# Patient Record
Sex: Male | Born: 1945 | Race: Black or African American | Hispanic: No | Marital: Married | Smoking: Former smoker
Health system: Southern US, Community
[De-identification: ages and names within clinical notes are randomized; demographics above are authoritative.]

## PROBLEM LIST (undated history)

## (undated) DIAGNOSIS — I10 Essential (primary) hypertension: Secondary | ICD-10-CM

## (undated) DIAGNOSIS — C159 Malignant neoplasm of esophagus, unspecified: Principal | ICD-10-CM

## (undated) DIAGNOSIS — I639 Cerebral infarction, unspecified: Secondary | ICD-10-CM

## (undated) DIAGNOSIS — E785 Hyperlipidemia, unspecified: Secondary | ICD-10-CM

## (undated) HISTORY — DX: Malignant neoplasm of esophagus, unspecified: C15.9

## (undated) HISTORY — PX: TONSILLECTOMY: SUR1361

## (undated) HISTORY — PX: PEG TUBE PLACEMENT: SUR1034

---

## 2000-07-17 ENCOUNTER — Emergency Department (HOSPITAL_COMMUNITY): Admission: EM | Admit: 2000-07-17 | Discharge: 2000-07-17 | Payer: Self-pay | Admitting: *Deleted

## 2012-09-06 ENCOUNTER — Encounter (HOSPITAL_COMMUNITY): Payer: Self-pay

## 2012-09-06 ENCOUNTER — Inpatient Hospital Stay (HOSPITAL_COMMUNITY): Payer: Medicare Other

## 2012-09-06 ENCOUNTER — Inpatient Hospital Stay (HOSPITAL_COMMUNITY)
Admission: EM | Admit: 2012-09-06 | Discharge: 2012-09-11 | DRG: 065 | Disposition: A | Discharge status: Skilled Nursing Facility | Payer: Medicare Other | Attending: Internal Medicine | Admitting: Internal Medicine

## 2012-09-06 ENCOUNTER — Emergency Department (HOSPITAL_COMMUNITY): Payer: Medicare Other

## 2012-09-06 ENCOUNTER — Other Ambulatory Visit: Payer: Self-pay

## 2012-09-06 DIAGNOSIS — F17200 Nicotine dependence, unspecified, uncomplicated: Secondary | ICD-10-CM | POA: Diagnosis present

## 2012-09-06 DIAGNOSIS — E538 Deficiency of other specified B group vitamins: Secondary | ICD-10-CM | POA: Diagnosis present

## 2012-09-06 DIAGNOSIS — I6350 Cerebral infarction due to unspecified occlusion or stenosis of unspecified cerebral artery: Principal | ICD-10-CM

## 2012-09-06 DIAGNOSIS — Z720 Tobacco use: Secondary | ICD-10-CM | POA: Diagnosis present

## 2012-09-06 DIAGNOSIS — G8100 Flaccid hemiplegia affecting unspecified side: Secondary | ICD-10-CM | POA: Diagnosis present

## 2012-09-06 DIAGNOSIS — F1010 Alcohol abuse, uncomplicated: Secondary | ICD-10-CM | POA: Diagnosis present

## 2012-09-06 DIAGNOSIS — I517 Cardiomegaly: Secondary | ICD-10-CM

## 2012-09-06 DIAGNOSIS — I639 Cerebral infarction, unspecified: Secondary | ICD-10-CM | POA: Diagnosis present

## 2012-09-06 DIAGNOSIS — H538 Other visual disturbances: Secondary | ICD-10-CM

## 2012-09-06 DIAGNOSIS — I10 Essential (primary) hypertension: Secondary | ICD-10-CM | POA: Diagnosis present

## 2012-09-06 LAB — CBC WITH DIFFERENTIAL/PLATELET
Eosinophils Relative: 0 % (ref 0–5)
Hemoglobin: 15.7 g/dL (ref 13.0–17.0)
Lymphocytes Relative: 15 % (ref 12–46)
Lymphs Abs: 1.3 10*3/uL (ref 0.7–4.0)
MCV: 103.9 fL — ABNORMAL HIGH (ref 78.0–100.0)
Monocytes Relative: 12 % (ref 3–12)
Neutrophils Relative %: 73 % (ref 43–77)
Platelets: 232 10*3/uL (ref 150–400)
RBC: 4.39 MIL/uL (ref 4.22–5.81)
WBC: 8.3 10*3/uL (ref 4.0–10.5)

## 2012-09-06 LAB — BASIC METABOLIC PANEL
CO2: 27 mEq/L (ref 19–32)
Glucose, Bld: 131 mg/dL — ABNORMAL HIGH (ref 70–99)
Potassium: 3.9 mEq/L (ref 3.5–5.1)
Sodium: 139 mEq/L (ref 135–145)

## 2012-09-06 LAB — PHOSPHORUS: Phosphorus: 3.3 mg/dL (ref 2.3–4.6)

## 2012-09-06 LAB — CK: Total CK: 280 U/L — ABNORMAL HIGH (ref 7–232)

## 2012-09-06 LAB — RAPID URINE DRUG SCREEN, HOSP PERFORMED
Cocaine: NOT DETECTED
Opiates: NOT DETECTED
Tetrahydrocannabinol: NOT DETECTED

## 2012-09-06 LAB — GLUCOSE, CAPILLARY
Glucose-Capillary: 117 mg/dL — ABNORMAL HIGH (ref 70–99)
Glucose-Capillary: 169 mg/dL — ABNORMAL HIGH (ref 70–99)

## 2012-09-06 MED ORDER — LORAZEPAM 2 MG/ML IJ SOLN: 1.0000 mg | Freq: Four times a day (QID) | INTRAMUSCULAR | Status: AC | PRN

## 2012-09-06 MED ORDER — THIAMINE HCL 100 MG/ML IJ SOLN: 100.0000 mg | Freq: Every day | INTRAMUSCULAR | Status: DC

## 2012-09-06 MED ORDER — NICOTINE 7 MG/24HR TD PT24
7.0000 mg | MEDICATED_PATCH | Freq: Every day | TRANSDERMAL | Status: DC
  Administered 2012-09-06 – 2012-09-11 (×6): 7 mg via TRANSDERMAL
  Filled 2012-09-06 (×8): qty 1

## 2012-09-06 MED ORDER — ASPIRIN 325 MG PO TABS
325.0000 mg | ORAL_TABLET | Freq: Every day | ORAL | Status: DC
  Administered 2012-09-06 – 2012-09-11 (×6): 325 mg via ORAL
  Filled 2012-09-06 (×6): qty 1

## 2012-09-06 MED ORDER — HEPARIN SODIUM (PORCINE) 5000 UNIT/ML IJ SOLN
5000.0000 [IU] | Freq: Three times a day (TID) | INTRAMUSCULAR | Status: DC
  Administered 2012-09-06 – 2012-09-11 (×16): 5000 [IU] via SUBCUTANEOUS
  Filled 2012-09-06 (×16): qty 1

## 2012-09-06 MED ORDER — FOLIC ACID 1 MG PO TABS
1.0000 mg | ORAL_TABLET | Freq: Every day | ORAL | Status: DC
  Administered 2012-09-06 – 2012-09-11 (×6): 1 mg via ORAL
  Filled 2012-09-06 (×6): qty 1

## 2012-09-06 MED ORDER — LORAZEPAM 1 MG PO TABS
1.0000 mg | ORAL_TABLET | Freq: Four times a day (QID) | ORAL | Status: AC | PRN
  Administered 2012-09-07: 1 mg via ORAL
  Filled 2012-09-06: qty 1

## 2012-09-06 MED ORDER — SODIUM CHLORIDE 0.9 % IV SOLN
INTRAVENOUS | Status: DC
  Administered 2012-09-06 – 2012-09-10 (×5): via INTRAVENOUS

## 2012-09-06 MED ORDER — VITAMIN B-1 100 MG PO TABS
100.0000 mg | ORAL_TABLET | Freq: Every day | ORAL | Status: DC
  Administered 2012-09-06 – 2012-09-11 (×6): 100 mg via ORAL
  Filled 2012-09-06 (×6): qty 1

## 2012-09-06 MED ORDER — ADULT MULTIVITAMIN W/MINERALS CH
1.0000 | ORAL_TABLET | Freq: Every day | ORAL | Status: DC
  Administered 2012-09-06 – 2012-09-11 (×6): 1 via ORAL
  Filled 2012-09-06 (×6): qty 1

## 2012-09-06 NOTE — ED Notes (Signed)
Patient reports left sided weakness since awakening around 7 AM Tuesday morning. Patient reports he crawled around his house for two days. Daughter checks on patient every couple days and came to check on patient when a maintenance worker reported patient was in the floor of his home. Patient alert/oriented x 4. Speech slurred to daughter. Patient able to move fingers on left hand minimally, but not arm. Unable to move left leg. Patient with moderate to strong movement of right side. Denies any pain. Denies medical history. Hypertensive upon arrival.

## 2012-09-06 NOTE — H&P (Signed)
[facility] History and Physical  Billy Carson WUJ:[id number] DOB: 12/17/1945 DOA: 09/06/2012  Referring physician: Dr. Gwendolyn Carson PCP: Default, Provider, MD  Specialists: Neurology  Chief Complaint: Left-sided weakness  HPI: Billy Carson is a 67 y.o. male without known medical history, mainly because he does not see a doctor frequently, presents to the emergency room with a chief complaint of left-sided weakness for the past 2 days. States that 2 days ago he has noticed that he is unable to move his left arm and left leg. He did not seek help at that time and only talked about his symptoms with his wife. When he saw that his symptoms are not resolving, decided to present to the emergency room. He denies any past medical history, he does not have a primary care doctor. He is not taking any medications on a chronic basis at home. He has a history of tobacco abuse, has been smoking about a pack every 3 days for his life. Has a history of alcohol use, has been drinking a pint of vodka for all his life. Never had symptoms like this in the past. He denies any headaches, lightheadedness or dizziness. He denies any chest pain. He denies any shortness or breath. He denies any nausea vomiting or diarrhea. For all these started, he was pretty active without any deficits. In the emergency room, he underwent a CT scan of the head which was suspicious for subacute infarct in the lateral right basal ganglia without evidence of acute hemorrhage  Review of Systems: As per history of present illness, otherwise negative  History reviewed. No pertinent past medical history. History reviewed. No pertinent past surgical history. Social History:  reports that he has been smoking Cigarettes.  He has been smoking about 0.00 packs per day. He does not have any smokeless tobacco history on file. He reports that  drinks alcohol. He reports that he does not use illicit drugs.  No Known Allergies  Family  history noncontributory.  Prior to Admission medications   Not on File   Physical Exam: Filed Vitals:   09/06/12 1231  BP: 195/134  Pulse: 107  Temp: 99.6 F (37.6 C)  TempSrc: Oral  Resp: 18  Height: 6' (1.829 m)  Weight: 114.76 kg (253 lb)  SpO2: 99%     General:  No apparent distress, pleasant African American male  Eyes: PERRL, EOMI, no scleral icterus  ENT: moist oropharynx  Neck: supple, no JVD  Cardiovascular: regular rate without MRG; 2+ peripheral pulses  Respiratory: CTA biL, good air movement without wheezing, rhonchi or crackled  Abdomen: soft, non tender to palpation, positive bowel sounds, no guarding, no rebound  Skin: no rashes  Musculoskeletal: no peripheral edema  Psychiatric: normal mood and affect  Neurologic: Significant left upper and left lower extremity weakness, 1/5. No facial droop appreciated. Minimal if any dysarthria. Right-sided strength 5 out of 5.  Labs on Admission:  Basic Metabolic Panel:  Recent Labs Lab 09/06/12 1238  NA 139  K 3.9  CL 94*  CO2 27  GLUCOSE 131*  BUN 17  CREATININE 1.04  CALCIUM 10.3   CBC:  Recent Labs Lab 09/06/12 1238  WBC 8.3  NEUTROABS 6.1  HGB 15.7  HCT 45.6  MCV 103.9*  PLT 232   Cardiac Enzymes:  Recent Labs Lab 09/06/12 1238  CKTOTAL 280*  TROPONINI <0.30    Radiological Exams on Admission: Ct Head Wo Contrast  09/06/2012   *RADIOLOGY REPORT*  Clinical Data: Left-sided weakness.  CT HEAD WITHOUT CONTRAST  Technique:  Contiguous axial images were obtained from the base of the skull through the vertex without contrast.  Comparison: None.  Findings: There are scattered low density areas in the basal ganglia and white matter tracts bilaterally.  There is a rounded low density area in the lateral right basal ganglia which is age indeterminate and could represent a subacute infarct.  Extensive low density in the subcortical and periventricular white matter suggests chronic small  vessel disease.  There is no evidence for acute hemorrhage, mass lesion, midline shift or hydrocephalus. Visualized sinuses are clear.  No acute bony abnormality.  IMPRESSION: Suspicious low density in the lateral right basal ganglia. Findings may represent a subacute infarct.  No evidence for acute hemorrhage.  Scattered low density areas in the left basal ganglia may represent age indeterminate lacunar infarcts.  In addition, the patient has evidence for chronic small vessel ischemic changes.  These results were called by telephone on 09/06/2012 at 1:16 p.m. to Dr. Gwendolyn Carson, who verbally acknowledged these results.   Original Report Authenticated By: Richarda Overlie, M.D.    EKG: Independently reviewed.  Assessment/Plan Active Problems:   CVA (cerebral infarction)   Tobacco abuse   Alcohol abuse   Hypertension    Left-sided weakness - Likely due to CVA as evidenced by the CT findings. We'll admit to telemetry, pursue CVA workup including MRI, 2-D echo, carotid Doppler evaluation, lipid panel, hemoglobin A1c. Neurology consult. On aspirin. - PT/OT evaluation. SLP evaluation.  Hypertension - Allow permissive hypertension for couple days  Tobacco abuse - nicotine patch  Alcohol abuse - Last drink 3 days ago on Monday. Closely monitor. On CIWA  Elevated MCV - Will check vitamin B12 and folate.  DVT prophylaxis - Heparin subcutaneous   Code Status: Presumed full  Family Communication: None  Disposition Plan: Inpatient  Time spent: 14  Billy Carson M. Elvera Lennox, MD [facility] Pager [phone]  If 7PM-7AM, please contact night-coverage [email or web address] Password [facility] 09/06/2012, 2:48 PM

## 2012-09-06 NOTE — Progress Notes (Signed)
*  PRELIMINARY RESULTS* Echocardiogram 2D Echocardiogram has been performed.  Billy Carson 09/06/2012, 3:40 PM

## 2012-09-06 NOTE — ED Notes (Signed)
Pt arrived from home by ems, was found today by daughter in the floor around 12noon.  Pt stated his left side has been weak since around 7am on Tuesday am.  Pt alert and able to answer all ?'s.  Denies any difficulty swallowing or breathing.

## 2012-09-06 NOTE — ED Provider Notes (Signed)
CSN: [id number]     Arrival date & time 09/06/12  1233 History  This chart was scribed for Dagmar Hait, MD, by Yevette Edwards, ED Scribe. This patient was seen in room APA01/APA01 and the patient's care was started at 12:39 PM.   First MD Initiated Contact with Patient 09/06/12 1235     Chief Complaint  Patient presents with  . Extremity Weakness  . Hypertension   (Consider location/radiation/quality/duration/timing/severity/associated sxs/prior Treatment) Patient is a 67 y.o. male presenting with extremity weakness and hypertension. The history is provided by the patient and a relative. No language interpreter was used.  Extremity Weakness This is a new problem. The current episode started 2 days ago. The problem occurs constantly. The problem has not changed since onset.Pertinent negatives include no chest pain, no abdominal pain, no headaches and no shortness of breath. Nothing aggravates the symptoms. Nothing relieves the symptoms.  Hypertension Pertinent negatives include no chest pain, no abdominal pain, no headaches and no shortness of breath.   HPI Comments: Billy Carson is a 67 y.o. male who presents to the Emergency Department complaining of weakness to his left side which began suddenly two days ago. The pt reports that he fell to his knees once the weakness began, but he denies hitting his head or LOC. He has experienced a diminished ability to eat.  The pt denies a headache, pain to his left side, chest pain, or SOB. He was found on the floor by his daughter. The pt denies aspirin or coumadin.   History reviewed. No pertinent past medical history. History reviewed. No pertinent past surgical history. No family history on file. History  Substance Use Topics  . Smoking status: Current Every Day Smoker    Types: Cigarettes  . Smokeless tobacco: Not on file  . Alcohol Use: Yes     Comment: 6 pack a day    Review of Systems  Constitutional: Positive for  appetite change.  Respiratory: Negative for shortness of breath.   Cardiovascular: Negative for chest pain.  Gastrointestinal: Negative for abdominal pain.  Musculoskeletal: Positive for extremity weakness.  Neurological: Positive for weakness. Negative for syncope and headaches.  All other systems reviewed and are negative.    Allergies  Review of patient's allergies indicates not on file.  Home Medications  No current outpatient prescriptions on file.  Triage Vitals: BP 195/134  Pulse 107  Temp(Src) 99.6 F (37.6 C) (Oral)  Resp 18  Ht 6' (1.829 m)  Wt 253 lb (114.76 kg)  BMI 34.31 kg/m2  SpO2 99%  Physical Exam  Nursing note and vitals reviewed. Constitutional: He is oriented to person, place, and time. He appears well-developed and well-nourished. No distress.  HENT:  Head: Normocephalic and atraumatic.  Eyes: EOM are normal.  Neck: Neck supple. No tracheal deviation present.  Cardiovascular: Normal rate.   Pulmonary/Chest: Effort normal. No respiratory distress.  Musculoskeletal: Normal range of motion.  Left arm and leg flaccid paralysis.   Neurological: He is alert and oriented to person, place, and time.  Skin: Skin is warm and dry.  Psychiatric: He has a normal mood and affect. His behavior is normal.    ED Course  Procedures (including critical care time)  DIAGNOSTIC STUDIES:  Oxygen Saturation is 99% on room air, normal by my interpretation.    COORDINATION OF CARE:  12:44 PM- Discussed treatment plan with patient, and the patient agreed to the plan.   Labs Review Labs Reviewed  CBC WITH DIFFERENTIAL - Abnormal;  Notable for the following:    MCV 103.9 (*)    MCH 35.8 (*)    All other components within normal limits  BASIC METABOLIC PANEL - Abnormal; Notable for the following:    Chloride 94 (*)    Glucose, Bld 131 (*)    GFR calc non Af Amer 71 (*)    GFR calc Af Amer 83 (*)    All other components within normal limits  CK - Abnormal;  Notable for the following:    Total CK 280 (*)    All other components within normal limits  TROPONIN I   Imaging Review Ct Head Wo Contrast  09/06/2012   *RADIOLOGY REPORT*  Clinical Data: Left-sided weakness.  CT HEAD WITHOUT CONTRAST  Technique:  Contiguous axial images were obtained from the base of the skull through the vertex without contrast.  Comparison: None.  Findings: There are scattered low density areas in the basal ganglia and white matter tracts bilaterally.  There is a rounded low density area in the lateral right basal ganglia which is age indeterminate and could represent a subacute infarct.  Extensive low density in the subcortical and periventricular white matter suggests chronic small vessel disease.  There is no evidence for acute hemorrhage, mass lesion, midline shift or hydrocephalus. Visualized sinuses are clear.  No acute bony abnormality.  IMPRESSION: Suspicious low density in the lateral right basal ganglia. Findings may represent a subacute infarct.  No evidence for acute hemorrhage.  Scattered low density areas in the left basal ganglia may represent age indeterminate lacunar infarcts.  In addition, the patient has evidence for chronic small vessel ischemic changes.  These results were called by telephone on 09/06/2012 at 1:16 p.m. to Dr. Gwendolyn Grant, who verbally acknowledged these results.   Original Report Authenticated By: Richarda Overlie, M.D.    MDM   1. Stroke   2. Left flaccid hemiplegia    48M presents with L sided paralysis, onset 2 days ago. Found down by maintenance today. Mild hypertension here, mild tachycardia. On exam, L arm and leg flaccid paralysis. CT of head shows R basal ganglia stroke, reviewed by me and I spoke with the radiologist concerning the read. Admitted to medicine.   I personally performed the services described in this documentation, which was scribed in my presence. The recorded information has been reviewed and is accurate.     Dagmar Hait, MD 09/06/12 1447

## 2012-09-06 NOTE — ED Notes (Signed)
Report given to Karen,RN unit 300. Ready to receive patient. 

## 2012-09-07 LAB — BASIC METABOLIC PANEL
CO2: 27 mEq/L (ref 19–32)
Calcium: 9.7 mg/dL (ref 8.4–10.5)
Sodium: 140 mEq/L (ref 135–145)

## 2012-09-07 LAB — LIPID PANEL
HDL: 57 mg/dL (ref >39)
LDL Cholesterol: 90 mg/dL (ref 0–99)

## 2012-09-07 LAB — FOLATE RBC: RBC Folate: 203 ng/mL — ABNORMAL LOW (ref >=366)

## 2012-09-07 LAB — CBC
MCV: 104.8 fL — ABNORMAL HIGH (ref 78.0–100.0)
Platelets: 224 10*3/uL (ref 150–400)
RBC: 4.13 MIL/uL — ABNORMAL LOW (ref 4.22–5.81)
WBC: 7.7 10*3/uL (ref 4.0–10.5)

## 2012-09-07 LAB — HEMOGLOBIN A1C
Hgb A1c MFr Bld: 5.7 % — ABNORMAL HIGH (ref <5.7)
Mean Plasma Glucose: 117 mg/dL — ABNORMAL HIGH (ref <117)

## 2012-09-07 LAB — GLUCOSE, CAPILLARY
Glucose-Capillary: 122 mg/dL — ABNORMAL HIGH (ref 70–99)
Glucose-Capillary: 120 mg/dL — ABNORMAL HIGH (ref 70–99)
Glucose-Capillary: 110 mg/dL — ABNORMAL HIGH (ref 70–99)

## 2012-09-07 MED ORDER — CYANOCOBALAMIN 1000 MCG/ML IJ SOLN
1000.0000 ug | Freq: Once | INTRAMUSCULAR | Status: AC
  Administered 2012-09-07: 1000 ug via INTRAMUSCULAR
  Filled 2012-09-07: qty 1

## 2012-09-07 MED ORDER — POTASSIUM CHLORIDE CRYS ER 20 MEQ PO TBCR
30.0000 meq | EXTENDED_RELEASE_TABLET | Freq: Once | ORAL | Status: AC
  Administered 2012-09-07: 30 meq via ORAL
  Filled 2012-09-07: qty 1

## 2012-09-07 NOTE — Evaluation (Signed)
Clinical/Bedside Swallow Evaluation Patient Details  Name: JAYLYN BOOHER MRN: [id number] Date of Birth: 11-22-1945  Today's Date: 09/07/2012 Time: 4098-1191 SLP Time Calculation (min): 10 min  Past Medical History: History reviewed. No pertinent past medical history. Past Surgical History: History reviewed. No pertinent past surgical history. HPI:  MELO STAUBER is a 67 y.o. male without known medical history, mainly because he does not see a doctor frequently, presents to the emergency room with a chief complaint of left-sided weakness for the past 2 days. States that 2 days ago he has noticed that he is unable to move his left arm and left leg. He did not seek help at that time and only talked about his symptoms with his wife. When he saw that his symptoms are not resolving, decided to present to the emergency room. He denies any past medical history, he does not have a primary care doctor. He is not taking any medications on a chronic basis at home. He has a history of tobacco abuse, has been smoking about a pack every 3 days for his life. Has a history of alcohol use, has been drinking a pint of vodka for all his life. Never had symptoms like this in the past. He denies any headaches, lightheadedness or dizziness. He denies any chest pain. He denies any shortness or breath. He denies any nausea vomiting or diarrhea. For all these started, he was pretty active without any deficits. In the emergency room, he underwent a CT scan of the head which was suspicious for subacute infarct in the lateral right basal ganglia without evidence of acute hemorrhage   Assessment / Plan / Recommendation Clinical Impression  Pt seen for BSE today. He was pleasant and cooperative throughout eval. He stated that before he came to [facility], he consumed regular diet with meats chopped (due to edentulous status) and thin liquids.  Provided ice chips, thin liquids, mech soft textures, and solid textures, pt  demonstrated no overt s/sx of aspiration and/or penetration and presented adequate self-monitoring during feeding. He swallowed each texture timely and effectively. Recommend mech soft diet with thin liquids due to pt edentulous.     Aspiration Risk  Mild    Diet Recommendation Dysphagia 3 (Mechanical Soft);Thin liquid   Liquid Administration via: Cup;Straw Medication Administration: Whole meds with liquid Supervision: Patient able to self feed (Pt needs some assist due to no movement in left side. ) Compensations: Slow rate;Small sips/bites    Other  Recommendations Oral Care Recommendations: Oral care BID   Follow Up Recommendations  None       Pertinent Vitals/Pain Pt reported no pain.     SLP Swallow Goals  N/A   Swallow Study Prior Functional Status   Pt consuming regular diet with thin liquids. Pt reported that he chopped meats because he could not chew them well without teeth.     General Date of Onset: 09/07/12 HPI: DEVARIO BUCKLEW is a 67 y.o. male without known medical history, mainly because he does not see a doctor frequently, presents to the emergency room with a chief complaint of left-sided weakness for the past 2 days. States that 2 days ago he has noticed that he is unable to move his left arm and left leg. He did not seek help at that time and only talked about his symptoms with his wife. When he saw that his symptoms are not resolving, decided to present to the emergency room. He denies any past medical history, he does  not have a primary care doctor. He is not taking any medications on a chronic basis at home. He has a history of tobacco abuse, has been smoking about a pack every 3 days for his life. Has a history of alcohol use, has been drinking a pint of vodka for all his life. Never had symptoms like this in the past. He denies any headaches, lightheadedness or dizziness. He denies any chest pain. He denies any shortness or breath. He denies any nausea  vomiting or diarrhea. For all these started, he was pretty active without any deficits. In the emergency room, he underwent a CT scan of the head which was suspicious for subacute infarct in the lateral right basal ganglia without evidence of acute hemorrhage Type of Study: Bedside swallow evaluation Diet Prior to this Study: Regular;Thin liquids Temperature Spikes Noted: No Respiratory Status: Room air Behavior/Cognition: Alert;Cooperative;Pleasant mood Oral Cavity - Dentition: Edentulous Self-Feeding Abilities: Able to feed self;Needs assist Patient Positioning: Upright in bed Baseline Vocal Quality: Clear Volitional Cough: Weak Volitional Swallow: Able to elicit    Oral/Motor/Sensory Function Overall Oral Motor/Sensory Function: Appears within functional limits for tasks assessed   Ice Chips Ice chips: Within functional limits Presentation: Spoon;Self Fed   Thin Liquid Thin Liquid: Within functional limits Presentation: Cup;Spoon;Self Fed    Nectar Thick Nectar Thick Liquid: Not tested   Honey Thick Honey Thick Liquid: Not tested   Puree Puree: Within functional limits Presentation: Spoon;Self Fed   Solid   GO    Solid: Within functional limits Presentation: Self Fed;Spoon       Seymone Forlenza S 09/07/2012,10:33 AM

## 2012-09-07 NOTE — Plan of Care (Signed)
Problem: Phase I Progression Outcomes Goal: Initial discharge plan identified Outcome: Completed/Met Date Met:  09/07/12 Plan to DC to SNF at this time

## 2012-09-07 NOTE — Evaluation (Signed)
Occupational Therapy Evaluation Patient Details Name: Billy Carson MRN: [id number] DOB: 05-18-1945 Today's Date: 09/07/2012 Time: 4098-1191 OT Time Calculation (min): 25 min OT Eval OT Assessment / Plan / Recommendation History of present illness Pt is admitted with subacte right basal ganglia infarct.  He describes left sided weakness over the past few days.  He rarely sees and MD and had been in seemingly good health PTA.  He is the primary CG of his invalid wife who is w/c dependent.     OT Assessment  Patient needs continued OT Services    Follow Up Recommendations  SNF    Barriers to Discharge   patient is primary caregiver for wife, will need care himself  Equipment Recommendations       Recommendations for Other Services    Frequency  Min 2X/week    Precautions / Restrictions Precautions Precautions: Fall Restrictions Weight Bearing Restrictions: No Other Position/Activity Restrictions: Pt will not be able to bear much weight on the LLE due to profound weakness in that extremity       ADL  Eating/Feeding: Performed;Set up (using right hand) Where Assessed - Eating/Feeding: Bed level    OT Diagnosis: Generalized weakness;Hemiplegia non-dominant side  OT Problem List: Decreased strength;Decreased range of motion;Decreased activity tolerance;Impaired balance (sitting and/or standing);Decreased coordination;Decreased safety awareness;Impaired sensation OT Treatment Interventions: Self-care/ADL training;Therapeutic exercise;Neuromuscular education;Therapeutic activities;Balance training   OT Goals(Current goals can be found in the care plan section) Acute Rehab OT Goals Patient Stated Goal: none stated ADL Goals Pt Will Perform Grooming: with set-up;sitting Pt Will Perform Upper Body Bathing: with mod assist;bed level Pt Will Perform Upper Body Dressing: with mod assist;sitting Additional ADL Goal #1: Patient will use LUE to weightbear during transfers with max pa  at elbow and hand.   Visit Information  Last OT Received On: 09/07/12 History of Present Illness: Pt is admitted with subacte right basal ganglia infarct.  He describes left sided weakness over the past few days.  He rarely sees and MD and had been in seemingly good health PTA.  He is the primary CG of his invalid wife who is w/c dependent.       Prior Functioning     Home Living Family/patient expects to be discharged to:: Skilled nursing facility Living Arrangements: Spouse/significant other Additional Comments: Pt is a CG for his wife who is w/c dependent Prior Function Level of Independence: Independent Communication Communication: No difficulties         Vision/Perception Vision - History Baseline Vision: No visual deficits Patient Visual Report: No change from baseline Vision - Assessment Eye Alignment: Within Functional Limits Perception Perception: Within Functional Limits   Cognition  Cognition Arousal/Alertness: Awake/alert Behavior During Therapy: WFL for tasks assessed/performed Overall Cognitive Status: Within Functional Limits for tasks assessed    Extremity/Trunk Assessment Upper Extremity Assessment Upper Extremity Assessment: LUE deficits/detail LUE Deficits / Details: 0 AROM of LUE.  PROM of LUE is WFL and pain free.  Brunnstrom stage 1. LUE Sensation:  (light touch sensation is WFL) LUE Coordination: decreased fine motor;decreased gross motor Lower Extremity Assessment Lower Extremity Assessment: Defer to PT evaluation LLE Deficits / Details: pt has 1/5 strength in hip, knee...no power at the ankle....no increased tone noted     Mobility Bed Mobility Bed Mobility: Supine to Sit Supine to Sit: HOB elevated;3: Mod assist Transfers Details for Transfer Assistance: pt is able to bear minimal weight on the LLE     Exercise     Balance Balance Balance Assessed:  Yes Static Sitting Balance Static Sitting - Balance Support: Right upper extremity  supported;Feet supported Static Sitting - Level of Assistance: 3: Mod assist Static Sitting - Comment/# of Minutes: pt sat for 5 minutes, initiated PNF for rhythmic stabilization  to trunk and pt was  able to initiate some muscle contraction on the left   End of Session OT - End of Session Activity Tolerance: Patient tolerated treatment well Patient left: in bed;with call bell/phone within reach;with bed alarm set;with family/visitor present  GO     Shirlean Mylar, OTR/L  09/07/2012, 12:25 PM

## 2012-09-07 NOTE — Clinical Social Work Note (Signed)
Daughter, Wende Crease, is POA.  Was given bed offers, only bed offered at this point is [facility].  Says family will accept bed at [facility], daughter asked to call admissions.  CSW also left message for [facility] admissions to contact daughter to complete preadmission paperwork.  Santa Genera, LCSW Clinical Social Worker [phone])

## 2012-09-07 NOTE — Evaluation (Signed)
Physical Therapy Evaluation Patient Details Name: Billy Carson MRN: [id number] DOB: 28-Jul-1945 Today's Date: 09/07/2012 Time: 0922 (time interrupted by speech therapist for about 10 min)-1005 PT Time Calculation (min): 43 min  PT Assessment / Plan / Recommendation History of Present Illness  Pt is admitted with subacte right basal ganglia infarct.  He describes left sided weakness over the past few days.  He rarely sees and MD and had been in seemingly good health PTA.  He is the primary CG of his invalid wife who is w/c dependent.  Clinical Impression   Pt was seen for evaluation.  He is very pleasant and well oriented, no obvious facial weakness.  He does state that he has minimal vision in the right eye over the past 6 months, etiology unknown.  He has good awareness of the left side but profound weakness in the left extremeties and trunk.  No increased tone was noted at this point.  His sitting balance is poor and he falls to the left.  He was able to transfer bed to chair with moderate assistance and was able to minimally weight bear on the left.  He will not be appropriate for  CIR as he does not have a good discharge plan.  He will definitely need rehab at [facility].    PT Assessment  Patient needs continued PT services    Follow Up Recommendations  SNF    Does the patient have the potential to tolerate intense rehabilitation      Barriers to Discharge Decreased caregiver support      Equipment Recommendations  Other (comment) Psychologist, educational)    Recommendations for Other Services OT consult   Frequency Min 6X/week    Precautions / Restrictions Precautions Precautions: Fall Restrictions Weight Bearing Restrictions: No Other Position/Activity Restrictions: Pt will not be able to bear much weight on the LLE due to profound weakness in that extremity   Pertinent Vitals/Pain       Mobility  Bed Mobility Bed Mobility: Supine to Sit Supine to Sit: HOB elevated;3: Mod  assist Transfers Transfers: Stand Pivot Transfers Stand Pivot Transfers: 3: Mod assist Details for Transfer Assistance: pt is able to bear minimal weight on the LLE Ambulation/Gait Ambulation/Gait Assistance: Not tested (comment)    Exercises     PT Diagnosis: Hemiplegia non-dominant side  PT Problem List: Decreased strength;Decreased balance;Decreased mobility;Decreased knowledge of use of DME;Cardiopulmonary status limiting activity PT Treatment Interventions: Functional mobility training;Therapeutic activities;Therapeutic exercise;Neuromuscular re-education;Balance training;Patient/family education     PT Goals(Current goals can be found in the care plan section) Acute Rehab PT Goals Patient Stated Goal: none stated PT Goal Formulation: With patient Time For Goal Achievement: 09/21/12 Potential to Achieve Goals: Fair  Visit Information  Last PT Received On: 09/07/12 History of Present Illness: Pt is admitted with subacte right basal ganglia infarct.  He describes left sided weakness over the past few days.  He rarely sees and MD and had been in seemingly good health PTA.  He is the primary CG of his invalid wife who is w/c dependent.       Prior Functioning  Home Living Family/patient expects to be discharged to:: Skilled nursing facility Additional Comments: Pt is a CG for his wife who is w/c dependent Prior Function Level of Independence: Independent Communication Communication: No difficulties    Cognition  Cognition Arousal/Alertness: Awake/alert Behavior During Therapy: WFL for tasks assessed/performed Overall Cognitive Status: Within Functional Limits for tasks assessed    Extremity/Trunk Assessment Upper Extremity Assessment Upper  Extremity Assessment: Defer to OT evaluation (on gross exam, LUE is flaccid) Lower Extremity Assessment Lower Extremity Assessment: LLE deficits/detail LLE Deficits / Details: pt has 1/5 strength in hip, knee...no power at the  ankle....no increased tone noted   Balance Balance Balance Assessed: Yes Static Sitting Balance Static Sitting - Balance Support: Right upper extremity supported;Feet supported Static Sitting - Level of Assistance: 3: Mod assist Static Sitting - Comment/# of Minutes: pt sat for 5 minutes, initiated PNF for rhythmic stabilization  to trunk and pt was  able to initiate some muscle contraction on the left  End of Session PT - End of Session Equipment Utilized During Treatment: Gait belt Activity Tolerance: Patient tolerated treatment well Patient left: in chair;with call bell/phone within reach;with chair alarm set;with family/visitor present Nurse Communication: Mobility status  GP     Konrad Penta 09/07/2012, 11:04 AM

## 2012-09-07 NOTE — Clinical Social Work Psychosocial (Signed)
    Clinical Social Work Department BRIEF PSYCHOSOCIAL ASSESSMENT 09/07/2012  Patient:  Billy Carson, Billy Carson     Account Number:  [id number]     Admit date:  09/06/2012  Clinical Social Worker:  Santa Genera, CLINICAL SOCIAL WORKER  Date/Time:  09/07/2012 11:00 AM  Referred by:  Care Management  Date Referred:  09/07/2012 Referred for  SNF Placement   Other Referral:   Interview type:  Patient Other interview type:   Also spoke w  family in room w patient consent    PSYCHOSOCIAL DATA Living Status:  FAMILY Admitted from facility:   Level of care:   Primary support name:  Wende Crease Primary support relationship to patient:  CHILD, ADULT Degree of support available:   Limited, wife in wheelchair and unable to meet patient care needs.  Daughter checks on parents but does not live in home    CURRENT CONCERNS Current Concerns  Post-Acute Placement   Other Concerns:    SOCIAL WORK ASSESSMENT / PLAN CSW met w patient at bedside, patient alert and oriented x4.  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  Wife has health issues, currently in wheelchair. Per daughter, she is POA for patient.  Patient and family agreeable to SNF placement due to rehab needs.  Prefer [facility] if possible.  Daughter expressed concern about both patient and her mother who is not a patient.  Says mother is not eating, is confined to wheelchair and is unable to care for herself, cannot assist w patient care in any way. Daughter has spoken w DSS regarding issues w mother, was advised to speak w [facility] CSW.  As mother is not a patient, CSW advised that we can provide lists of facilities but cannot place someone who is not our patient.    CSW will seek bed offers for patient and work w family and patient.   Assessment/plan status:  Psychosocial Support/Ongoing Assessment of Needs Other assessment/ plan:   Information/referral to community resources:   [employer] list    PATIENT'S/FAMILY'S  RESPONSE TO PLAN OF CARE: Daughter wants help w placing mother, CSW will provide information but cannot assist.  Daughter has spoken w DSS regarding concerns about mother.  Patient and family agreeable to SNF rehab.        Santa Genera, LCSW Clinical Social Worker [phone])

## 2012-09-07 NOTE — Clinical Social Work Placement (Signed)
    Clinical Social Work Department CLINICAL SOCIAL WORK PLACEMENT NOTE 09/07/2012  Patient:  Billy Carson, Billy Carson  Account Number:  [id number] Admit date:  09/06/2012  Clinical Social Worker:  Santa Genera, CLINICAL SOCIAL WORKER  Date/time:  09/07/2012 11:15 AM  Clinical Social Work is seeking post-discharge placement for this patient at the following level of care:   SKILLED NURSING   (*CSW will update this form in Epic as items are completed)   09/07/2012  Patient/family provided with [facility] Department of Clinical Social Work's list of facilities offering this level of care within the geographic area requested by the patient (or if unable, by the patient's family).  09/07/2012  Patient/family informed of their freedom to choose among providers that offer the needed level of care, that participate in Medicare, Medicaid or managed care program needed by the patient, have an available bed and are willing to accept the patient.  09/07/2012  Patient/family informed of [facility]' ownership interest in [facility], as well as of the fact that they are under no obligation to receive care at this facility.  PASARR submitted to [employer] on 09/07/2012 PASARR number received from [employer] on 09/07/2012  FL2 transmitted to all facilities in geographic area requested by pt/family on  09/07/2012 FL2 transmitted to all facilities within larger geographic area on   Patient informed that his/her managed care company has contracts with or will negotiate with  certain facilities, including the following:     Patient/family informed of bed offers received:  09/07/2012 Patient chooses bed at [facility] Physician recommends and patient chooses bed at    Patient to be transferred to  on   Patient to be transferred to facility by   The following physician request were entered in Epic:   Additional Comments:  Santa Genera, LCSW Clinical Social Worker [phone])

## 2012-09-07 NOTE — Consult Note (Signed)
[facility] Vickye Astorino A. Gerilyn Pilgrim, MD     [email or web address]          Billy Carson is an 67 y.o. male.   ASSESSMENT/PLAN: 1. Acute infarct involving the lentiform nucleus on the right side.  2. Multiple bilateral lacunar infarcts likely due to untreated hypertension. 3. Untreated hypertension. 4. Significant chronic ischemia wet matter changes due to untreated hypertension.  The patient is a 67 year old black male who probably has been relatively healthy per the patient. However, it appears that he has not seen a physician in a long time. He takes no medications. The patient presents with a 2 day history of significant weakness involving the left side. Patient decided to seek medical attention as a problem did not improve. Patient does not report having any other similar symptoms in the past. He denies significant dysarthria or dysphasia. There is no chest pain, shortness of breath or other symptoms. There is no headaches. The review of systems otherwise negative.  GENERAL: This is a pleasant thin man in no acute distress.  HEENT: The patient has a rather large dense cataract on the right. There is also significant cataract on the left side. Neck is supple and head is normocephalic atraumatic.  ABDOMEN: soft  EXTREMITIES: No edema   BACK: Unremarkable.  SKIN: Normal by inspection.    MENTAL STATUS: Alert and oriented. Speech, language and cognition are generally intact. Judgment and insight normal.   CRANIAL NERVES: Pupils are equal, round and reactive to light and accommodation; extra ocular movements are full, there is no significant nystagmus; visual fields are full; upper and lower facial muscles are normal in strength and symmetric, there is no flattening of the nasolabial folds; tongue is midline; uvula is midline; shoulder elevation is normal.  MOTOR: The patient is plegic in the left upper extremity and essentially also plegic in the left lower extremity. The  left hip flexion is 2/5 but other muscle groups of the left lower extremity 0/5 including dorsiflexion. The tone is slightly diminished. The right side shows normal tone, bulk and strength.  COORDINATION: The right finger to nose is normal, No rest tremor; no intention tremor; no postural tremor; no bradykinesia.  REFLEXES: Deep tendon reflexes 2+ on the right side and essentially 2+ on the left except at the left patella. Plantar responses are flexor bilaterally.   SENSATION: Normal to light touch and temperature.   History reviewed. No pertinent past medical history.  History reviewed. No pertinent past surgical history.  History reviewed. No pertinent family history.  Social History:  reports that he has been smoking Cigarettes.  He has been smoking about 0.00 packs per day. He does not have any smokeless tobacco history on file. He reports that  drinks alcohol. He reports that he does not use illicit drugs.  Allergies: No Known Allergies  Medications:  Blood pressure 145/95, pulse 77, temperature 97.8 F (36.6 C), temperature source Oral, resp. rate 18, height 6' (1.829 m), weight 80.468 kg (177 lb 6.4 oz), SpO2 100.00%.   Results for orders placed during the hospital encounter of 09/06/12 (from the past 48 hour(s))  CBC WITH DIFFERENTIAL     Status: Abnormal   Collection Time    09/06/12 12:38 PM      Result Value Range   WBC 8.3  4.0 - 10.5 K/uL   RBC 4.39  4.22 - 5.81 MIL/uL   Hemoglobin 15.7  13.0 - 17.0 g/dL   HCT 08.6  57.8 - 46.9 %  MCV 103.9 (*) 78.0 - 100.0 fL   MCH 35.8 (*) 26.0 - 34.0 pg   MCHC 34.4  30.0 - 36.0 g/dL   RDW 16.1  09.6 - 04.5 %   Platelets 232  150 - 400 K/uL   Neutrophils Relative % 73  43 - 77 %   Neutro Abs 6.1  1.7 - 7.7 K/uL   Lymphocytes Relative 15  12 - 46 %   Lymphs Abs 1.3  0.7 - 4.0 K/uL   Monocytes Relative 12  3 - 12 %   Monocytes Absolute 1.0  0.1 - 1.0 K/uL   Eosinophils Relative 0  0 - 5 %   Eosinophils Absolute 0.0  0.0 -  0.7 K/uL   Basophils Relative 0  0 - 1 %   Basophils Absolute 0.0  0.0 - 0.1 K/uL  BASIC METABOLIC PANEL     Status: Abnormal   Collection Time    09/06/12 12:38 PM      Result Value Range   Sodium 139  135 - 145 mEq/L   Potassium 3.9  3.5 - 5.1 mEq/L   Chloride 94 (*) 96 - 112 mEq/L   CO2 27  19 - 32 mEq/L   Glucose, Bld 131 (*) 70 - 99 mg/dL   BUN 17  6 - 23 mg/dL   Creatinine, Ser 4.09  0.50 - 1.35 mg/dL   Calcium 81.1  8.4 - 91.4 mg/dL   GFR calc non Af Amer 71 (*) >90 mL/min   GFR calc Af Amer 83 (*) >90 mL/min   Comment: (NOTE)     The eGFR has been calculated using the CKD EPI equation.     This calculation has not been validated in all clinical situations.     eGFR's persistently <90 mL/min signify possible Chronic Kidney     Disease.  TROPONIN I     Status: None   Collection Time    09/06/12 12:38 PM      Result Value Range   Troponin I <0.30  <0.30 ng/mL   Comment:            Due to the release kinetics of cTnI,     a negative result within the first hours     of the onset of symptoms does not rule out     myocardial infarction with certainty.     If myocardial infarction is still suspected,     repeat the test at appropriate intervals.  CK     Status: Abnormal   Collection Time    09/06/12 12:38 PM      Result Value Range   Total CK 280 (*) 7 - 232 U/L  HEMOGLOBIN A1C     Status: Abnormal   Collection Time    09/06/12 12:38 PM      Result Value Range   Hemoglobin A1C 5.7 (*) <5.7 %   Comment: (NOTE)                                                                               According to the ADA Clinical Practice Recommendations for 2011, when     HbA1c is used as a screening test:      >=  6.5%   Diagnostic of Diabetes Mellitus               (if abnormal result is confirmed)     5.7-6.4%   Increased risk of developing Diabetes Mellitus     References:Diagnosis and Classification of Diabetes Mellitus,Diabetes     Care,2011,34(Suppl 1):S62-S69 and  Standards of Medical Care in             Diabetes - 2011,Diabetes Care,2011,34 (Suppl 1):S11-S61.   Mean Plasma Glucose 117 (*) <117 mg/dL   Comment: Performed at [facility]  VITAMIN B12     Status: None   Collection Time    09/06/12  3:15 PM      Result Value Range   Vitamin B-12 282  211 - 911 pg/mL   Comment: Performed at [facility]  MAGNESIUM     Status: None   Collection Time    09/06/12  3:15 PM      Result Value Range   Magnesium 2.1  1.5 - 2.5 mg/dL  PHOSPHORUS     Status: None   Collection Time    09/06/12  3:15 PM      Result Value Range   Phosphorus 3.3  2.3 - 4.6 mg/dL  GLUCOSE, CAPILLARY     Status: Abnormal   Collection Time    09/06/12  6:11 PM      Result Value Range   Glucose-Capillary 117 (*) 70 - 99 mg/dL   Comment 1 Notify RN    GLUCOSE, CAPILLARY     Status: Abnormal   Collection Time    09/06/12  7:56 PM      Result Value Range   Glucose-Capillary 169 (*) 70 - 99 mg/dL  URINE RAPID DRUG SCREEN (HOSP PERFORMED)     Status: None   Collection Time    09/06/12  9:35 PM      Result Value Range   Opiates NONE DETECTED  NONE DETECTED   Cocaine NONE DETECTED  NONE DETECTED   Benzodiazepines NONE DETECTED  NONE DETECTED   Amphetamines NONE DETECTED  NONE DETECTED   Tetrahydrocannabinol NONE DETECTED  NONE DETECTED   Barbiturates NONE DETECTED  NONE DETECTED   Comment:            DRUG SCREEN FOR MEDICAL PURPOSES     ONLY.  IF CONFIRMATION IS NEEDED     FOR ANY PURPOSE, NOTIFY LAB     WITHIN 5 DAYS.                LOWEST DETECTABLE LIMITS     FOR URINE DRUG SCREEN     Drug Class       Cutoff (ng/mL)     Amphetamine      1000     Barbiturate      200     Benzodiazepine   200     Tricyclics       300     Opiates          300     Cocaine          300     THC              50  LIPID PANEL     Status: None   Collection Time    09/07/12  5:17 AM      Result Value Range   Cholesterol 172  0 - 200 mg/dL   Triglycerides 086  <578  mg/dL  HDL 57  >39 mg/dL   Total CHOL/HDL Ratio 3.0     VLDL 25  0 - 40 mg/dL   LDL Cholesterol 90  0 - 99 mg/dL   Comment:            Total Cholesterol/HDL:CHD Risk     Coronary Heart Disease Risk Table                         Men   Women      1/2 Average Risk   3.4   3.3      Average Risk       5.0   4.4      2 X Average Risk   9.6   7.1      3 X Average Risk  23.4   11.0                Use the calculated Patient Ratio     above and the CHD Risk Table     to determine the patient's CHD Risk.                ATP III CLASSIFICATION (LDL):      <100     mg/dL   Optimal      161-096  mg/dL   Near or Above                        Optimal      130-159  mg/dL   Borderline      045-409  mg/dL   High      >811     mg/dL   Very High  BASIC METABOLIC PANEL     Status: Abnormal   Collection Time    09/07/12  5:17 AM      Result Value Range   Sodium 140  135 - 145 mEq/L   Potassium 3.4 (*) 3.5 - 5.1 mEq/L   Chloride 99  96 - 112 mEq/L   CO2 27  19 - 32 mEq/L   Glucose, Bld 114 (*) 70 - 99 mg/dL   BUN 26 (*) 6 - 23 mg/dL   Creatinine, Ser 9.14  0.50 - 1.35 mg/dL   Calcium 9.7  8.4 - 78.2 mg/dL   GFR calc non Af Amer 64 (*) >90 mL/min   GFR calc Af Amer 74 (*) >90 mL/min   Comment: (NOTE)     The eGFR has been calculated using the CKD EPI equation.     This calculation has not been validated in all clinical situations.     eGFR's persistently <90 mL/min signify possible Chronic Kidney     Disease.  CBC     Status: Abnormal   Collection Time    09/07/12  5:17 AM      Result Value Range   WBC 7.7  4.0 - 10.5 K/uL   RBC 4.13 (*) 4.22 - 5.81 MIL/uL   Hemoglobin 14.6  13.0 - 17.0 g/dL   HCT 95.6  21.3 - 08.6 %   MCV 104.8 (*) 78.0 - 100.0 fL   MCH 35.4 (*) 26.0 - 34.0 pg   MCHC 33.7  30.0 - 36.0 g/dL   RDW 57.8  46.9 - 62.9 %   Platelets 224  150 - 400 K/uL  GLUCOSE, CAPILLARY     Status: Abnormal   Collection Time    09/07/12  8:00 AM      Result  Value Range    Glucose-Capillary 122 (*) 70 - 99 mg/dL   Comment 1 Documented in Chart     Comment 2 Notify RN      Ct Head Wo Contrast  09/06/2012   *RADIOLOGY REPORT*  Clinical Data: Left-sided weakness.  CT HEAD WITHOUT CONTRAST  Technique:  Contiguous axial images were obtained from the base of the skull through the vertex without contrast.  Comparison: None.  Findings: There are scattered low density areas in the basal ganglia and white matter tracts bilaterally.  There is a rounded low density area in the lateral right basal ganglia which is age indeterminate and could represent a subacute infarct.  Extensive low density in the subcortical and periventricular white matter suggests chronic small vessel disease.  There is no evidence for acute hemorrhage, mass lesion, midline shift or hydrocephalus. Visualized sinuses are clear.  No acute bony abnormality.  IMPRESSION: Suspicious low density in the lateral right basal ganglia. Findings may represent a subacute infarct.  No evidence for acute hemorrhage.  Scattered low density areas in the left basal ganglia may represent age indeterminate lacunar infarcts.  In addition, the patient has evidence for chronic small vessel ischemic changes.  These results were called by telephone on 09/06/2012 at 1:16 p.m. to Dr. Gwendolyn Grant, who verbally acknowledged these results.   Original Report Authenticated By: Richarda Overlie, M.D.   Billy Carson Head Wo Contrast  09/06/2012   CLINICAL DATA:  67 year old male with left-sided weakness. Inability to move left extremities. Stroke.  EXAM: MRI HEAD WITHOUT CONTRAST  MRA HEAD WITHOUT CONTRAST  TECHNIQUE: Multiplanar, multiecho pulse sequences of the brain and surrounding structures were obtained without intravenous contrast. Angiographic images of the head were obtained using MRA technique without contrast.  COMPARISON:  Head CT without contrast 09/06/2012.  FINDINGS: MRI HEAD FINDINGS  Confluent restricted diffusion in the posterior right lentiform  nuclei probably also affecting the posterior limb of the right internal and external capsules. This infarct does extend toward the right Corona radiata.  In addition, there are punctate areas of restricted diffusion in the left posterior hemisphere along the parieto-occipital sulcus (series 3, image 39) and also in the right occipital lobe.  No associated mass effect or hemorrhage with these findings. Major intracranial vascular flow voids are preserved.  Underlying numerous chronic lacunar infarcts in the bilateral deep gray matter nuclei, cerebellar hemispheres, and patchy and confluent bilateral cerebral white matter T2 and FLAIR hyperintensity. There is a chronic micro hemorrhage in the left posterior temporal lobe (series 13, image 13).  Ventricular size and configuration are within normal limits. #2 No midline shift, mass effect, or evidence of intracranial mass lesion. Negative pituitary, cervicomedullary junction visualized cervical spine. Normal bone marrow signal.  Visualized orbit soft tissues are within normal limits. Minor paranasal sinus mucosal thickening. Mastoids are clear. Negative scalp soft tissues.  MRA HEAD FINDINGS  Antegrade flow in the posterior circulation. Codominant distal vertebral arteries. Patent left PICA origin. Patent vertebrobasilar junction. No basilar artery stenosis. SCA and PCA origins are patent. Left posterior communicating artery is present, the right is diminutive or absent. There is mild focal irregularity of the right PCA P1 segment (series 106, image 26). Otherwise the bilateral PCA branches are within normal limits.  Antegrade flow in both ICA siphons. No ICA stenosis. Ophthalmic and left posterior communicating artery origins are within normal limits. Small infundibula at the right ICA terminus suspected.  Carotid termini are patent with normal MCA and ACA origins. Anterior communicating artery  and proximal ACA branches are within normal limits. Questionable moderate  irregularity and stenosis of the bilateral ACA is at the callosum marginal artery level. Bilateral MCA M1 segments are patent without stenosis. Both MCA bifurcations are patent. Visualized bilateral MCA branches are patent with mild branch irregularity.  IMPRESSION: MRI HEAD IMPRESSION  1. Acute lacunar infarct of the right lentiform nuclei, and likely affecting the posterior limbs of the right internal and external capsules. No associated mass effect or hemorrhage.  2. Underlying advanced chronic small vessel ischemia, such that punctate acute infarcts also noted in the bilateral PCA territories probably reflect synchronous small vessel disease.  MRA HEAD IMPRESSION  1. Mild irregularity of the right PCA P1 segment which could be related to the acute MRI findings on the basis of right thalamus striate artery origin involvement.  2. Otherwise mild intracranial atherosclerosis. Questionable moderate stenoses of the ACA callosomarginal arteries, versus artifact.   Electronically Signed   By: Augusto Gamble   On: 09/06/2012 18:19   Mri Brain Without Contrast  09/06/2012   CLINICAL DATA:  67 year old male with left-sided weakness. Inability to move left extremities. Stroke.  EXAM: MRI HEAD WITHOUT CONTRAST  MRA HEAD WITHOUT CONTRAST  TECHNIQUE: Multiplanar, multiecho pulse sequences of the brain and surrounding structures were obtained without intravenous contrast. Angiographic images of the head were obtained using MRA technique without contrast.  COMPARISON:  Head CT without contrast 09/06/2012.  FINDINGS: MRI HEAD FINDINGS  Confluent restricted diffusion in the posterior right lentiform nuclei probably also affecting the posterior limb of the right internal and external capsules. This infarct does extend toward the right Corona radiata.  In addition, there are punctate areas of restricted diffusion in the left posterior hemisphere along the parieto-occipital sulcus (series 3, image 39) and also in the right occipital  lobe.  No associated mass effect or hemorrhage with these findings. Major intracranial vascular flow voids are preserved.  Underlying numerous chronic lacunar infarcts in the bilateral deep gray matter nuclei, cerebellar hemispheres, and patchy and confluent bilateral cerebral white matter T2 and FLAIR hyperintensity. There is a chronic micro hemorrhage in the left posterior temporal lobe (series 13, image 13).  Ventricular size and configuration are within normal limits. #2 No midline shift, mass effect, or evidence of intracranial mass lesion. Negative pituitary, cervicomedullary junction visualized cervical spine. Normal bone marrow signal.  Visualized orbit soft tissues are within normal limits. Minor paranasal sinus mucosal thickening. Mastoids are clear. Negative scalp soft tissues.  MRA HEAD FINDINGS  Antegrade flow in the posterior circulation. Codominant distal vertebral arteries. Patent left PICA origin. Patent vertebrobasilar junction. No basilar artery stenosis. SCA and PCA origins are patent. Left posterior communicating artery is present, the right is diminutive or absent. There is mild focal irregularity of the right PCA P1 segment (series 106, image 26). Otherwise the bilateral PCA branches are within normal limits.  Antegrade flow in both ICA siphons. No ICA stenosis. Ophthalmic and left posterior communicating artery origins are within normal limits. Small infundibula at the right ICA terminus suspected.  Carotid termini are patent with normal MCA and ACA origins. Anterior communicating artery and proximal ACA branches are within normal limits. Questionable moderate irregularity and stenosis of the bilateral ACA is at the callosum marginal artery level. Bilateral MCA M1 segments are patent without stenosis. Both MCA bifurcations are patent. Visualized bilateral MCA branches are patent with mild branch irregularity.  IMPRESSION: MRI HEAD IMPRESSION  1. Acute lacunar infarct of the right lentiform  nuclei, and  likely affecting the posterior limbs of the right internal and external capsules. No associated mass effect or hemorrhage.  2. Underlying advanced chronic small vessel ischemia, such that punctate acute infarcts also noted in the bilateral PCA territories probably reflect synchronous small vessel disease.  MRA HEAD IMPRESSION  1. Mild irregularity of the right PCA P1 segment which could be related to the acute MRI findings on the basis of right thalamus striate artery origin involvement.  2. Otherwise mild intracranial atherosclerosis. Questionable moderate stenoses of the ACA callosomarginal arteries, versus artifact.   Electronically Signed   By: Augusto Gamble   On: 09/06/2012 18:19   US Carotid Duplex Bilateral  09/06/2012   *RADIOLOGY REPORT*  Clinical Data: Recent stroke  BILATERAL CAROTID DUPLEX ULTRASOUND  Technique: Wallace Cullens scale imaging, color Doppler and duplex ultrasound were performed of bilateral carotid and vertebral arteries in the neck.  Comparison:  None.  Criteria:  Quantification of carotid stenosis is based on velocity parameters that correlate the residual internal carotid diameter with NASCET-based stenosis levels, using the diameter of the distal internal carotid lumen as the denominator for stenosis measurement.  The following velocity measurements were obtained:                   PEAK SYSTOLIC/END DIASTOLIC RIGHT ICA:                        59/18cm/sec CCA:                        46/7cm/sec SYSTOLIC ICA/CCA RATIO:     1.29 DIASTOLIC ICA/CCA RATIO:    2.46 ECA:                        39cm/sec  LEFT ICA:                        76/20cm/sec CCA:                        53/9cm/sec SYSTOLIC ICA/CCA RATIO:     1.44 DIASTOLIC ICA/CCA RATIO:    2.24 ECA:                        37cm/sec  Findings:  RIGHT CAROTID ARTERY: Mild plaque formation is noted within the proximal right internal carotid artery.  The waveforms, velocities and flow velocity ratios however demonstrate no evidence of focal  hemodynamically significant stenosis.  RIGHT VERTEBRAL ARTERY:  Antegrade in nature.  LEFT CAROTID ARTERY: Mild plaque formation is noted within the proximal left internal carotid artery.  The waveforms, velocities and flow velocity ratios however demonstrate no evidence of focal hemodynamically significant stenosis.  LEFT VERTEBRAL ARTERY:  Antegrade in nature.  IMPRESSION: Mild plaque without focal significant stenosis.   Original Report Authenticated By: Alcide Clever, M.D.    MRI/MRA BRAIN  CLINICAL DATA: 67 year old male with left-sided weakness. Inability  to move left extremities. Stroke.  EXAM:  MRI HEAD WITHOUT CONTRAST  MRA HEAD WITHOUT CONTRAST  TECHNIQUE:  Multiplanar, multiecho pulse sequences of the brain and surrounding  structures were obtained without intravenous contrast. Angiographic  images of the head were obtained using MRA technique without  contrast.  COMPARISON: Head CT without contrast 09/06/2012.  FINDINGS:  MRI HEAD FINDINGS  Confluent restricted diffusion in the posterior right lentiform  nuclei probably also affecting the posterior  limb of the right  internal and external capsules. This infarct does extend toward the  right Corona radiata.  In addition, there are punctate areas of restricted diffusion in the  left posterior hemisphere along the parieto-occipital sulcus (series  3, image 39) and also in the right occipital lobe.  No associated mass effect or hemorrhage with these findings. Major  intracranial vascular flow voids are preserved.  Underlying numerous chronic lacunar infarcts in the bilateral deep  gray matter nuclei, cerebellar hemispheres, and patchy and confluent  bilateral cerebral white matter T2 and FLAIR hyperintensity. There  is a chronic micro hemorrhage in the left posterior temporal lobe  (series 13, image 13).  Ventricular size and configuration are within normal limits. #2 No  midline shift, mass effect, or evidence of intracranial  mass lesion.  Negative pituitary, cervicomedullary junction visualized cervical  spine. Normal bone marrow signal.  Visualized orbit soft tissues are within normal limits. Minor  paranasal sinus mucosal thickening. Mastoids are clear. Negative  scalp soft tissues.  MRA HEAD FINDINGS  Antegrade flow in the posterior circulation. Codominant distal  vertebral arteries. Patent left PICA origin. Patent vertebrobasilar  junction. No basilar artery stenosis. SCA and PCA origins are  patent. Left posterior communicating artery is present, the right is  diminutive or absent. There is mild focal irregularity of the right  PCA P1 segment (series 106, image 26). Otherwise the bilateral PCA  branches are within normal limits.  Antegrade flow in both ICA siphons. No ICA stenosis. Ophthalmic and  left posterior communicating artery origins are within normal  limits. Small infundibula at the right ICA terminus suspected.  Carotid termini are patent with normal MCA and ACA origins. Anterior  communicating artery and proximal ACA branches are within normal  limits. Questionable moderate irregularity and stenosis of the  bilateral ACA is at the callosum marginal artery level. Bilateral  MCA M1 segments are patent without stenosis. Both MCA bifurcations  are patent. Visualized bilateral MCA branches are patent with mild  branch irregularity.  IMPRESSION:  MRI HEAD IMPRESSION  1. Acute lacunar infarct of the right lentiform nuclei, and likely  affecting the posterior limbs of the right internal and external  capsules. No associated mass effect or hemorrhage.  2. Underlying advanced chronic small vessel ischemia, such that  punctate acute infarcts also noted in the bilateral PCA territories  probably reflect synchronous small vessel disease.  MRA HEAD IMPRESSION  1. Mild irregularity of the right PCA P1 segment which could be  related to the acute MRI findings on the basis of right thalamus  striate  artery origin involvement.  2. Otherwise mild intracranial atherosclerosis. Questionable  moderate stenoses of the ACA callosomarginal arteries, versus  artifact.  Electronically Signed  By: Augusto Gamble     ECHO *[facility]* [address] [place], [place] [address] [phone]  ------------------------------------------------------------ Transthoracic Echocardiography  Patient: Billy Carson, Billy Carson Billy #: [id number] Study Date: 09/06/2012 Gender: M Age: 53 Height: 177.8cm Weight: 115kg BSA: 2.47m^2 Pt. Status: Room: APA18  SONOGRAPHER Karrie Doffing PERFORMING Lennox Laity ORDERING Gherghe, [place] REFERRING [place], [place] ATTENDING Marena Chancy cc:  ------------------------------------------------------------ LV EF: 55% - 60%  ------------------------------------------------------------ Indications: TIA 435.9.  ------------------------------------------------------------ History: PMH: Stroke. Transient ischemic attack. PMH: ETOH abuse Risk factors: Current tobacco use. Hypertension.  ------------------------------------------------------------ Study Conclusions  - Left ventricle: The cavity size was normal. There was moderate concentric hypertrophy. Systolic function was normal. The estimated ejection fraction was in the range of 55% to 60%. Wall  motion was normal; there were no regional wall motion abnormalities. Doppler parameters are consistent with abnormal left ventricular relaxation (grade 1 diastolic dysfunction). - Ventricular septum: Septal motion showed mild dyssynergy. - Aortic valve: Mildly calcified annulus. Trileaflet; mildly thickened leaflets. Trivial regurgitation. - Atrial septum: No defect or patent foramen ovale was identified based on very limited interrogation. - Tricuspid valve: Physiologic regurgitation. - Pulmonary arteries: Systolic pressure could not be accurately estimated. - Inferior vena cava: Not  visualized. - Pericardium, extracardiac: A prominent pericardial fat pad was present. Impressions:  - No prior study for comparison. Moderate LVH with LVEF 55-60%, mild septal dyssynergy, grade 1 diastolic dysfunction. Trace aortic regurgitation. No obvious ASD or PFO based on limited interrogation.      Billy Carson A. Gerilyn Pilgrim, M.D.  Diplomate, Biomedical engineer of Psychiatry and Neurology ( Neurology). 09/07/2012, 9:43 AM

## 2012-09-07 NOTE — Progress Notes (Signed)
[facility] PROGRESS NOTE  Billy Carson WUJ:[id number] DOB: [birth year] DOA: 09/06/2012 PCP: Default, Provider, MD  HPI: Billy Carson is a 66 y.o. male without known medical history, mainly because he does not see a doctor frequently, presents to the emergency room with a chief complaint of left-sided weakness for the past 2 days. States that 2 days ago he has noticed that he is unable to move his left arm and left leg. He did not seek help at that time and only talked about his symptoms with his wife. When he saw that his symptoms are not resolving, decided to present to the emergency room. He denies any past medical history, he does not have a primary care doctor. He is not taking any medications on a chronic basis at home. He has a history of tobacco abuse, has been smoking about a pack every 3 days for his life. Has a history of alcohol use, has been drinking a pint of vodka for all his life. Never had symptoms like this in the past. He denies any headaches, lightheadedness or dizziness. He denies any chest pain. He denies any shortness or breath. He denies any nausea vomiting or diarrhea. For all these started, he was pretty active without any deficits. In the emergency room, he underwent a CT scan of the head which was suspicious for subacute infarct in the lateral right basal ganglia without evidence of acute hemorrhage  Assessment/Plan: Left-sided weakness  - MRI positive for CVA - PT rec SNF - Neuro following, appreciate input.  - lipid panel OK, HBA1C 5.7 Hypertension  - Allow permissive hypertension for couple days  Tobacco abuse  - nicotine patch  Alcohol abuse  - Last drink on Monday. Closely monitor. On CIWA, not triggering.  Elevated MCV  - low folate, supplement DVT prophylaxis  - Heparin subcutaneous   Code Status: Full Family Communication: none  Disposition Plan: SNF 1-2 days  Consultants:  Neurology  Procedures:  2D echo Impressions: - No  prior study for comparison. Moderate LVH with LVEF 55-60%, mild septal dyssynergy, grade 1 diastolic dysfunction. Trace aortic regurgitation. No obvious ASD or PFO based on limited interrogation.  Anti-infectives   None     Antibiotics Given (last 72 hours)   None      HPI/Subjective: - no complaints, no improvement in left sided weakness  Objective: Filed Vitals:   09/06/12 2128 09/07/12 0211 09/07/12 0454 09/07/12 0500  BP: 157/94 142/85 159/112 145/95  Pulse: 85 74 77   Temp: 98.7 F (37.1 C)  97.8 F (36.6 C)   TempSrc: Oral  Oral   Resp: 18 18 18    Height:      Weight:      SpO2: 97% 100% 100%    No intake or output data in the 24 hours ending 09/07/12 0920 Filed Weights   09/06/12 1231 09/06/12 1635  Weight: 114.76 kg (253 lb) 80.468 kg (177 lb 6.4 oz)    Exam:  General:  NAD  Cardiovascular: regular rate and rhythm, without MRG  Respiratory: good air movement, clear to auscultation throughout, no wheezing, ronchi or rales  Abdomen: soft, not tender to palpation, positive bowel sounds  MSK: no peripheral edema  Neuro: Significant left upper and left lower extremity weakness, 1/5. No facial droop appreciated. Minimal if any dysarthria. Right-sided strength 5 out of 5.  Data Reviewed: Basic Metabolic Panel:  Recent Labs Lab 09/06/12 1238 09/06/12 1515 09/07/12 0517  NA 139  --  140  K  3.9  --  3.4*  CL 94*  --  99  CO2 27  --  27  GLUCOSE 131*  --  114*  BUN 17  --  26*  CREATININE 1.04  --  1.14  CALCIUM 10.3  --  9.7  MG  --  2.1  --   PHOS  --  3.3  --    CBC:  Recent Labs Lab 09/06/12 1238 09/07/12 0517  WBC 8.3 7.7  NEUTROABS 6.1  --   HGB 15.7 14.6  HCT 45.6 43.3  MCV 103.9* 104.8*  PLT 232 224   Cardiac Enzymes:  Recent Labs Lab 09/06/12 1238  CKTOTAL 280*  TROPONINI <0.30   CBG:  Recent Labs Lab 09/06/12 1811 09/06/12 1956 09/07/12 0800  GLUCAP 117* 169* 122*    Studies: Ct Head Wo Contrast  09/06/2012    *RADIOLOGY REPORT*  Clinical Data: Left-sided weakness.  CT HEAD WITHOUT CONTRAST  Technique:  Contiguous axial images were obtained from the base of the skull through the vertex without contrast.  Comparison: None.  Findings: There are scattered low density areas in the basal ganglia and white matter tracts bilaterally.  There is a rounded low density area in the lateral right basal ganglia which is age indeterminate and could represent a subacute infarct.  Extensive low density in the subcortical and periventricular white matter suggests chronic small vessel disease.  There is no evidence for acute hemorrhage, mass lesion, midline shift or hydrocephalus. Visualized sinuses are clear.  No acute bony abnormality.  IMPRESSION: Suspicious low density in the lateral right basal ganglia. Findings may represent a subacute infarct.  No evidence for acute hemorrhage.  Scattered low density areas in the left basal ganglia may represent age indeterminate lacunar infarcts.  In addition, the patient has evidence for chronic small vessel ischemic changes.  These results were called by telephone on 09/06/2012 at 1:16 p.m. to Dr. Gwendolyn Grant, who verbally acknowledged these results.   Original Report Authenticated By: Richarda Overlie, M.D.   Mr Maxine Glenn Head Wo Contrast  09/06/2012   CLINICAL DATA:  67 year old male with left-sided weakness. Inability to move left extremities. Stroke.  EXAM: MRI HEAD WITHOUT CONTRAST  MRA HEAD WITHOUT CONTRAST  TECHNIQUE: Multiplanar, multiecho pulse sequences of the brain and surrounding structures were obtained without intravenous contrast. Angiographic images of the head were obtained using MRA technique without contrast.  COMPARISON:  Head CT without contrast 09/06/2012.  FINDINGS: MRI HEAD FINDINGS  Confluent restricted diffusion in the posterior right lentiform nuclei probably also affecting the posterior limb of the right internal and external capsules. This infarct does extend toward the right Corona  radiata.  In addition, there are punctate areas of restricted diffusion in the left posterior hemisphere along the parieto-occipital sulcus (series 3, image 39) and also in the right occipital lobe.  No associated mass effect or hemorrhage with these findings. Major intracranial vascular flow voids are preserved.  Underlying numerous chronic lacunar infarcts in the bilateral deep gray matter nuclei, cerebellar hemispheres, and patchy and confluent bilateral cerebral white matter T2 and FLAIR hyperintensity. There is a chronic micro hemorrhage in the left posterior temporal lobe (series 13, image 13).  Ventricular size and configuration are within normal limits. #2 No midline shift, mass effect, or evidence of intracranial mass lesion. Negative pituitary, cervicomedullary junction visualized cervical spine. Normal bone marrow signal.  Visualized orbit soft tissues are within normal limits. Minor paranasal sinus mucosal thickening. Mastoids are clear. Negative scalp soft tissues.  MRA  HEAD FINDINGS  Antegrade flow in the posterior circulation. Codominant distal vertebral arteries. Patent left PICA origin. Patent vertebrobasilar junction. No basilar artery stenosis. SCA and PCA origins are patent. Left posterior communicating artery is present, the right is diminutive or absent. There is mild focal irregularity of the right PCA P1 segment (series 106, image 26). Otherwise the bilateral PCA branches are within normal limits.  Antegrade flow in both ICA siphons. No ICA stenosis. Ophthalmic and left posterior communicating artery origins are within normal limits. Small infundibula at the right ICA terminus suspected.  Carotid termini are patent with normal MCA and ACA origins. Anterior communicating artery and proximal ACA branches are within normal limits. Questionable moderate irregularity and stenosis of the bilateral ACA is at the callosum marginal artery level. Bilateral MCA M1 segments are patent without stenosis.  Both MCA bifurcations are patent. Visualized bilateral MCA branches are patent with mild branch irregularity.  IMPRESSION: MRI HEAD IMPRESSION  1. Acute lacunar infarct of the right lentiform nuclei, and likely affecting the posterior limbs of the right internal and external capsules. No associated mass effect or hemorrhage.  2. Underlying advanced chronic small vessel ischemia, such that punctate acute infarcts also noted in the bilateral PCA territories probably reflect synchronous small vessel disease.  MRA HEAD IMPRESSION  1. Mild irregularity of the right PCA P1 segment which could be related to the acute MRI findings on the basis of right thalamus striate artery origin involvement.  2. Otherwise mild intracranial atherosclerosis. Questionable moderate stenoses of the ACA callosomarginal arteries, versus artifact.   Electronically Signed   By: Augusto Gamble   On: 09/06/2012 18:19   Mri Brain Without Contrast  09/06/2012   CLINICAL DATA:  67 year old male with left-sided weakness. Inability to move left extremities. Stroke.  EXAM: MRI HEAD WITHOUT CONTRAST  MRA HEAD WITHOUT CONTRAST  TECHNIQUE: Multiplanar, multiecho pulse sequences of the brain and surrounding structures were obtained without intravenous contrast. Angiographic images of the head were obtained using MRA technique without contrast.  COMPARISON:  Head CT without contrast 09/06/2012.  FINDINGS: MRI HEAD FINDINGS  Confluent restricted diffusion in the posterior right lentiform nuclei probably also affecting the posterior limb of the right internal and external capsules. This infarct does extend toward the right Corona radiata.  In addition, there are punctate areas of restricted diffusion in the left posterior hemisphere along the parieto-occipital sulcus (series 3, image 39) and also in the right occipital lobe.  No associated mass effect or hemorrhage with these findings. Major intracranial vascular flow voids are preserved.  Underlying numerous  chronic lacunar infarcts in the bilateral deep gray matter nuclei, cerebellar hemispheres, and patchy and confluent bilateral cerebral white matter T2 and FLAIR hyperintensity. There is a chronic micro hemorrhage in the left posterior temporal lobe (series 13, image 13).  Ventricular size and configuration are within normal limits. #2 No midline shift, mass effect, or evidence of intracranial mass lesion. Negative pituitary, cervicomedullary junction visualized cervical spine. Normal bone marrow signal.  Visualized orbit soft tissues are within normal limits. Minor paranasal sinus mucosal thickening. Mastoids are clear. Negative scalp soft tissues.  MRA HEAD FINDINGS  Antegrade flow in the posterior circulation. Codominant distal vertebral arteries. Patent left PICA origin. Patent vertebrobasilar junction. No basilar artery stenosis. SCA and PCA origins are patent. Left posterior communicating artery is present, the right is diminutive or absent. There is mild focal irregularity of the right PCA P1 segment (series 106, image 26). Otherwise the bilateral PCA branches are within  normal limits.  Antegrade flow in both ICA siphons. No ICA stenosis. Ophthalmic and left posterior communicating artery origins are within normal limits. Small infundibula at the right ICA terminus suspected.  Carotid termini are patent with normal MCA and ACA origins. Anterior communicating artery and proximal ACA branches are within normal limits. Questionable moderate irregularity and stenosis of the bilateral ACA is at the callosum marginal artery level. Bilateral MCA M1 segments are patent without stenosis. Both MCA bifurcations are patent. Visualized bilateral MCA branches are patent with mild branch irregularity.  IMPRESSION: MRI HEAD IMPRESSION  1. Acute lacunar infarct of the right lentiform nuclei, and likely affecting the posterior limbs of the right internal and external capsules. No associated mass effect or hemorrhage.  2.  Underlying advanced chronic small vessel ischemia, such that punctate acute infarcts also noted in the bilateral PCA territories probably reflect synchronous small vessel disease.  MRA HEAD IMPRESSION  1. Mild irregularity of the right PCA P1 segment which could be related to the acute MRI findings on the basis of right thalamus striate artery origin involvement.  2. Otherwise mild intracranial atherosclerosis. Questionable moderate stenoses of the ACA callosomarginal arteries, versus artifact.   Electronically Signed   By: Augusto Gamble   On: 09/06/2012 18:19   US Carotid Duplex Bilateral  09/06/2012   *RADIOLOGY REPORT*  Clinical Data: Recent stroke  BILATERAL CAROTID DUPLEX ULTRASOUND  Technique: Wallace Cullens scale imaging, color Doppler and duplex ultrasound were performed of bilateral carotid and vertebral arteries in the neck.  Comparison:  None.  Criteria:  Quantification of carotid stenosis is based on velocity parameters that correlate the residual internal carotid diameter with NASCET-based stenosis levels, using the diameter of the distal internal carotid lumen as the denominator for stenosis measurement.  The following velocity measurements were obtained:                   PEAK SYSTOLIC/END DIASTOLIC RIGHT ICA:                        59/18cm/sec CCA:                        46/7cm/sec SYSTOLIC ICA/CCA RATIO:     1.29 DIASTOLIC ICA/CCA RATIO:    2.46 ECA:                        39cm/sec  LEFT ICA:                        76/20cm/sec CCA:                        53/9cm/sec SYSTOLIC ICA/CCA RATIO:     1.44 DIASTOLIC ICA/CCA RATIO:    2.24 ECA:                        37cm/sec  Findings:  RIGHT CAROTID ARTERY: Mild plaque formation is noted within the proximal right internal carotid artery.  The waveforms, velocities and flow velocity ratios however demonstrate no evidence of focal hemodynamically significant stenosis.  RIGHT VERTEBRAL ARTERY:  Antegrade in nature.  LEFT CAROTID ARTERY: Mild plaque formation is noted  within the proximal left internal carotid artery.  The waveforms, velocities and flow velocity ratios however demonstrate no evidence of focal hemodynamically significant stenosis.  LEFT VERTEBRAL ARTERY:  Antegrade in nature.  IMPRESSION:  Mild plaque without focal significant stenosis.   Original Report Authenticated By: Alcide Clever, M.D.    Scheduled Meds: . aspirin  325 mg Oral Daily  . folic acid  1 mg Oral Daily  . heparin  5,000 Units Subcutaneous Q8H  . multivitamin with minerals  1 tablet Oral Daily  . nicotine  7 mg Transdermal Daily  . thiamine  100 mg Oral Daily   Or  . thiamine  100 mg Intravenous Daily   Continuous Infusions: . sodium chloride 50 mL/hr at 09/06/12 1901    Active Problems:   CVA (cerebral infarction)   Tobacco abuse   Alcohol abuse   Hypertension  Time spent: 8  Pamella Pert, MD [facility] Pager [phone]. If 7 PM - 7 AM, please contact night-coverage at [email or web address], password [facility] 09/07/2012, 9:20 AM  LOS: 1 day

## 2012-09-08 LAB — GLUCOSE, CAPILLARY: Glucose-Capillary: 101 mg/dL — ABNORMAL HIGH (ref 70–99)

## 2012-09-08 LAB — URINALYSIS, ROUTINE W REFLEX MICROSCOPIC
Nitrite: NEGATIVE
Protein, ur: NEGATIVE mg/dL
Specific Gravity, Urine: 1.02 (ref 1.005–1.030)
Urobilinogen, UA: 1 mg/dL (ref 0.0–1.0)

## 2012-09-08 LAB — HOMOCYSTEINE: Homocysteine: 58.9 umol/L — ABNORMAL HIGH (ref 4.0–15.4)

## 2012-09-08 LAB — BASIC METABOLIC PANEL
GFR calc Af Amer: 79 mL/min — ABNORMAL LOW (ref >90)
GFR calc non Af Amer: 68 mL/min — ABNORMAL LOW (ref >90)
Potassium: 3.3 mEq/L — ABNORMAL LOW (ref 3.5–5.1)
Sodium: 139 mEq/L (ref 135–145)

## 2012-09-08 MED ORDER — SENNOSIDES-DOCUSATE SODIUM 8.6-50 MG PO TABS
1.0000 | ORAL_TABLET | Freq: Two times a day (BID) | ORAL | Status: DC
  Administered 2012-09-08 – 2012-09-11 (×7): 1 via ORAL
  Filled 2012-09-08 (×7): qty 1

## 2012-09-08 NOTE — Progress Notes (Signed)
Physical Therapy Treatment Patient Details Name: Billy Carson MRN: [id number] DOB: 01-29-1945 Today's Date: 09/08/2012 Time: 4696-2952 PT Time Calculation (min): 26 min  PT Assessment / Plan / Recommendation  History of Present Illness     PT Comments   Pt was alert and very cooperative this morning.  It was noted that the strength in the entire left side is slightly improved with minimal movement in the left shoulder girdle and LLE.  His sitting balance is improved as is his transfer ability from supine to sit.  He attempted standing with a hemiwalker with facilitation of LLE weight bearing, but he is not yet able to fully support his weight.  He is not ready to begin gait.  Follow Up Recommendations        Does the patient have the potential to tolerate intense rehabilitation     Barriers to Discharge        Equipment Recommendations       Recommendations for Other Services    Frequency     Progress towards PT Goals Progress towards PT goals: Progressing toward goals  Plan Current plan remains appropriate    Precautions / Restrictions     Pertinent Vitals/Pain     Mobility  Bed Mobility Supine to Sit: 4: Min assist;HOB elevated Transfers Transfers: Sit to Stand;Stand to Sit Sit to Stand: 3: Mod assist;With upper extremity assist Stand to Sit: 3: Mod assist;With upper extremity assist Stand Pivot Transfers: 4: Min assist Details for Transfer Assistance: pt stood with walker for 4 minutes and was instructed in weight bearing on the LLE...he was moderately successful in keeping the knee in extension but was unable to stabilize his hip in order to fully weight bear on the LLE...he is not yet ready for gait. Ambulation/Gait Ambulation/Gait Assistance: Not tested (comment)    Exercises General Exercises - Lower Extremity Ankle Circles/Pumps: PROM;AAROM;Left;10 reps Heel Slides: AAROM;Left;10 reps Other Exercises Other Exercises: sidelying facilitation of hip flexion   Other Exercises: sidelying facilitation of knee flexion, then extension Other Exercises: sidelying facilitation of pelvic protraction   PT Diagnosis:    PT Problem List:   PT Treatment Interventions:     PT Goals (current goals can now be found in the care plan section)    Visit Information  Last PT Received On: 09/08/12    Subjective Data      Cognition       Balance  Static Sitting Balance Static Sitting - Balance Support: Right upper extremity supported Static Sitting - Level of Assistance: 4: Min assist  End of Session PT - End of Session Equipment Utilized During Treatment: Gait belt Activity Tolerance: Patient tolerated treatment well Patient left: in chair;with call bell/phone within reach;with chair alarm set Nurse Communication: Mobility status   GP     Konrad Penta 09/08/2012, 2:12 PM

## 2012-09-08 NOTE — Progress Notes (Signed)
[facility] PROGRESS NOTE  SOL ENGLERT ZOX:[id number] DOB: September 05, 1945 DOA: 09/06/2012 PCP: Default, Provider, MD  HPI: Billy Carson is a 67 y.o. male without known medical history, mainly because he does not see a doctor frequently, presents to the emergency room with a chief complaint of left-sided weakness for the past 2 days.   Assessment/Plan: Left-sided weakness  - MRI positive for CVA - PT rec SNF - Neuro following, appreciate input.  - lipid panel OK, HBA1C 5.7 - awaiting SNF placement on monday Hypertension  - normotensive this morning, will start treatment if needed.  Tobacco abuse  - nicotine patch  Alcohol abuse  - Last drink on Monday. Closely monitor. On CIWA, not triggering.  Elevated MCV  - low folate, supplement DVT prophylaxis  - Heparin subcutaneous   Code Status: Full Family Communication: none  Disposition Plan: SNF Monday  Consultants:  Neurology  Procedures:  2D echo Impressions: - No prior study for comparison. Moderate LVH with LVEF 55-60%, mild septal dyssynergy, grade 1 diastolic dysfunction. Trace aortic regurgitation. No obvious ASD or PFO based on limited interrogation.  Anti-infectives   None     Antibiotics Given (last 72 hours)   None      HPI/Subjective: - no complaints, no improvement in left sided weakness  Objective: Filed Vitals:   09/07/12 1400 09/07/12 2107 09/08/12 0259 09/08/12 0648  BP: 156/94 160/91 147/89 139/79  Pulse: 86 69 69 71  Temp: 98.7 F (37.1 C) 98.5 F (36.9 C) 98.3 F (36.8 C) 98.2 F (36.8 C)  TempSrc: Oral Oral Oral Oral  Resp: 18 18 18 20   Height:      Weight:      SpO2: 95% 98% 100% 100%    Intake/Output Summary (Last 24 hours) at 09/08/12 1154 Last data filed at 09/08/12 0700  Gross per 24 hour  Intake 2519.17 ml  Output    500 ml  Net 2019.17 ml   Filed Weights   09/06/12 1231 09/06/12 1635  Weight: 114.76 kg (253 lb) 80.468 kg (177 lb 6.4 oz)     Exam:  General:  NAD  Cardiovascular: regular rate and rhythm, without MRG  Respiratory: good air movement, clear to auscultation throughout, no wheezing, ronchi or rales  Abdomen: soft, not tender to palpation, positive bowel sounds  MSK: no peripheral edema  Neuro: Significant left upper and left lower extremity weakness, 1/5. No facial droop appreciated. Minimal if any dysarthria. Right-sided strength 5 out of 5.  Data Reviewed: Basic Metabolic Panel:  Recent Labs Lab 09/06/12 1238 09/06/12 1515 09/07/12 0517 09/08/12 0622  NA 139  --  140 139  K 3.9  --  3.4* 3.3*  CL 94*  --  99 101  CO2 27  --  27 28  GLUCOSE 131*  --  114* 114*  BUN 17  --  26* 20  CREATININE 1.04  --  1.14 1.08  CALCIUM 10.3  --  9.7 9.2  MG  --  2.1  --   --   PHOS  --  3.3  --   --    CBC:  Recent Labs Lab 09/06/12 1238 09/07/12 0517  WBC 8.3 7.7  NEUTROABS 6.1  --   HGB 15.7 14.6  HCT 45.6 43.3  MCV 103.9* 104.8*  PLT 232 224   Cardiac Enzymes:  Recent Labs Lab 09/06/12 1238  CKTOTAL 280*  TROPONINI <0.30   CBG:  Recent Labs Lab 09/07/12 1131 09/07/12 1623 09/07/12 2110 09/08/12 0726  09/08/12 1121  GLUCAP 159* 120* 110* 116* 101*    Studies: Ct Head Wo Contrast  09/06/2012   *RADIOLOGY REPORT*  Clinical Data: Left-sided weakness.  CT HEAD WITHOUT CONTRAST  Technique:  Contiguous axial images were obtained from the base of the skull through the vertex without contrast.  Comparison: None.  Findings: There are scattered low density areas in the basal ganglia and white matter tracts bilaterally.  There is a rounded low density area in the lateral right basal ganglia which is age indeterminate and could represent a subacute infarct.  Extensive low density in the subcortical and periventricular white matter suggests chronic small vessel disease.  There is no evidence for acute hemorrhage, mass lesion, midline shift or hydrocephalus. Visualized sinuses are clear.  No  acute bony abnormality.  IMPRESSION: Suspicious low density in the lateral right basal ganglia. Findings may represent a subacute infarct.  No evidence for acute hemorrhage.  Scattered low density areas in the left basal ganglia may represent age indeterminate lacunar infarcts.  In addition, the patient has evidence for chronic small vessel ischemic changes.  These results were called by telephone on 09/06/2012 at 1:16 p.m. to Dr. Gwendolyn Grant, who verbally acknowledged these results.   Original Report Authenticated By: Richarda Overlie, M.D.   Mr Maxine Glenn Head Wo Contrast  09/06/2012   CLINICAL DATA:  67 year old male with left-sided weakness. Inability to move left extremities. Stroke.  EXAM: MRI HEAD WITHOUT CONTRAST  MRA HEAD WITHOUT CONTRAST  TECHNIQUE: Multiplanar, multiecho pulse sequences of the brain and surrounding structures were obtained without intravenous contrast. Angiographic images of the head were obtained using MRA technique without contrast.  COMPARISON:  Head CT without contrast 09/06/2012.  FINDINGS: MRI HEAD FINDINGS  Confluent restricted diffusion in the posterior right lentiform nuclei probably also affecting the posterior limb of the right internal and external capsules. This infarct does extend toward the right Corona radiata.  In addition, there are punctate areas of restricted diffusion in the left posterior hemisphere along the parieto-occipital sulcus (series 3, image 39) and also in the right occipital lobe.  No associated mass effect or hemorrhage with these findings. Major intracranial vascular flow voids are preserved.  Underlying numerous chronic lacunar infarcts in the bilateral deep gray matter nuclei, cerebellar hemispheres, and patchy and confluent bilateral cerebral white matter T2 and FLAIR hyperintensity. There is a chronic micro hemorrhage in the left posterior temporal lobe (series 13, image 13).  Ventricular size and configuration are within normal limits. #2 No midline shift, mass  effect, or evidence of intracranial mass lesion. Negative pituitary, cervicomedullary junction visualized cervical spine. Normal bone marrow signal.  Visualized orbit soft tissues are within normal limits. Minor paranasal sinus mucosal thickening. Mastoids are clear. Negative scalp soft tissues.  MRA HEAD FINDINGS  Antegrade flow in the posterior circulation. Codominant distal vertebral arteries. Patent left PICA origin. Patent vertebrobasilar junction. No basilar artery stenosis. SCA and PCA origins are patent. Left posterior communicating artery is present, the right is diminutive or absent. There is mild focal irregularity of the right PCA P1 segment (series 106, image 26). Otherwise the bilateral PCA branches are within normal limits.  Antegrade flow in both ICA siphons. No ICA stenosis. Ophthalmic and left posterior communicating artery origins are within normal limits. Small infundibula at the right ICA terminus suspected.  Carotid termini are patent with normal MCA and ACA origins. Anterior communicating artery and proximal ACA branches are within normal limits. Questionable moderate irregularity and stenosis of the bilateral ACA is  at the callosum marginal artery level. Bilateral MCA M1 segments are patent without stenosis. Both MCA bifurcations are patent. Visualized bilateral MCA branches are patent with mild branch irregularity.  IMPRESSION: MRI HEAD IMPRESSION  1. Acute lacunar infarct of the right lentiform nuclei, and likely affecting the posterior limbs of the right internal and external capsules. No associated mass effect or hemorrhage.  2. Underlying advanced chronic small vessel ischemia, such that punctate acute infarcts also noted in the bilateral PCA territories probably reflect synchronous small vessel disease.  MRA HEAD IMPRESSION  1. Mild irregularity of the right PCA P1 segment which could be related to the acute MRI findings on the basis of right thalamus striate artery origin involvement.   2. Otherwise mild intracranial atherosclerosis. Questionable moderate stenoses of the ACA callosomarginal arteries, versus artifact.   Electronically Signed   By: Augusto Gamble   On: 09/06/2012 18:19   Mri Brain Without Contrast  09/06/2012   CLINICAL DATA:  67 year old male with left-sided weakness. Inability to move left extremities. Stroke.  EXAM: MRI HEAD WITHOUT CONTRAST  MRA HEAD WITHOUT CONTRAST  TECHNIQUE: Multiplanar, multiecho pulse sequences of the brain and surrounding structures were obtained without intravenous contrast. Angiographic images of the head were obtained using MRA technique without contrast.  COMPARISON:  Head CT without contrast 09/06/2012.  FINDINGS: MRI HEAD FINDINGS  Confluent restricted diffusion in the posterior right lentiform nuclei probably also affecting the posterior limb of the right internal and external capsules. This infarct does extend toward the right Corona radiata.  In addition, there are punctate areas of restricted diffusion in the left posterior hemisphere along the parieto-occipital sulcus (series 3, image 39) and also in the right occipital lobe.  No associated mass effect or hemorrhage with these findings. Major intracranial vascular flow voids are preserved.  Underlying numerous chronic lacunar infarcts in the bilateral deep gray matter nuclei, cerebellar hemispheres, and patchy and confluent bilateral cerebral white matter T2 and FLAIR hyperintensity. There is a chronic micro hemorrhage in the left posterior temporal lobe (series 13, image 13).  Ventricular size and configuration are within normal limits. #2 No midline shift, mass effect, or evidence of intracranial mass lesion. Negative pituitary, cervicomedullary junction visualized cervical spine. Normal bone marrow signal.  Visualized orbit soft tissues are within normal limits. Minor paranasal sinus mucosal thickening. Mastoids are clear. Negative scalp soft tissues.  MRA HEAD FINDINGS  Antegrade flow in the  posterior circulation. Codominant distal vertebral arteries. Patent left PICA origin. Patent vertebrobasilar junction. No basilar artery stenosis. SCA and PCA origins are patent. Left posterior communicating artery is present, the right is diminutive or absent. There is mild focal irregularity of the right PCA P1 segment (series 106, image 26). Otherwise the bilateral PCA branches are within normal limits.  Antegrade flow in both ICA siphons. No ICA stenosis. Ophthalmic and left posterior communicating artery origins are within normal limits. Small infundibula at the right ICA terminus suspected.  Carotid termini are patent with normal MCA and ACA origins. Anterior communicating artery and proximal ACA branches are within normal limits. Questionable moderate irregularity and stenosis of the bilateral ACA is at the callosum marginal artery level. Bilateral MCA M1 segments are patent without stenosis. Both MCA bifurcations are patent. Visualized bilateral MCA branches are patent with mild branch irregularity.  IMPRESSION: MRI HEAD IMPRESSION  1. Acute lacunar infarct of the right lentiform nuclei, and likely affecting the posterior limbs of the right internal and external capsules. No associated mass effect or hemorrhage.  2. Underlying advanced chronic small vessel ischemia, such that punctate acute infarcts also noted in the bilateral PCA territories probably reflect synchronous small vessel disease.  MRA HEAD IMPRESSION  1. Mild irregularity of the right PCA P1 segment which could be related to the acute MRI findings on the basis of right thalamus striate artery origin involvement.  2. Otherwise mild intracranial atherosclerosis. Questionable moderate stenoses of the ACA callosomarginal arteries, versus artifact.   Electronically Signed   By: Augusto Gamble   On: 09/06/2012 18:19   US Carotid Duplex Bilateral  09/06/2012   *RADIOLOGY REPORT*  Clinical Data: Recent stroke  BILATERAL CAROTID DUPLEX ULTRASOUND   Technique: Wallace Cullens scale imaging, color Doppler and duplex ultrasound were performed of bilateral carotid and vertebral arteries in the neck.  Comparison:  None.  Criteria:  Quantification of carotid stenosis is based on velocity parameters that correlate the residual internal carotid diameter with NASCET-based stenosis levels, using the diameter of the distal internal carotid lumen as the denominator for stenosis measurement.  The following velocity measurements were obtained:                   PEAK SYSTOLIC/END DIASTOLIC RIGHT ICA:                        59/18cm/sec CCA:                        46/7cm/sec SYSTOLIC ICA/CCA RATIO:     1.29 DIASTOLIC ICA/CCA RATIO:    2.46 ECA:                        39cm/sec  LEFT ICA:                        76/20cm/sec CCA:                        53/9cm/sec SYSTOLIC ICA/CCA RATIO:     1.44 DIASTOLIC ICA/CCA RATIO:    2.24 ECA:                        37cm/sec  Findings:  RIGHT CAROTID ARTERY: Mild plaque formation is noted within the proximal right internal carotid artery.  The waveforms, velocities and flow velocity ratios however demonstrate no evidence of focal hemodynamically significant stenosis.  RIGHT VERTEBRAL ARTERY:  Antegrade in nature.  LEFT CAROTID ARTERY: Mild plaque formation is noted within the proximal left internal carotid artery.  The waveforms, velocities and flow velocity ratios however demonstrate no evidence of focal hemodynamically significant stenosis.  LEFT VERTEBRAL ARTERY:  Antegrade in nature.  IMPRESSION: Mild plaque without focal significant stenosis.   Original Report Authenticated By: Alcide Clever, M.D.    Scheduled Meds: . aspirin  325 mg Oral Daily  . folic acid  1 mg Oral Daily  . heparin  5,000 Units Subcutaneous Q8H  . multivitamin with minerals  1 tablet Oral Daily  . nicotine  7 mg Transdermal Daily  . thiamine  100 mg Oral Daily   Or  . thiamine  100 mg Intravenous Daily   Continuous Infusions: . sodium chloride 50 mL/hr at 09/08/12  1610    Active Problems:   CVA (cerebral infarction)   Tobacco abuse   Alcohol abuse   Hypertension  Time spent: 15  Pamella Pert, MD [facility] Pager [phone]. If  7 PM - 7 AM, please contact night-coverage at [email or web address], password [facility] 09/08/2012, 11:54 AM  LOS: 2 days

## 2012-09-09 ENCOUNTER — Inpatient Hospital Stay (HOSPITAL_COMMUNITY): Payer: Medicare Other

## 2012-09-09 DIAGNOSIS — H538 Other visual disturbances: Secondary | ICD-10-CM

## 2012-09-09 LAB — BASIC METABOLIC PANEL
BUN: 12 mg/dL (ref 6–23)
Calcium: 9.3 mg/dL (ref 8.4–10.5)
Creatinine, Ser: 0.94 mg/dL (ref 0.50–1.35)
GFR calc Af Amer: >90 mL/min (ref >90)
GFR calc non Af Amer: 83 mL/min — ABNORMAL LOW (ref >90)
Potassium: 3.3 mEq/L — ABNORMAL LOW (ref 3.5–5.1)

## 2012-09-09 LAB — GLUCOSE, CAPILLARY
Glucose-Capillary: 91 mg/dL (ref 70–99)
Glucose-Capillary: 110 mg/dL — ABNORMAL HIGH (ref 70–99)
Glucose-Capillary: 93 mg/dL (ref 70–99)

## 2012-09-09 MED ORDER — LISINOPRIL 5 MG PO TABS
5.0000 mg | ORAL_TABLET | Freq: Every day | ORAL | Status: DC
  Administered 2012-09-09: 5 mg via ORAL
  Filled 2012-09-09: qty 1

## 2012-09-09 MED ORDER — LABETALOL HCL 5 MG/ML IV SOLN: 5.0000 mg | Freq: Once | INTRAVENOUS | Status: DC

## 2012-09-09 MED ORDER — HYDRALAZINE HCL 20 MG/ML IJ SOLN
5.0000 mg | Freq: Four times a day (QID) | INTRAMUSCULAR | Status: DC | PRN
  Administered 2012-09-09 – 2012-09-11 (×3): 5 mg via INTRAVENOUS
  Filled 2012-09-09 (×3): qty 1

## 2012-09-09 MED ORDER — METOPROLOL TARTRATE 25 MG PO TABS
12.5000 mg | ORAL_TABLET | Freq: Two times a day (BID) | ORAL | Status: DC
  Administered 2012-09-09 – 2012-09-11 (×4): 12.5 mg via ORAL
  Filled 2012-09-09 (×4): qty 1

## 2012-09-09 MED ORDER — POTASSIUM CHLORIDE CRYS ER 20 MEQ PO TBCR
30.0000 meq | EXTENDED_RELEASE_TABLET | Freq: Two times a day (BID) | ORAL | Status: AC
  Administered 2012-09-09 (×2): 30 meq via ORAL
  Filled 2012-09-09 (×2): qty 1

## 2012-09-09 NOTE — Progress Notes (Signed)
Notified Dr. Elvera Lennox that pts BP is 196/107, order received to give 2000 dose of metoprolol now, and new order for IV hydralazine received. Sheryn Bison

## 2012-09-09 NOTE — Progress Notes (Signed)
[facility] PROGRESS NOTE  Billy Carson EAV:[id number] DOB: 12-23-1945 DOA: 09/06/2012 PCP: Default, Provider, MD  HPI: Billy Carson is a 67 y.o. male without known medical history, mainly because he does not see a doctor frequently, presents to the emergency room with a chief complaint of left-sided weakness for the past 2 days.   Assessment/Plan: Left-sided weakness  - MRI positive for CVA; waiting for SNF - Neuro following, appreciate input.  - lipid panel OK, HBA1C 5.7 - transient visual changes yesterday, patient just reported them today. Will repeat CT Hypertension  - start antihypertensives today.  Tobacco abuse  - nicotine patch  Alcohol abuse  - Last drink on Monday. Not triggering CIWA Elevated MCV  - low folate, supplement DVT prophylaxis  - Heparin subcutaneous   Code Status: Full Family Communication: none  Disposition Plan: SNF Monday  Consultants:  Neurology  Procedures:  2D echo Impressions: - No prior study for comparison. Moderate LVH with LVEF 55-60%, mild septal dyssynergy, grade 1 diastolic dysfunction. Trace aortic regurgitation. No obvious ASD or PFO based on limited interrogation.  Anti-infectives   None     Antibiotics Given (last 72 hours)   None      HPI/Subjective: - left visual filed changes yesterday, endorsing transient blurry vision  Objective: Filed Vitals:   09/08/12 1418 09/08/12 2031 09/09/12 0217 09/09/12 0600  BP: 145/81 180/90 180/88 179/99  Pulse: 79 86 71 69  Temp: 98.1 F (36.7 C) 98.1 F (36.7 C) 98.1 F (36.7 C) 98.2 F (36.8 C)  TempSrc:  Oral Oral Oral  Resp: 20 24 22 22   Height:      Weight:      SpO2: 100% 99% 100% 100%    Intake/Output Summary (Last 24 hours) at 09/09/12 1256 Last data filed at 09/09/12 0601  Gross per 24 hour  Intake    480 ml  Output    475 ml  Net      5 ml   Filed Weights   09/06/12 1231 09/06/12 1635  Weight: 114.76 kg (253 lb) 80.468 kg (177 lb 6.4  oz)    Exam:  General:  NAD  Cardiovascular: regular rate and rhythm, without MRG  Respiratory: good air movement, clear to auscultation throughout, no wheezing, ronchi or rales  Abdomen: soft, not tender to palpation, positive bowel sounds  MSK: no peripheral edema  Neuro: Significant left upper and left lower extremity weakness, 1/5. No facial droop appreciated. Minimal if any dysarthria. Right-sided strength 5 out of 5.  Data Reviewed: Basic Metabolic Panel:  Recent Labs Lab 09/06/12 1238 09/06/12 1515 09/07/12 0517 09/08/12 0622 09/09/12 0621  NA 139  --  140 139 136  K 3.9  --  3.4* 3.3* 3.3*  CL 94*  --  99 101 101  CO2 27  --  27 28 27   GLUCOSE 131*  --  114* 114* 111*  BUN 17  --  26* 20 12  CREATININE 1.04  --  1.14 1.08 0.94  CALCIUM 10.3  --  9.7 9.2 9.3  MG  --  2.1  --   --   --   PHOS  --  3.3  --   --   --    CBC:  Recent Labs Lab 09/06/12 1238 09/07/12 0517  WBC 8.3 7.7  NEUTROABS 6.1  --   HGB 15.7 14.6  HCT 45.6 43.3  MCV 103.9* 104.8*  PLT 232 224   Cardiac Enzymes:  Recent Labs Lab 09/06/12  1238  CKTOTAL 280*  TROPONINI <0.30   CBG:  Recent Labs Lab 09/08/12 1121 09/08/12 1653 09/08/12 2101 09/09/12 0731 09/09/12 1117  GLUCAP 101* 114* 101* 91 110*    Studies: No results found.  Scheduled Meds: . aspirin  325 mg Oral Daily  . folic acid  1 mg Oral Daily  . heparin  5,000 Units Subcutaneous Q8H  . multivitamin with minerals  1 tablet Oral Daily  . nicotine  7 mg Transdermal Daily  . senna-docusate  1 tablet Oral BID  . thiamine  100 mg Oral Daily   Or  . thiamine  100 mg Intravenous Daily   Continuous Infusions: . sodium chloride 50 mL/hr at 09/09/12 4782    Active Problems:   CVA (cerebral infarction)   Tobacco abuse   Alcohol abuse   Hypertension  Time spent: 95  Pamella Pert, MD [facility] Pager [phone]. If 7 PM - 7 AM, please contact night-coverage at [email or web address], password  [facility] 09/09/2012, 12:56 PM  LOS: 3 days

## 2012-09-09 NOTE — Progress Notes (Signed)
During neuro assessment, pt complained that vision seemed "tilted." he states that this began yesterday. Dr. Elvera Lennox made aware. He is going to assess pt. CT of head w/o contrast ordered for today. Sheryn Bison

## 2012-09-09 NOTE — Progress Notes (Signed)
Notified Dr. Elvera Lennox that pt has had irregular rhythm on telemetry at times. This has been going on at least since yesterday. The rhythm is not afib, it simply appears that pt gets slightly irregular and corrects himself. No new orders at this time. Dr. Elvera Lennox plans to come look at telemetry. Sheryn Bison

## 2012-09-10 LAB — GLUCOSE, CAPILLARY
Glucose-Capillary: 97 mg/dL (ref 70–99)
Glucose-Capillary: 103 mg/dL — ABNORMAL HIGH (ref 70–99)

## 2012-09-10 MED ORDER — LISINOPRIL 10 MG PO TABS
20.0000 mg | ORAL_TABLET | Freq: Every day | ORAL | Status: DC
  Administered 2012-09-10 – 2012-09-11 (×2): 20 mg via ORAL
  Filled 2012-09-10 (×2): qty 2

## 2012-09-10 MED ORDER — HYDRALAZINE HCL 10 MG PO TABS
10.0000 mg | ORAL_TABLET | Freq: Three times a day (TID) | ORAL | Status: DC
  Administered 2012-09-10 – 2012-09-11 (×4): 10 mg via ORAL
  Filled 2012-09-10 (×4): qty 1

## 2012-09-10 NOTE — Care Management Note (Signed)
    Page 1 of 1   09/11/2012     4:49:27 PM   CARE MANAGEMENT NOTE 09/11/2012  Patient:  Billy Carson, Billy Carson   Account Number:  [id number]  Date Initiated:  09/10/2012  Documentation initiated by:  Anibal Henderson  Subjective/Objective Assessment:   Admitted from home with spouse.Dx CVA. PT recommending CIR, or SNF, and [facility] has accepted. CIR is reviewing chart. Pt is agreeable with either, as long as he can get "better"     Action/Plan:   referred to CSW and CIR   Anticipated DC Date:  09/11/2012   Anticipated DC Plan:  SKILLED NURSING FACILITY  In-house referral  Clinical Social Worker      DC Planning Services  CM consult      Choice offered to / List presented to:  C-1 Patient           Status of service:  Completed, signed off Medicare Important Message given?   (If response is "NO", the following Medicare IM given date fields will be blank) Date Medicare IM given:   Date Additional Medicare IM given:    Discharge Disposition:    Per UR Regulation:  Reviewed for med. necessity/level of care/duration of stay  If discussed at Long Length of Stay Meetings, dates discussed:    Comments:  09/11/12 1640 Anibal Henderson RN/CM Pt D/C to [facility] 09/11/12 0900 Anibal Henderson RN Per PT , pt did not have a stable D/C plan, due to wife is an invalid. Notified CIR, and pt is not appropriate for CIR. Pt has been accepted by [facility], and he has accepted the bed there. MD is monitoring B/P and will decide on D/C later today 09/10/12 1400 Anibal Henderson RN/CM

## 2012-09-10 NOTE — Progress Notes (Signed)
[facility] PROGRESS NOTE  Billy Carson ZOX:[id number] DOB: [birth year] DOA: 09/06/2012 PCP: Default, Provider, MD  HPI: Billy Carson is a 67 y.o. male without known medical history, mainly because he does not see a doctor frequently, presents to the emergency room with a chief complaint of left-sided weakness for the past 2 days.   Assessment/Plan: Left-sided weakness - due to new diagnosed strokes, with MRI positive. He has persistent left arm and left leg weakness. He will likely need skilled nursing facility versus inpatient rehabilitation on discharge. Hypertension - patient persistently hypertensive today, so not being able to be discharge. I have increased his lisinopril to 20 mg daily and added hydralazine as well. Continue to monitor, maybe up to be discharged tomorrow if his blood pressure is better controlled. Tobacco abuse - nicotine patch  Alcohol abuse - Last drink on Monday. Not triggering CIWA.  Elevated MCV - low folate, supplement DVT prophylaxis - Heparin subcutaneous  Code Status: Full Family Communication: none  Disposition Plan: SNF hopefully 1-2 days  Consultants:  Neurology  Procedures:  2D echo Impressions: - No prior study for comparison. Moderate LVH with LVEF 55-60%, mild septal dyssynergy, grade 1 diastolic dysfunction. Trace aortic regurgitation. No obvious ASD or PFO based on limited interrogation.  Anti-infectives   None     Antibiotics Given (last 72 hours)   None      HPI/Subjective: - Feeling better, subjectively appreciates that his strength is better on the left  Objective: Filed Vitals:   09/10/12 0612 09/10/12 0624 09/10/12 1300 09/10/12 1345  BP: 189/87 181/92 177/91 175/107  Pulse: 63  78 71  Temp: 98.1 F (36.7 C)   98.9 F (37.2 C)  TempSrc: Oral   Oral  Resp: 20   20  Height:      Weight:      SpO2: 100%   98%    Intake/Output Summary (Last 24 hours) at 09/10/12 1757 Last data filed at 09/10/12  1403  Gross per 24 hour  Intake 1053.33 ml  Output    525 ml  Net 528.33 ml   Filed Weights   09/06/12 1231 09/06/12 1635  Weight: 114.76 kg (253 lb) 80.468 kg (177 lb 6.4 oz)    Exam:  General:  NAD  Cardiovascular: regular rate and rhythm, without MRG  Respiratory: good air movement, clear to auscultation throughout, no wheezing, ronchi or rales  Abdomen: soft, not tender to palpation, positive bowel sounds  MSK: no peripheral edema  Neuro: Significant left upper and left lower extremity weakness, 1/5. No facial droop appreciated. Minimal if any dysarthria. Right-sided strength 5 out of 5.  Data Reviewed: Basic Metabolic Panel:  Recent Labs Lab 09/06/12 1238 09/06/12 1515 09/07/12 0517 09/08/12 0622 09/09/12 0621 09/09/12 1413  NA 139  --  140 139 136  --   K 3.9  --  3.4* 3.3* 3.3* 3.3*  CL 94*  --  99 101 101  --   CO2 27  --  27 28 27   --   GLUCOSE 131*  --  114* 114* 111*  --   BUN 17  --  26* 20 12  --   CREATININE 1.04  --  1.14 1.08 0.94  --   CALCIUM 10.3  --  9.7 9.2 9.3  --   MG  --  2.1  --   --   --   --   PHOS  --  3.3  --   --   --   --  CBC:  Recent Labs Lab 09/06/12 1238 09/07/12 0517  WBC 8.3 7.7  NEUTROABS 6.1  --   HGB 15.7 14.6  HCT 45.6 43.3  MCV 103.9* 104.8*  PLT 232 224   Cardiac Enzymes:  Recent Labs Lab 09/06/12 1238  CKTOTAL 280*  TROPONINI <0.30   CBG:  Recent Labs Lab 09/09/12 1117 09/09/12 1631 09/10/12 0707 09/10/12 1206 09/10/12 1638  GLUCAP 110* 93 97 103* 93    Studies: Ct Head Wo Contrast  09/09/2012   CLINICAL DATA:  67 year old male with new each visual changes. Recent acute lacunar infarcts.  EXAM: CT HEAD WITHOUT CONTRAST  TECHNIQUE: Contiguous axial images were obtained from the base of the skull through the vertex without intravenous contrast.  COMPARISON:  Brain MRI without contrast 09/06/2012. Head CT without contrast 09/06/2012.  FINDINGS: Visualized paranasal sinuses and mastoids are  clear. No acute osseous abnormality identified. Stable and negative orbit and scalp soft tissues. Calcified atherosclerosis at the skull base.  Expected evolution of hypodensity associated with the right lentiform nuclei lacunar infarct. No associated mass effect or hemorrhage. Superimposed other deep gray matter nuclei hypodensity is also stable and was chronic by MRI.  Stable bilateral PCA territory gray-white matter differentiation. Stable posterior fossa gray-white matter differentiation. Cerebral white matter hypodensity not significantly changed. Ventricular size and configuration are within normal limits. No midline shift, mass effect, or evidence of intracranial mass lesion. No suspicious intracranial vascular hyperdensity. No acute intracranial hemorrhage identified.  IMPRESSION: 1. Expected evolution of right lentiform nucleus lacunar infarct with no mass effect or hemorrhage.  2. Otherwise stable non contrast CT appearance of the brain; the punctate PCA territory lacunar infarcts suggested on the recent MRI remain occult on CT.   Electronically Signed   By: Augusto Gamble   On: 09/09/2012 14:49    Scheduled Meds: . aspirin  325 mg Oral Daily  . folic acid  1 mg Oral Daily  . heparin  5,000 Units Subcutaneous Q8H  . hydrALAZINE  10 mg Oral Q8H  . lisinopril  20 mg Oral Daily  . metoprolol tartrate  12.5 mg Oral BID  . multivitamin with minerals  1 tablet Oral Daily  . nicotine  7 mg Transdermal Daily  . senna-docusate  1 tablet Oral BID  . thiamine  100 mg Oral Daily   Or  . thiamine  100 mg Intravenous Daily   Continuous Infusions: . sodium chloride 50 mL/hr at 09/10/12 0251    Active Problems:   CVA (cerebral infarction)   Tobacco abuse   Alcohol abuse   Hypertension  Time spent: 4  Pamella Pert, MD [facility] Pager [phone]. If 7 PM - 7 AM, please contact night-coverage at [email or web address], password [facility] 09/10/2012, 5:57 PM  LOS: 4 days

## 2012-09-10 NOTE — Progress Notes (Signed)
Rehab Admissions Coordinator Note:  Patient was screened by Trish Mage for appropriateness for an Inpatient Acute Rehab Consult. Noted PT recommending CIR.   At this time, we are recommending Inpatient Rehab consult.  I have been contacted by the case manager and will follow up on this patient for possible admission to inpatient rehab here at [facility].  Call me for questions.    Trish Mage 09/10/2012, 12:51 PM  I can be reached at [phone].

## 2012-09-10 NOTE — Clinical Social Work Note (Signed)
Pt's blood pressure elevated and not stable for transfer to SNF. CSW left voicemail updating [facility]. Pt worked with PT and recommendation was for CIR/SNF. CM made referral to CIR with pt's permission for consideration.   Derenda Fennel, [place] [phone]

## 2012-09-10 NOTE — Progress Notes (Signed)
Pt's BP 181/92. 5mg  of Hydralazine given per order.

## 2012-09-10 NOTE — Progress Notes (Signed)
Physical Therapy Treatment Patient Details Name: Billy Carson MRN: [id number] DOB: 12/24/1945 Today's Date: 09/10/2012 Time: 1100-1145 PT Time Calculation (min): 45 min  PT Assessment / Plan / Recommendation  History of Present Illness  Pt is a 67 yo male with CVA causing Lt sided weakness   PT Comments   Pt improving in LE strength with hip mm 2/5; knee 2+/5; gastroc 2/5; AT 0/5.  Pt will benefit from an AFO to improve safety of walking.  Follow Up Recommendations  CIR;SNF     Does the patient have the potential to tolerate intense rehabilitation   yes  Barriers to Discharge  none      Equipment Recommendations   at next venue    Recommendations for Other Services OT consult  Frequency   daily  Progress towards PT Goals Progress towards PT goals: Progressing toward goals  Plan Other (comment) (recommend IP referral)    Precautions / Restrictions Precautions Precautions: Fall Restrictions Weight Bearing Restrictions: No Other Position/Activity Restrictions: Pt allowing more weight in Lt LE   Pertinent Vitals/Pain 0/10    Mobility  Bed Mobility Supine to Sit: 4: Min assist;HOB flat Transfers Transfers: Sit to Stand Sit to Stand: 3: Mod assist;With upper extremity assist Stand to Sit: 4: Min assist;With upper extremity assist Ambulation/Gait Ambulation/Gait Assistance: 3: Mod assist Ambulation Distance (Feet): 6 Feet Assistive device: Hemi-walker Gait Pattern: Step-to pattern Gait velocity: slow    Exercises General Exercises - Lower Extremity Ankle Circles/Pumps: PROM;AAROM;Left;10 reps;Supine Long Arc Quad: Left;10 reps Heel Slides: AAROM;Left;10 reps;Supine Hip ABduction/ADduction: AAROM;Left;10 reps;Supine Hip Flexion/Marching: Both;10 reps;Seated Toe Raises: PROM;Left;10 reps Heel Raises: Both;Seated;AAROM;10 reps Mini-Sqauts: Strengthening;Both;5 reps   PT Diagnosis:   difficulty walking; Lt sided weakness PT Problem List:  difficulty walking,  decreased activity tolerance, decreased balance PT Treatment Interventions:   gt, ex  PT Goals (current goals can now be found in the care plan section)    Visit Information  Last PT Received On: 09/10/12    Subjective Data   Doing well; realizes it's going to take some time   Cognition   alert oriented    Balance   decreased  End of Session PT - End of Session Equipment Utilized During Treatment: Gait belt Activity Tolerance: Patient tolerated treatment well Patient left: in chair;with call bell/phone within reach;with chair alarm set Nurse Communication: Mobility status   GP     Julea Hutto,CINDY 09/10/2012, 11:52 AM

## 2012-09-11 LAB — BASIC METABOLIC PANEL
CO2: 25 mEq/L (ref 19–32)
Calcium: 9.5 mg/dL (ref 8.4–10.5)
Creatinine, Ser: 1.04 mg/dL (ref 0.50–1.35)
Glucose, Bld: 102 mg/dL — ABNORMAL HIGH (ref 70–99)

## 2012-09-11 MED ORDER — LISINOPRIL 10 MG PO TABS: 40.0000 mg | ORAL_TABLET | Freq: Every day | ORAL | Status: DC

## 2012-09-11 MED ORDER — AMLODIPINE BESYLATE 5 MG PO TABS
5.0000 mg | ORAL_TABLET | Freq: Every day | ORAL | Status: DC
  Administered 2012-09-11: 5 mg via ORAL
  Filled 2012-09-11: qty 1

## 2012-09-11 MED ORDER — LISINOPRIL 40 MG PO TABS: 20.0000 mg | ORAL_TABLET | Freq: Every day | ORAL | Status: DC

## 2012-09-11 MED ORDER — NICOTINE 7 MG/24HR TD PT24: 1.0000 | MEDICATED_PATCH | Freq: Every day | TRANSDERMAL | Status: DC

## 2012-09-11 MED ORDER — METOPROLOL TARTRATE 12.5 MG HALF TABLET: 12.5000 mg | ORAL_TABLET | Freq: Two times a day (BID) | ORAL | Status: DC

## 2012-09-11 MED ORDER — CYANOCOBALAMIN 1000 MCG/ML IJ SOLN
1000.0000 ug | Freq: Once | INTRAMUSCULAR | Status: AC
  Administered 2012-09-11: 1000 ug via INTRAMUSCULAR
  Filled 2012-09-11: qty 1

## 2012-09-11 MED ORDER — ADULT MULTIVITAMIN W/MINERALS CH: 1.0000 | ORAL_TABLET | Freq: Every day | ORAL | Status: DC

## 2012-09-11 MED ORDER — ASPIRIN 325 MG PO TABS: 325.0000 mg | ORAL_TABLET | Freq: Every day | ORAL | Status: DC

## 2012-09-11 MED ORDER — FOLIC ACID 1 MG PO TABS: 1.0000 mg | ORAL_TABLET | Freq: Every day | ORAL | Status: DC

## 2012-09-11 MED ORDER — HYDRALAZINE HCL 10 MG PO TABS: 10.0000 mg | ORAL_TABLET | Freq: Three times a day (TID) | ORAL | Status: AC

## 2012-09-11 NOTE — Discharge Summary (Signed)
Physician Discharge Summary  Billy Carson ZOX:[id number] DOB: [birth year] DOA: 09/06/2012  PCP: Default, Provider, MD  Admit date: 09/06/2012 Discharge date: 09/11/2012  Time spent: 45 minutes  Recommendations for Outpatient Follow-up:  1. Follow up with [facility] in 1 week 2. Establish PCP and follow up in 1-2 months 3. Follow up with Neurology in 1 month  Recommendations for primary care physician for things to follow:  Blood pressure monitoring and titrate medications as indicated Repeat BMP in 1 week  Discharge Diagnoses:  Active Problems:   CVA (cerebral infarction)   Tobacco abuse   Alcohol abuse   Hypertension  Discharge Condition: stable  Diet recommendation: heart healthy  Filed Weights   09/06/12 1231 09/06/12 1635  Weight: 114.76 kg (253 lb) 80.468 kg (177 lb 6.4 oz)   History of present illness:  Billy Carson is a 67 y.o. male without known medical history, mainly because he does not see a doctor frequently, presents to the emergency room with a chief complaint of left-sided weakness for the past 2 days. States that 2 days ago he has noticed that he is unable to move his left arm and left leg. He did not seek help at that time and only talked about his symptoms with his wife. When he saw that his symptoms are not resolving, decided to present to the emergency room. He denies any past medical history, he does not have a primary care doctor. He is not taking any medications on a chronic basis at home. He has a history of tobacco abuse, has been smoking about a pack every 3 days for his life. Has a history of alcohol use, has been drinking a pint of vodka for all his life. Never had symptoms like this in the past. He denies any headaches, lightheadedness or dizziness. He denies any chest pain. He denies any shortness or breath. He denies any nausea vomiting or diarrhea. For all these started, he was pretty active without any deficits. In the emergency room, he  underwent a CT scan of the head which was suspicious for subacute infarct in the lateral right basal ganglia without evidence of acute hemorrhage  Hospital Course:  Stroke - MRI positive for acute infarct left lentiform nucleus right side. Neurology has been consulted while patient was hospitalized and patient was started on full dose aspirin. Patient was also evaluated by SLP and PT. He had one event when he complained of transient blurry vision which resolved on its own. Repeat CT scan of the brain showed no new acute abnormalities (full read below). He also underwent a 2D echo and carotic doppler studies as part of the stroke workup (full reads below). Telemetry showed sinus rhythm with occasional PVCs. He is to follow up with Neurology as an outpatient.   HTN - patient hypertensive on admission, however he was allowed permissive hypertension initially. He is currently requiring Lisinopril, Metoprolol and Hydralazine. Recommend continuing to monitor his blood pressure after discharge and further medications adjustments be made as clinically indicated. I suspect that the patient has been hypertensive for a long time. Tobacco abuse - patient counseled about cessation, continue nicotine patch Alcohol abuse - patient has not shown significant evidence of withdrawal during his hospitalization. Counseled for cessation.   Procedures:  2D echo Study Conclusions  - Left ventricle: The cavity size was normal. There was moderate concentric hypertrophy. Systolic function was normal. The estimated ejection fraction was in the range of 55% to 60%. Wall motion was normal; there  were no regional wall motion abnormalities. Doppler parameters are consistent with abnormal left ventricular relaxation (grade 1 diastolic dysfunction). - Ventricular septum: Septal motion showed mild dyssynergy. - Aortic valve: Mildly calcified annulus. Trileaflet; mildly thickened leaflets. Trivial regurgitation. - Atrial septum: No defect  or patent foramen ovale was identified based on very limited interrogation. - Tricuspid valve: Physiologic regurgitation. - Pulmonary arteries: Systolic pressure could not be accurately estimated. - Inferior vena cava: Not visualized. - Pericardium, extracardiac: A prominent pericardial fat pad was present.   Impressions: - No prior study for comparison. Moderate LVH with LVEF 55-60%, mild septal dyssynergy, grade 1 diastolic dysfunction. Trace aortic regurgitation. No obvious ASD or PFO based on limited interrogation.   Consultations:  Neurology  Discharge Exam: Filed Vitals:   09/11/12 0904 09/11/12 1027 09/11/12 1248 09/11/12 1341  BP: 166/99 164/82 162/83 145/87  Pulse:  71 80 76  Temp:    98.3 F (36.8 C)  TempSrc:    Oral  Resp:    20  Height:      Weight:      SpO2:    98%   General: NAD Cardiovascular: RRR Respiratory: CTA biL Neuro: left sided weakness upper and lower extremity, 2/5  Discharge Instructions   Medication List         aspirin 325 MG tablet  Take 1 tablet (325 mg total) by mouth daily.     folic acid 1 MG tablet  Commonly known as:  FOLVITE  Take 1 tablet (1 mg total) by mouth daily.     hydrALAZINE 10 MG tablet  Commonly known as:  APRESOLINE  Take 1 tablet (10 mg total) by mouth every 8 (eight) hours.     lisinopril 40 MG tablet  Commonly known as:  PRINIVIL,ZESTRIL  Take 0.5 tablets (20 mg total) by mouth daily.  Start taking on:  09/12/2012     metoprolol tartrate 12.5 mg Tabs tablet  Commonly known as:  LOPRESSOR  Take 0.5 tablets (12.5 mg total) by mouth 2 (two) times daily.     multivitamin with minerals Tabs tablet  Take 1 tablet by mouth daily.     nicotine 7 mg/24hr patch  Commonly known as:  NICODERM CQ - dosed in mg/24 hr  Place 1 patch onto the skin daily.       Follow-up Information   Follow up with [facility] in 3-4 days. Schedule an appointment as soon as possible for a visit in 4 days.      Follow up with [facility],  KOFI, MD. Schedule an appointment as soon as possible for a visit in 4 weeks.   Specialty:  Neurology   Contact information:   [address] [place] [address] [phone]       The results of significant diagnostics from this hospitalization (including imaging, microbiology, ancillary and laboratory) are listed below for reference.    Significant Diagnostic Studies: Ct Head Wo Contrast  09/09/2012   CLINICAL DATA:  67 year old male with new each visual changes. Recent acute lacunar infarcts.  EXAM: CT HEAD WITHOUT CONTRAST  TECHNIQUE: Contiguous axial images were obtained from the base of the skull through the vertex without intravenous contrast.  COMPARISON:  Brain MRI without contrast 09/06/2012. Head CT without contrast 09/06/2012.  FINDINGS: Visualized paranasal sinuses and mastoids are clear. No acute osseous abnormality identified. Stable and negative orbit and scalp soft tissues. Calcified atherosclerosis at the skull base.  Expected evolution of hypodensity associated with the right lentiform nuclei lacunar infarct.  No associated mass effect or hemorrhage. Superimposed other deep gray matter nuclei hypodensity is also stable and was chronic by MRI.  Stable bilateral PCA territory gray-white matter differentiation. Stable posterior fossa gray-white matter differentiation. Cerebral white matter hypodensity not significantly changed. Ventricular size and configuration are within normal limits. No midline shift, mass effect, or evidence of intracranial mass lesion. No suspicious intracranial vascular hyperdensity. No acute intracranial hemorrhage identified.  IMPRESSION: 1. Expected evolution of right lentiform nucleus lacunar infarct with no mass effect or hemorrhage.  2. Otherwise stable non contrast CT appearance of the brain; the punctate PCA territory lacunar infarcts suggested on the recent MRI remain occult on CT.   Electronically Signed   By: Augusto Gamble   On: 09/09/2012 14:49    Ct Head Wo Contrast  09/06/2012   *RADIOLOGY REPORT*  Clinical Data: Left-sided weakness.  CT HEAD WITHOUT CONTRAST  Technique:  Contiguous axial images were obtained from the base of the skull through the vertex without contrast.  Comparison: None.  Findings: There are scattered low density areas in the basal ganglia and white matter tracts bilaterally.  There is a rounded low density area in the lateral right basal ganglia which is age indeterminate and could represent a subacute infarct.  Extensive low density in the subcortical and periventricular white matter suggests chronic small vessel disease.  There is no evidence for acute hemorrhage, mass lesion, midline shift or hydrocephalus. Visualized sinuses are clear.  No acute bony abnormality.  IMPRESSION: Suspicious low density in the lateral right basal ganglia. Findings may represent a subacute infarct.  No evidence for acute hemorrhage.  Scattered low density areas in the left basal ganglia may represent age indeterminate lacunar infarcts.  In addition, the patient has evidence for chronic small vessel ischemic changes.  These results were called by telephone on 09/06/2012 at 1:16 p.m. to Dr. Gwendolyn Grant, who verbally acknowledged these results.   Original Report Authenticated By: Richarda Overlie, M.D.   Mr Maxine Glenn Head Wo Contrast  09/06/2012   CLINICAL DATA:  67 year old male with left-sided weakness. Inability to move left extremities. Stroke.  EXAM: MRI HEAD WITHOUT CONTRAST  MRA HEAD WITHOUT CONTRAST  TECHNIQUE: Multiplanar, multiecho pulse sequences of the brain and surrounding structures were obtained without intravenous contrast. Angiographic images of the head were obtained using MRA technique without contrast.  COMPARISON:  Head CT without contrast 09/06/2012.  FINDINGS: MRI HEAD FINDINGS  Confluent restricted diffusion in the posterior right lentiform nuclei probably also affecting the posterior limb of the right internal and external capsules. This  infarct does extend toward the right Corona radiata.  In addition, there are punctate areas of restricted diffusion in the left posterior hemisphere along the parieto-occipital sulcus (series 3, image 39) and also in the right occipital lobe.  No associated mass effect or hemorrhage with these findings. Major intracranial vascular flow voids are preserved.  Underlying numerous chronic lacunar infarcts in the bilateral deep gray matter nuclei, cerebellar hemispheres, and patchy and confluent bilateral cerebral white matter T2 and FLAIR hyperintensity. There is a chronic micro hemorrhage in the left posterior temporal lobe (series 13, image 13).  Ventricular size and configuration are within normal limits. #2 No midline shift, mass effect, or evidence of intracranial mass lesion. Negative pituitary, cervicomedullary junction visualized cervical spine. Normal bone marrow signal.  Visualized orbit soft tissues are within normal limits. Minor paranasal sinus mucosal thickening. Mastoids are clear. Negative scalp soft tissues.  MRA HEAD FINDINGS  Antegrade flow in the posterior circulation.  Codominant distal vertebral arteries. Patent left PICA origin. Patent vertebrobasilar junction. No basilar artery stenosis. SCA and PCA origins are patent. Left posterior communicating artery is present, the right is diminutive or absent. There is mild focal irregularity of the right PCA P1 segment (series 106, image 26). Otherwise the bilateral PCA branches are within normal limits.  Antegrade flow in both ICA siphons. No ICA stenosis. Ophthalmic and left posterior communicating artery origins are within normal limits. Small infundibula at the right ICA terminus suspected.  Carotid termini are patent with normal MCA and ACA origins. Anterior communicating artery and proximal ACA branches are within normal limits. Questionable moderate irregularity and stenosis of the bilateral ACA is at the callosum marginal artery level. Bilateral  MCA M1 segments are patent without stenosis. Both MCA bifurcations are patent. Visualized bilateral MCA branches are patent with mild branch irregularity.  IMPRESSION: MRI HEAD IMPRESSION  1. Acute lacunar infarct of the right lentiform nuclei, and likely affecting the posterior limbs of the right internal and external capsules. No associated mass effect or hemorrhage.  2. Underlying advanced chronic small vessel ischemia, such that punctate acute infarcts also noted in the bilateral PCA territories probably reflect synchronous small vessel disease.  MRA HEAD IMPRESSION  1. Mild irregularity of the right PCA P1 segment which could be related to the acute MRI findings on the basis of right thalamus striate artery origin involvement.  2. Otherwise mild intracranial atherosclerosis. Questionable moderate stenoses of the ACA callosomarginal arteries, versus artifact.   Electronically Signed   By: Augusto Gamble   On: 09/06/2012 18:19   Mri Brain Without Contrast  09/06/2012   CLINICAL DATA:  67 year old male with left-sided weakness. Inability to move left extremities. Stroke.  EXAM: MRI HEAD WITHOUT CONTRAST  MRA HEAD WITHOUT CONTRAST  TECHNIQUE: Multiplanar, multiecho pulse sequences of the brain and surrounding structures were obtained without intravenous contrast. Angiographic images of the head were obtained using MRA technique without contrast.  COMPARISON:  Head CT without contrast 09/06/2012.  FINDINGS: MRI HEAD FINDINGS  Confluent restricted diffusion in the posterior right lentiform nuclei probably also affecting the posterior limb of the right internal and external capsules. This infarct does extend toward the right Corona radiata.  In addition, there are punctate areas of restricted diffusion in the left posterior hemisphere along the parieto-occipital sulcus (series 3, image 39) and also in the right occipital lobe.  No associated mass effect or hemorrhage with these findings. Major intracranial vascular flow  voids are preserved.  Underlying numerous chronic lacunar infarcts in the bilateral deep gray matter nuclei, cerebellar hemispheres, and patchy and confluent bilateral cerebral white matter T2 and FLAIR hyperintensity. There is a chronic micro hemorrhage in the left posterior temporal lobe (series 13, image 13).  Ventricular size and configuration are within normal limits. #2 No midline shift, mass effect, or evidence of intracranial mass lesion. Negative pituitary, cervicomedullary junction visualized cervical spine. Normal bone marrow signal.  Visualized orbit soft tissues are within normal limits. Minor paranasal sinus mucosal thickening. Mastoids are clear. Negative scalp soft tissues.  MRA HEAD FINDINGS  Antegrade flow in the posterior circulation. Codominant distal vertebral arteries. Patent left PICA origin. Patent vertebrobasilar junction. No basilar artery stenosis. SCA and PCA origins are patent. Left posterior communicating artery is present, the right is diminutive or absent. There is mild focal irregularity of the right PCA P1 segment (series 106, image 26). Otherwise the bilateral PCA branches are within normal limits.  Antegrade flow in both ICA siphons.  No ICA stenosis. Ophthalmic and left posterior communicating artery origins are within normal limits. Small infundibula at the right ICA terminus suspected.  Carotid termini are patent with normal MCA and ACA origins. Anterior communicating artery and proximal ACA branches are within normal limits. Questionable moderate irregularity and stenosis of the bilateral ACA is at the callosum marginal artery level. Bilateral MCA M1 segments are patent without stenosis. Both MCA bifurcations are patent. Visualized bilateral MCA branches are patent with mild branch irregularity.  IMPRESSION: MRI HEAD IMPRESSION  1. Acute lacunar infarct of the right lentiform nuclei, and likely affecting the posterior limbs of the right internal and external capsules. No  associated mass effect or hemorrhage.  2. Underlying advanced chronic small vessel ischemia, such that punctate acute infarcts also noted in the bilateral PCA territories probably reflect synchronous small vessel disease.  MRA HEAD IMPRESSION  1. Mild irregularity of the right PCA P1 segment which could be related to the acute MRI findings on the basis of right thalamus striate artery origin involvement.  2. Otherwise mild intracranial atherosclerosis. Questionable moderate stenoses of the ACA callosomarginal arteries, versus artifact.   Electronically Signed   By: Augusto Gamble   On: 09/06/2012 18:19   US Carotid Duplex Bilateral  09/06/2012   *RADIOLOGY REPORT*  Clinical Data: Recent stroke  BILATERAL CAROTID DUPLEX ULTRASOUND  Technique: Wallace Cullens scale imaging, color Doppler and duplex ultrasound were performed of bilateral carotid and vertebral arteries in the neck.  Comparison:  None.  Criteria:  Quantification of carotid stenosis is based on velocity parameters that correlate the residual internal carotid diameter with NASCET-based stenosis levels, using the diameter of the distal internal carotid lumen as the denominator for stenosis measurement.  The following velocity measurements were obtained:                   PEAK SYSTOLIC/END DIASTOLIC RIGHT ICA:                        59/18cm/sec CCA:                        46/7cm/sec SYSTOLIC ICA/CCA RATIO:     1.29 DIASTOLIC ICA/CCA RATIO:    2.46 ECA:                        39cm/sec  LEFT ICA:                        76/20cm/sec CCA:                        53/9cm/sec SYSTOLIC ICA/CCA RATIO:     1.44 DIASTOLIC ICA/CCA RATIO:    2.24 ECA:                        37cm/sec  Findings:  RIGHT CAROTID ARTERY: Mild plaque formation is noted within the proximal right internal carotid artery.  The waveforms, velocities and flow velocity ratios however demonstrate no evidence of focal hemodynamically significant stenosis.  RIGHT VERTEBRAL ARTERY:  Antegrade in nature.  LEFT CAROTID  ARTERY: Mild plaque formation is noted within the proximal left internal carotid artery.  The waveforms, velocities and flow velocity ratios however demonstrate no evidence of focal hemodynamically significant stenosis.  LEFT VERTEBRAL ARTERY:  Antegrade in nature.  IMPRESSION: Mild plaque without focal significant stenosis.   Original  Report Authenticated By: Alcide Clever, M.D.   Labs: Basic Metabolic Panel:  Recent Labs Lab 09/06/12 1238 09/06/12 1515 09/07/12 1610 09/08/12 0622 09/09/12 0621 09/09/12 1413 09/11/12 0542  NA 139  --  140 139 136  --  137  K 3.9  --  3.4* 3.3* 3.3* 3.3* 3.7  3.7  CL 94*  --  99 101 101  --  102  CO2 27  --  27 28 27   --  25  GLUCOSE 131*  --  114* 114* 111*  --  102*  BUN 17  --  26* 20 12  --  9  CREATININE 1.04  --  1.14 1.08 0.94  --  1.04  CALCIUM 10.3  --  9.7 9.2 9.3  --  9.5  MG  --  2.1  --   --   --   --   --   PHOS  --  3.3  --   --   --   --   --    CBC:  Recent Labs Lab 09/06/12 1238 09/07/12 0517  WBC 8.3 7.7  NEUTROABS 6.1  --   HGB 15.7 14.6  HCT 45.6 43.3  MCV 103.9* 104.8*  PLT 232 224   Cardiac Enzymes:  Recent Labs Lab 09/06/12 1238  CKTOTAL 280*  TROPONINI <0.30   CBG:  Recent Labs Lab 09/10/12 1206 09/10/12 1638 09/10/12 2052 09/11/12 0738 09/11/12 1124  GLUCAP 103* 93 92 95 92    Signed:  Rohnan Bartleson  [facility] 09/11/2012, 2:05 PM

## 2012-09-11 NOTE — Clinical Social Work Placement (Signed)
Clinical Social Work Department CLINICAL SOCIAL WORK PLACEMENT NOTE 09/11/2012  Patient:  JAYANT, KRIZ  Account Number:  [id number] Admit date:  09/06/2012  Clinical Social Worker:  Santa Genera, CLINICAL SOCIAL WORKER  Date/time:  09/07/2012 11:15 AM  Clinical Social Work is seeking post-discharge placement for this patient at the following level of care:   SKILLED NURSING   (*CSW will update this form in Epic as items are completed)   09/07/2012  Patient/family provided with [facility] Department of Clinical Social Work's list of facilities offering this level of care within the geographic area requested by the patient (or if unable, by the patient's family).  09/07/2012  Patient/family informed of their freedom to choose among providers that offer the needed level of care, that participate in Medicare, Medicaid or managed care program needed by the patient, have an available bed and are willing to accept the patient.  09/07/2012  Patient/family informed of [facility]' ownership interest in [facility], as well as of the fact that they are under no obligation to receive care at this facility.  PASARR submitted to EDS on 09/07/2012 PASARR number received from [employer]DS on 09/07/2012  FL2 transmitted to all facilities in geographic area requested by pt/family on  09/07/2012 FL2 transmitted to all facilities within larger geographic area on   Patient informed that his/her managed care company has contracts with or will negotiate with  certain facilities, including the following:     Patient/family informed of bed offers received:  09/07/2012 Patient chooses bed at [facility] [place] Physician recommends and patient chooses bed at  [facility] [place]  Patient to be transferred to [facility]  on  09/11/2012 Patient to be transferred to facility by family  The following physician request were entered in Epic:   Additional Comments:  Derenda Fennel,  LCSW [phone]

## 2012-09-11 NOTE — Clinical Social Work Note (Signed)
Pt d/c today to [facility]. Pt, pt's daughter, and facility aware and agreeable. Pt to transfer with family. D/C summary faxed.  Derenda Fennel, [place] [phone]

## 2012-09-11 NOTE — Progress Notes (Signed)
Physical Therapy Treatment Patient Details Name: ABDALLA NARAMORE MRN: [id number] DOB: [birth year] Today's Date: 09/11/2012 Time: 4098-1191 PT Time Calculation (min): 36 min  PT Assessment / Plan / Recommendation  History of Present Illness     PT Comments   Pt c/o increased weakness in the LLE this AM.  Aide states that it took 2 people to transfer him from bed to [facility], pt unable to place any weight on the LLE.  His BP was significantly elevated, so it was elected to not mobilize him OOB.  We did initiate gentle strengthening exercise and found the LUE strength to be improved with slight ability to flex and extend the elbow and muscle contraction felt in wrist flexion and extension.  The LLE was weaker with 2- strength and the L gastrocnemius, at the L knee and hip.  However, about one hour later the physician noted that he is now able to do a SLR with the LLE!  Perhaps, the weakness that we saw earlier was emotionally based.  We will try to work with pt again today for mobility.  Follow Up Recommendations        Does the patient have the potential to tolerate intense rehabilitation     Barriers to Discharge        Equipment Recommendations       Recommendations for Other Services    Frequency     Progress towards PT Goals Progress towards PT goals: Not progressing toward goals - comment (LLE appears weaker today, elevated BP)  Plan Current plan remains appropriate (CIR not appropriate-nio d/c plan)    Precautions / Restrictions     Pertinent Vitals/Pain     Mobility  Bed Mobility Bed Mobility: Not assessed Details for Bed Mobility Assistance: BP elevated--166/99///elected to do bed exercise only    Exercises General Exercises - Upper Extremity Shoulder Flexion: AAROM;Right;5 reps;Supine Shoulder Extension: AAROM;Right;5 reps;Supine Shoulder ABduction: AAROM;Right;5 reps;Supine Elbow Flexion: AAROM;Right;5 reps;Supine Elbow Extension: AAROM;Right;Supine Wrist Flexion:  AAROM;Right;5 reps;Supine Wrist Extension: AAROM;Right;5 reps;Supine General Exercises - Lower Extremity Ankle Circles/Pumps: AAROM;PROM;Right;10 reps;Supine Short Arc Quad: AAROM;Right;10 reps;Supine Heel Slides: AAROM;Right;10 reps;Supine (from mid range of hip flexion) Hip ABduction/ADduction: AAROM;Right;10 reps;Supine   PT Diagnosis:    PT Problem List:   PT Treatment Interventions:     PT Goals (current goals can now be found in the care plan section)    Visit Information  Last PT Received On: 09/11/12    Subjective Data      Cognition       Balance     End of Session PT - End of Session Equipment Utilized During Treatment: Gait belt Activity Tolerance: Patient tolerated treatment well Patient left: in bed;with call bell/phone within reach;with bed alarm set Nurse Communication: Mobility status   GP     Konrad Penta 09/11/2012, 10:09 AM

## 2012-09-11 NOTE — Progress Notes (Signed)
IV cath removed and intact. Report called to [facility] and awaiting daughter for transport. No pain or swelling at site. Instructions and paperwork given to daughter and facility.

## 2012-09-11 NOTE — Progress Notes (Signed)
Patient ID: Billy Carson, male   DOB: 07-06-1945, 67 y.o.   MRN: [id number]  [facility] NEUROLOGY Zayne Marovich A. Gerilyn Pilgrim, MD     [email or web address]          Billy Carson is an 67 y.o. male.   Assessment/Plan: 1. 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 Patient has improved clinically. He is continue blood pressure control and antiplatelet agent. We will sign off. Reconsult as needed.  2. Likely due to vitamin B12 deficiency. The patient will be given B12 replacement. The patient reports that he is doing fairly well. He reports improvement in the left side. He did have a repeat scan although the scan shows normal evolution of the infarct. Today's awake and alert. He is lucid and coherent. The left upper extremity has improved and is now 2/5. Left hip flexion also 2/5.    Objective: Vital signs in last 24 hours: Temp:  [98.5 F (36.9 C)-99 F (37.2 C)] 98.5 F (36.9 C) (09/02 0457) Pulse Rate:  [64-84] 69 (09/02 0800) Resp:  [20] 20 (09/02 0457) BP: (175-208)/(91-117) 186/93 mmHg (09/02 0800) SpO2:  [98 %-100 %] 98 % (09/02 0457)  Intake/Output from previous day: 09/01 0701 - 09/02 0700 In: 1590 [P.O.:360; I.V.:1230] Out: 1650 [Urine:1150] Intake/Output this shift:   Nutritional status: Dysphagia   Lab Results: Results for orders placed during the hospital encounter of 09/06/12 (from the past 48 hour(s))  GLUCOSE, CAPILLARY     Status: Abnormal   Collection Time    09/09/12 11:17 AM      Result Value Range   Glucose-Capillary 110 (*) 70 - 99 mg/dL   Comment 1 Notify RN     Comment 2 Documented in Chart    POTASSIUM     Status: Abnormal   Collection Time    09/09/12  2:13 PM      Result Value Range   Potassium 3.3 (*) 3.5 - 5.1 mEq/L  GLUCOSE, CAPILLARY     Status: None   Collection Time    09/09/12  4:31 PM      Result Value Range   Glucose-Capillary 93  70 - 99 mg/dL   Comment 1 Notify RN     Comment 2  Documented in Chart    GLUCOSE, CAPILLARY     Status: None   Collection Time    09/10/12  7:07 AM      Result Value Range   Glucose-Capillary 97  70 - 99 mg/dL  GLUCOSE, CAPILLARY     Status: Abnormal   Collection Time    09/10/12 12:06 PM      Result Value Range   Glucose-Capillary 103 (*) 70 - 99 mg/dL  GLUCOSE, CAPILLARY     Status: None   Collection Time    09/10/12  4:38 PM      Result Value Range   Glucose-Capillary 93  70 - 99 mg/dL   Comment 1 Notify RN    GLUCOSE, CAPILLARY     Status: None   Collection Time    09/10/12  8:52 PM      Result Value Range   Glucose-Capillary 92  70 - 99 mg/dL  POTASSIUM     Status: None   Collection Time    09/11/12  5:42 AM      Result Value Range   Potassium 3.7  3.5 - 5.1 mEq/L  GLUCOSE, CAPILLARY     Status: None   Collection Time    09/11/12  7:38 AM      Result Value Range   Glucose-Capillary 95  70 - 99 mg/dL   Comment 1 Notify RN     Comment 2 Documented in Chart      Lipid Panel No results found for this basename: CHOL, TRIG, HDL, CHOLHDL, VLDL, LDLCALC,  in the last 72 hours  Studies/Results: Ct Head Wo Contrast  09/09/2012   CLINICAL DATA:  67 year old male with new each visual changes. Recent acute lacunar infarcts.  EXAM: CT HEAD WITHOUT CONTRAST  TECHNIQUE: Contiguous axial images were obtained from the base of the skull through the vertex without intravenous contrast.  COMPARISON:  Brain MRI without contrast 09/06/2012. Head CT without contrast 09/06/2012.  FINDINGS: Visualized paranasal sinuses and mastoids are clear. No acute osseous abnormality identified. Stable and negative orbit and scalp soft tissues. Calcified atherosclerosis at the skull base.  Expected evolution of hypodensity associated with the right lentiform nuclei lacunar infarct. No associated mass effect or hemorrhage. Superimposed other deep gray matter nuclei hypodensity is also stable and was chronic by MRI.  Stable bilateral PCA territory gray-white  matter differentiation. Stable posterior fossa gray-white matter differentiation. Cerebral white matter hypodensity not significantly changed. Ventricular size and configuration are within normal limits. No midline shift, mass effect, or evidence of intracranial mass lesion. No suspicious intracranial vascular hyperdensity. No acute intracranial hemorrhage identified.  IMPRESSION: 1. Expected evolution of right lentiform nucleus lacunar infarct with no mass effect or hemorrhage.  2. Otherwise stable non contrast CT appearance of the brain; the punctate PCA territory lacunar infarcts suggested on the recent MRI remain occult on CT.   Electronically Signed   By: Augusto Gamble   On: 09/09/2012 14:49    Medications:  Scheduled Meds: . amLODipine  5 mg Oral Daily  . aspirin  325 mg Oral Daily  . folic acid  1 mg Oral Daily  . heparin  5,000 Units Subcutaneous Q8H  . hydrALAZINE  10 mg Oral Q8H  . [START ON 09/12/2012] lisinopril  40 mg Oral Daily  . metoprolol tartrate  12.5 mg Oral BID  . multivitamin with minerals  1 tablet Oral Daily  . nicotine  7 mg Transdermal Daily  . senna-docusate  1 tablet Oral BID  . thiamine  100 mg Oral Daily   Or  . thiamine  100 mg Intravenous Daily   Continuous Infusions: . sodium chloride 50 mL/hr at 09/10/12 0251   PRN Meds:.hydrALAZINE    LOS: 5 days   Daven Montz A. Gerilyn Pilgrim, M.D.  Diplomate, Biomedical engineer of Psychiatry and Neurology ( Neurology).

## 2012-09-11 NOTE — Clinical Social Work Note (Signed)
CSW met w patient at bedside, patient confirmed that he wants to go to [facility] for rehab at discharge.  [facility] admissions willing to accept patient, they are contacting daughter to sign preadmission paperwork.  Santa Genera, LCSW Clinical Social Worker [phone])

## 2013-10-02 NOTE — Patient Instructions (Signed)
Your procedure is scheduled on: 10/14/2013  Report to [facility] at  800  AM.  Call this number if you have problems the morning of surgery: [phone]   Do not eat food or drink liquids :After Midnight.      Take these medicines the morning of surgery with A SIP OF WATER: lisinopril, apresoline, metoprolol   Do not wear jewelry, make-up or nail polish.  Do not wear lotions, powders, or perfumes.   Do not shave 48 hours prior to surgery.  Do not bring valuables to the hospital.  Contacts, dentures or bridgework may not be worn into surgery.  Leave suitcase in the car. After surgery it may be brought to your room.  For patients admitted to the hospital, checkout time is 11:00 AM the day of discharge.   Patients discharged the day of surgery will not be allowed to drive home.  :     Please read over the following fact sheets that you were given: Coughing and Deep Breathing, Surgical Site Infection Prevention, Anesthesia Post-op Instructions and Care and Recovery After Surgery    Cataract A cataract is a clouding of the lens of the eye. When a lens becomes cloudy, vision is reduced based on the degree and nature of the clouding. Many cataracts reduce vision to some degree. Some cataracts make people more near-sighted as they develop. Other cataracts increase glare. Cataracts that are ignored and become worse can sometimes look white. The white color can be seen through the pupil. CAUSES   Aging. However, cataracts may occur at any age, even in newborns.   Certain drugs.   Trauma to the eye.   Certain diseases such as diabetes.   Specific eye diseases such as chronic inflammation inside the eye or a sudden attack of a rare form of glaucoma.   Inherited or acquired medical problems.  SYMPTOMS   Gradual, progressive drop in vision in the affected eye.   Severe, rapid visual loss. This most often happens when trauma is the cause.  DIAGNOSIS  To detect a cataract, an eye doctor  examines the lens. Cataracts are best diagnosed with an exam of the eyes with the pupils enlarged (dilated) by drops.  TREATMENT  For an early cataract, vision may improve by using different eyeglasses or stronger lighting. If that does not help your vision, surgery is the only effective treatment. A cataract needs to be surgically removed when vision loss interferes with your everyday activities, such as driving, reading, or watching TV. A cataract may also have to be removed if it prevents examination or treatment of another eye problem. Surgery removes the cloudy lens and usually replaces it with a substitute lens (intraocular lens, IOL).  At a time when both you and your doctor agree, the cataract will be surgically removed. If you have cataracts in both eyes, only one is usually removed at a time. This allows the operated eye to heal and be out of danger from any possible problems after surgery (such as infection or poor wound healing). In rare cases, a cataract may be doing damage to your eye. In these cases, your caregiver may advise surgical removal right away. The vast majority of people who have cataract surgery have better vision afterward. HOME CARE INSTRUCTIONS  If you are not planning surgery, you may be asked to do the following:  Use different eyeglasses.   Use stronger or brighter lighting.   Ask your eye doctor about reducing your medicine dose or changing  medicines if it is thought that a medicine caused your cataract. Changing medicines does not make the cataract go away on its own.   Become familiar with your surroundings. Poor vision can lead to injury. Avoid bumping into things on the affected side. You are at a higher risk for tripping or falling.   Exercise extreme care when driving or operating machinery.   Wear sunglasses if you are sensitive to bright light or experiencing problems with glare.  SEEK IMMEDIATE MEDICAL CARE IF:   You have a worsening or sudden vision  loss.   You notice redness, swelling, or increasing pain in the eye.   You have a fever.  Document Released: 12/27/2004 Document Revised: 12/16/2010 Document Reviewed: 08/20/2010 [facility] Patient Information 2012 [place].PATIENT INSTRUCTIONS POST-ANESTHESIA  IMMEDIATELY FOLLOWING SURGERY:  Do not drive or operate machinery for the first twenty four hours after surgery.  Do not make any important decisions for twenty four hours after surgery or while taking narcotic pain medications or sedatives.  If you develop intractable nausea and vomiting or a severe headache please notify your doctor immediately.  FOLLOW-UP:  Please make an appointment with your surgeon as instructed. You do not need to follow up with anesthesia unless specifically instructed to do so.  WOUND CARE INSTRUCTIONS (if applicable):  Keep a dry clean dressing on the anesthesia/puncture wound site if there is drainage.  Once the wound has quit draining you may leave it open to air.  Generally you should leave the bandage intact for twenty four hours unless there is drainage.  If the epidural site drains for more than 36-48 hours please call the anesthesia department.  QUESTIONS?:  Please feel free to call your physician or the hospital operator if you have any questions, and they will be happy to assist you.

## 2013-10-03 ENCOUNTER — Encounter (HOSPITAL_COMMUNITY): Payer: Self-pay | Admitting: Pharmacy Technician

## 2013-10-03 ENCOUNTER — Encounter (HOSPITAL_COMMUNITY): Payer: Self-pay

## 2013-10-03 ENCOUNTER — Encounter (HOSPITAL_COMMUNITY)
Admission: RE | Admit: 2013-10-03 | Discharge: 2013-10-03 | Disposition: A | Discharge status: Home / Self Care | Payer: PRIVATE HEALTH INSURANCE | Source: Ambulatory Visit | Attending: Ophthalmology | Admitting: Ophthalmology

## 2013-10-03 DIAGNOSIS — H2510 Age-related nuclear cataract, unspecified eye: Secondary | ICD-10-CM | POA: Diagnosis present

## 2013-10-03 DIAGNOSIS — Z01812 Encounter for preprocedural laboratory examination: Secondary | ICD-10-CM | POA: Diagnosis not present

## 2013-10-03 DIAGNOSIS — Z01810 Encounter for preprocedural cardiovascular examination: Secondary | ICD-10-CM | POA: Insufficient documentation

## 2013-10-03 HISTORY — DX: Hyperlipidemia, unspecified: E78.5

## 2013-10-03 HISTORY — DX: Essential (primary) hypertension: I10

## 2013-10-03 HISTORY — DX: Cerebral infarction, unspecified: I63.9

## 2013-10-03 LAB — BASIC METABOLIC PANEL
Anion gap: 11 (ref 5–15)
BUN: 14 mg/dL (ref 6–23)
CALCIUM: 9.7 mg/dL (ref 8.4–10.5)
CO2: 28 mEq/L (ref 19–32)
CREATININE: 1.18 mg/dL (ref 0.50–1.35)
Chloride: 103 mEq/L (ref 96–112)
GFR, EST AFRICAN AMERICAN: 70 mL/min — AB (ref >90)
GFR, EST NON AFRICAN AMERICAN: 61 mL/min — AB (ref >90)
GLUCOSE: 91 mg/dL (ref 70–99)
Potassium: 4.5 mEq/L (ref 3.7–5.3)
Sodium: 142 mEq/L (ref 137–147)

## 2013-10-03 LAB — HEMOGLOBIN AND HEMATOCRIT, BLOOD
HCT: 36.8 % — ABNORMAL LOW (ref 39.0–52.0)
HEMOGLOBIN: 12.3 g/dL — AB (ref 13.0–17.0)

## 2013-10-03 NOTE — Pre-Procedure Instructions (Signed)
Pt. Given info for MyChart. To be set up at home. 

## 2013-10-11 MED ORDER — CYCLOPENTOLATE-PHENYLEPHRINE OP SOLN OPTIME - NO CHARGE
OPHTHALMIC | Status: AC
  Filled 2013-10-11: qty 2

## 2013-10-11 MED ORDER — TETRACAINE HCL 0.5 % OP SOLN
OPHTHALMIC | Status: AC
  Filled 2013-10-11: qty 2

## 2013-10-11 MED ORDER — LIDOCAINE HCL 3.5 % OP GEL
OPHTHALMIC | Status: AC
  Filled 2013-10-11: qty 1

## 2013-10-14 ENCOUNTER — Encounter (HOSPITAL_COMMUNITY): Payer: PRIVATE HEALTH INSURANCE | Admitting: Anesthesiology

## 2013-10-14 ENCOUNTER — Encounter (HOSPITAL_COMMUNITY)
Admission: RE | Disposition: A | Discharge status: Skilled Nursing Facility | Payer: Self-pay | Source: Ambulatory Visit | Attending: Ophthalmology

## 2013-10-14 ENCOUNTER — Ambulatory Visit (HOSPITAL_COMMUNITY)
Admission: RE | Admit: 2013-10-14 | Discharge: 2013-10-14 | Disposition: A | Discharge status: Skilled Nursing Facility | Payer: PRIVATE HEALTH INSURANCE | Source: Ambulatory Visit | Attending: Ophthalmology | Admitting: Ophthalmology

## 2013-10-14 ENCOUNTER — Encounter (HOSPITAL_COMMUNITY): Payer: Self-pay | Admitting: *Deleted

## 2013-10-14 ENCOUNTER — Ambulatory Visit (HOSPITAL_COMMUNITY): Payer: PRIVATE HEALTH INSURANCE | Admitting: Anesthesiology

## 2013-10-14 DIAGNOSIS — H269 Unspecified cataract: Secondary | ICD-10-CM | POA: Insufficient documentation

## 2013-10-14 DIAGNOSIS — I10 Essential (primary) hypertension: Secondary | ICD-10-CM | POA: Diagnosis not present

## 2013-10-14 DIAGNOSIS — Z87891 Personal history of nicotine dependence: Secondary | ICD-10-CM | POA: Diagnosis not present

## 2013-10-14 HISTORY — PX: CATARACT EXTRACTION W/PHACO: SHX586

## 2013-10-14 SURGERY — PHACOEMULSIFICATION, CATARACT, WITH IOL INSERTION
Anesthesia: Monitor Anesthesia Care | Laterality: Left

## 2013-10-14 MED ORDER — TETRACAINE HCL 0.5 % OP SOLN
1.0000 [drp] | OPHTHALMIC | Status: AC
  Administered 2013-10-14 (×3): 1 [drp] via OPHTHALMIC

## 2013-10-14 MED ORDER — POVIDONE-IODINE 5 % OP SOLN
OPHTHALMIC | Status: DC | PRN
  Administered 2013-10-14: 1 via OPHTHALMIC

## 2013-10-14 MED ORDER — MIDAZOLAM HCL 2 MG/2ML IJ SOLN
1.0000 mg | INTRAMUSCULAR | Status: DC | PRN
  Administered 2013-10-14: 2 mg via INTRAVENOUS

## 2013-10-14 MED ORDER — LACTATED RINGERS IV SOLN
INTRAVENOUS | Status: DC
  Administered 2013-10-14: 1000 mL via INTRAVENOUS

## 2013-10-14 MED ORDER — LIDOCAINE HCL 3.5 % OP GEL
OPHTHALMIC | Status: DC | PRN
  Administered 2013-10-14: 1 via OPHTHALMIC

## 2013-10-14 MED ORDER — NA HYALUR & NA CHOND-NA HYALUR 0.55-0.5 ML IO KIT
PACK | INTRAOCULAR | Status: DC | PRN
  Administered 2013-10-14: 1 via OPHTHALMIC

## 2013-10-14 MED ORDER — FENTANYL CITRATE 0.05 MG/ML IJ SOLN
25.0000 ug | INTRAMUSCULAR | Status: AC
  Administered 2013-10-14 (×2): 25 ug via INTRAVENOUS

## 2013-10-14 MED ORDER — EPINEPHRINE HCL 1 MG/ML IJ SOLN
INTRAMUSCULAR | Status: DC | PRN
  Administered 2013-10-14: 10:00:00

## 2013-10-14 MED ORDER — BSS IO SOLN
INTRAOCULAR | Status: DC | PRN
  Administered 2013-10-14: 15 mL

## 2013-10-14 MED ORDER — MIDAZOLAM HCL 2 MG/2ML IJ SOLN
INTRAMUSCULAR | Status: AC
  Filled 2013-10-14: qty 2

## 2013-10-14 MED ORDER — TETRACAINE 0.5 % OP SOLN OPTIME - NO CHARGE
OPHTHALMIC | Status: DC | PRN
  Administered 2013-10-14: 2 [drp] via OPHTHALMIC

## 2013-10-14 MED ORDER — CYCLOPENTOLATE-PHENYLEPHRINE 0.2-1 % OP SOLN
1.0000 [drp] | OPHTHALMIC | Status: AC
  Administered 2013-10-14 (×3): 1 [drp] via OPHTHALMIC

## 2013-10-14 MED ORDER — EPINEPHRINE HCL 1 MG/ML IJ SOLN
INTRAMUSCULAR | Status: AC
  Filled 2013-10-14: qty 1

## 2013-10-14 MED ORDER — FENTANYL CITRATE 0.05 MG/ML IJ SOLN
INTRAMUSCULAR | Status: AC
  Filled 2013-10-14: qty 2

## 2013-10-14 SURGICAL SUPPLY — 9 items
CLOTH BEACON ORANGE TIMEOUT ST (SAFETY) ×3 IMPLANT
GLOVE BIOGEL PI IND STRL 6.5 (GLOVE) ×1 IMPLANT
GLOVE BIOGEL PI IND STRL 7.0 (GLOVE) ×1 IMPLANT
GLOVE BIOGEL PI INDICATOR 6.5 (GLOVE) ×2
GLOVE BIOGEL PI INDICATOR 7.0 (GLOVE) ×2
INST SET CATARACT ~~LOC~~ (KITS) ×3 IMPLANT
PAD ARMBOARD 7.5X6 YLW CONV (MISCELLANEOUS) ×3 IMPLANT
SIGHTPATH CAT PROC W REG LENS (Ophthalmic Related) ×3 IMPLANT
WATER STERILE IRR 250ML POUR (IV SOLUTION) ×3 IMPLANT

## 2013-10-14 NOTE — Anesthesia Preprocedure Evaluation (Signed)
Anesthesia Evaluation  Patient identified by MRN, date of birth, ID band Patient awake    Reviewed: Allergy & Precautions, H&P , NPO status , Patient's Chart, lab work & pertinent test results  Airway Mallampati: II  TM Distance: >3 FB     Dental  (+) Edentulous Upper, Edentulous Lower   Pulmonary former smoker,  breath sounds clear to auscultation        Cardiovascular hypertension, Pt. on medications Rhythm:Regular Rate:Normal     Neuro/Psych CVA, Residual Symptoms    GI/Hepatic (+)     substance abuse  alcohol use,   Endo/Other    Renal/GU      Musculoskeletal   Abdominal   Peds  Hematology   Anesthesia Other Findings   Reproductive/Obstetrics                             Anesthesia Physical Anesthesia Plan  ASA: III  Anesthesia Plan: MAC   Post-op Pain Management:    Induction: Intravenous  Airway Management Planned: Nasal Cannula  Additional Equipment:   Intra-op Plan:   Post-operative Plan:   Informed Consent: I have reviewed the patients History and Physical, chart, labs and discussed the procedure including the risks, benefits and alternatives for the proposed anesthesia with the patient or authorized representative who has indicated his/her understanding and acceptance.     Plan Discussed with:   Anesthesia Plan Comments:         Anesthesia Quick Evaluation  

## 2013-10-14 NOTE — H&P (Signed)
I have reviewed the pre printed H&P, the patient was re-examined, and I have identified no significant interval changes in the patient's medical condition.  There is no change in the plan of care since the history and physical of record. 

## 2013-10-14 NOTE — Op Note (Signed)
See scanned op note 

## 2013-10-14 NOTE — Discharge Instructions (Signed)
Billy Carson 10/14/2013 Dr. Iona Hansen Post operative Instructions for Cataract Patients  These instructions are for Billy Carson and pertain to the operative eye.  1.  Resume your normal diet and previous oral medicines.  2. Your Follow-up appointment is at Dr. Iona Hansen' office in [place] on 10/15/13 at 1015.  3. You may leave the hospital when your driver is present and your nurse releases you.  4. Begin Pred Forte (prednisolone acetate 1%), Acular LS (ketorolac tromethamine .4%) and Gatifloxacin 0.5% eye drops; 1 drop each 4 times daily to operative eye. Begin 3 hours after discharge from Short Stay Unit.  Moxifloxacin 0.5% may be substituted for Gatifloxacin using the same instructions.  56. Page Dr. Iona Hansen via beeper [phone] for significant pain in or around operative eye that is not relieved by Tylenol.  6. If you took Plavix before surgery, restart it at the usual dose on the evening of surgery.  7. Wear dark glasses as necessary for excessive light sensitivity.  8. Do no forcefully rub you your operative eye.  9. Keep your operative eye dry for 1 week. You may gently clean your eyelids with a damp washcloth.  10. You may resume normal occupational activities in one week and resume driving as tolerated after the first post operative visit.  11. It is normal to have blurred vision and a scratchy sensation following surgery.  Dr. Iona Hansen: [phone]

## 2013-10-14 NOTE — Brief Op Note (Signed)
10/14/2013  10:34 AM  PATIENT:  Lilli Light Hitch  68 y.o. male  PRE-OPERATIVE DIAGNOSIS:  Cataract left eye; difficulty performing daily activities  POST-OPERATIVE DIAGNOSIS:  Cataract left eye; difficulty performing daily activities  PROCEDURE:  Procedure(s): CATARACT EXTRACTION PHACO AND INTRAOCULAR LENS PLACEMENT LEFT EYE CDE=11.04  SURGEON:  Surgeon(s): Williams Che, MD  ASSISTANTS: Chuck Hint, CST   ANESTHESIA STAFF: Anesthesiologist: Lerry Liner, MD CRNA: Genevie Ann, CRNA; Ollen Bowl, CRNA  ANESTHESIA:   topical and MAC  REQUESTED LENS POWER: +21.5  LENS IMPLANT INFORMATION:  [employer] [id number]  S/n [id number]  Exp 05/2018  CUMULATIVE DISSIPATED ENERGY:11.04  INDICATIONS:see office H&P  OP FINDINGS:dense NS/PSC  COMPLICATIONS:None  DICTATION #: see scanned op note  PLAN OF CARE: as above  PATIENT DISPOSITION:  Short Stay

## 2013-10-14 NOTE — Transfer of Care (Signed)
Immediate Anesthesia Transfer of Care Note  Patient: Billy Carson  Procedure(s) Performed: Procedure(s): CATARACT EXTRACTION PHACO AND INTRAOCULAR LENS PLACEMENT LEFT EYE CDE=11.04 (Left)  Patient Location: PACU  Anesthesia Type:MAC  Level of Consciousness: awake, alert  and oriented  Airway & Oxygen Therapy: Patient Spontanous Breathing  Post-op Assessment: Report given to PACU RN and Post -op Vital signs reviewed and stable  Post vital signs: Reviewed and stable  Complications: No apparent anesthesia complications

## 2013-10-15 NOTE — Anesthesia Postprocedure Evaluation (Signed)
  Anesthesia Post-op Note  Patient: Billy Carson  Procedure(s) Performed: Procedure(s) (LRB): CATARACT EXTRACTION PHACO AND INTRAOCULAR LENS PLACEMENT LEFT EYE CDE=11.04 (Left)  Patient Location:  Short Stay  Anesthesia Type: MAC  Level of Consciousness: awake  Airway and Oxygen Therapy: Patient Spontanous Breathing  Post-op Pain: none  Post-op Assessment: Post-op Vital signs reviewed, Patient's Cardiovascular Status Stable, Respiratory Function Stable, Patent Airway, No signs of Nausea or vomiting and Pain level controlled  Post-op Vital Signs: Reviewed and stable  Complications: No apparent anesthesia complications. Late entry [phone]  T. Mahathi Pokorney CRNA

## 2013-10-17 ENCOUNTER — Encounter (HOSPITAL_COMMUNITY): Payer: Self-pay | Admitting: Ophthalmology

## 2013-12-23 MED ORDER — ONDANSETRON HCL 4 MG/2ML IJ SOLN: 4.0000 mg | Freq: Once | INTRAMUSCULAR | Status: AC | PRN

## 2013-12-23 MED ORDER — FENTANYL CITRATE 0.05 MG/ML IJ SOLN: 25.0000 ug | INTRAMUSCULAR | Status: DC | PRN

## 2013-12-23 NOTE — Patient Instructions (Signed)
Your procedure is scheduled on: 12/30/2013  Report to [facility] at  700  AM.  Call this number if you have problems the morning of surgery: [phone]   Do not eat food or drink liquids :After Midnight.      Take these medicines the morning of surgery with A SIP OF WATER: metoprolol, lisinopril, hydralazine   Do not wear jewelry, make-up or nail polish.  Do not wear lotions, powders, or perfumes.   Do not shave 48 hours prior to surgery.  Do not bring valuables to the hospital.  Contacts, dentures or bridgework may not be worn into surgery.  Leave suitcase in the car. After surgery it may be brought to your room.  For patients admitted to the hospital, checkout time is 11:00 AM the day of discharge.   Patients discharged the day of surgery will not be allowed to drive home.  :     Please read over the following fact sheets that you were given: Coughing and Deep Breathing, Surgical Site Infection Prevention, Anesthesia Post-op Instructions and Care and Recovery After Surgery    Cataract A cataract is a clouding of the lens of the eye. When a lens becomes cloudy, vision is reduced based on the degree and nature of the clouding. Many cataracts reduce vision to some degree. Some cataracts make people more near-sighted as they develop. Other cataracts increase glare. Cataracts that are ignored and become worse can sometimes look white. The white color can be seen through the pupil. CAUSES   Aging. However, cataracts may occur at any age, even in newborns.   Certain drugs.   Trauma to the eye.   Certain diseases such as diabetes.   Specific eye diseases such as chronic inflammation inside the eye or a sudden attack of a rare form of glaucoma.   Inherited or acquired medical problems.  SYMPTOMS   Gradual, progressive drop in vision in the affected eye.   Severe, rapid visual loss. This most often happens when trauma is the cause.  DIAGNOSIS  To detect a cataract, an eye doctor  examines the lens. Cataracts are best diagnosed with an exam of the eyes with the pupils enlarged (dilated) by drops.  TREATMENT  For an early cataract, vision may improve by using different eyeglasses or stronger lighting. If that does not help your vision, surgery is the only effective treatment. A cataract needs to be surgically removed when vision loss interferes with your everyday activities, such as driving, reading, or watching TV. A cataract may also have to be removed if it prevents examination or treatment of another eye problem. Surgery removes the cloudy lens and usually replaces it with a substitute lens (intraocular lens, IOL).  At a time when both you and your doctor agree, the cataract will be surgically removed. If you have cataracts in both eyes, only one is usually removed at a time. This allows the operated eye to heal and be out of danger from any possible problems after surgery (such as infection or poor wound healing). In rare cases, a cataract may be doing damage to your eye. In these cases, your caregiver may advise surgical removal right away. The vast majority of people who have cataract surgery have better vision afterward. HOME CARE INSTRUCTIONS  If you are not planning surgery, you may be asked to do the following:  Use different eyeglasses.   Use stronger or brighter lighting.   Ask your eye doctor about reducing your medicine dose or changing  medicines if it is thought that a medicine caused your cataract. Changing medicines does not make the cataract go away on its own.   Become familiar with your surroundings. Poor vision can lead to injury. Avoid bumping into things on the affected side. You are at a higher risk for tripping or falling.   Exercise extreme care when driving or operating machinery.   Wear sunglasses if you are sensitive to bright light or experiencing problems with glare.  SEEK IMMEDIATE MEDICAL CARE IF:   You have a worsening or sudden vision  loss.   You notice redness, swelling, or increasing pain in the eye.   You have a fever.  Document Released: 12/27/2004 Document Revised: 12/16/2010 Document Reviewed: 08/20/2010 [facility] Patient Information 2012 [place].PATIENT INSTRUCTIONS POST-ANESTHESIA  IMMEDIATELY FOLLOWING SURGERY:  Do not drive or operate machinery for the first twenty four hours after surgery.  Do not make any important decisions for twenty four hours after surgery or while taking narcotic pain medications or sedatives.  If you develop intractable nausea and vomiting or a severe headache please notify your doctor immediately.  FOLLOW-UP:  Please make an appointment with your surgeon as instructed. You do not need to follow up with anesthesia unless specifically instructed to do so.  WOUND CARE INSTRUCTIONS (if applicable):  Keep a dry clean dressing on the anesthesia/puncture wound site if there is drainage.  Once the wound has quit draining you may leave it open to air.  Generally you should leave the bandage intact for twenty four hours unless there is drainage.  If the epidural site drains for more than 36-48 hours please call the anesthesia department.  QUESTIONS?:  Please feel free to call your physician or the hospital operator if you have any questions, and they will be happy to assist you.

## 2013-12-24 ENCOUNTER — Encounter (HOSPITAL_COMMUNITY): Payer: Self-pay

## 2013-12-24 ENCOUNTER — Encounter (HOSPITAL_COMMUNITY)
Admission: RE | Admit: 2013-12-24 | Discharge: 2013-12-24 | Disposition: A | Discharge status: Home / Self Care | Payer: PRIVATE HEALTH INSURANCE | Source: Ambulatory Visit | Attending: Ophthalmology | Admitting: Ophthalmology

## 2013-12-24 DIAGNOSIS — Z01812 Encounter for preprocedural laboratory examination: Secondary | ICD-10-CM | POA: Insufficient documentation

## 2013-12-24 LAB — BASIC METABOLIC PANEL
Anion gap: 15 (ref 5–15)
BUN: 13 mg/dL (ref 6–23)
CHLORIDE: 100 meq/L (ref 96–112)
CO2: 26 mEq/L (ref 19–32)
CREATININE: 1.3 mg/dL (ref 0.50–1.35)
Calcium: 9.8 mg/dL (ref 8.4–10.5)
GFR calc non Af Amer: 54 mL/min — ABNORMAL LOW (ref >90)
GFR, EST AFRICAN AMERICAN: 63 mL/min — AB (ref >90)
Glucose, Bld: 102 mg/dL — ABNORMAL HIGH (ref 70–99)
Potassium: 4.2 mEq/L (ref 3.7–5.3)
SODIUM: 141 meq/L (ref 137–147)

## 2013-12-24 LAB — HEMOGLOBIN AND HEMATOCRIT, BLOOD
HEMATOCRIT: 36.1 % — AB (ref 39.0–52.0)
HEMOGLOBIN: 11.9 g/dL — AB (ref 13.0–17.0)

## 2013-12-24 MED ORDER — CYCLOPENTOLATE-PHENYLEPHRINE 0.2-1 % OP SOLN: 1.0000 [drp] | OPHTHALMIC | Status: DC

## 2013-12-30 ENCOUNTER — Encounter (HOSPITAL_COMMUNITY): Payer: Self-pay | Admitting: *Deleted

## 2013-12-30 ENCOUNTER — Ambulatory Visit (HOSPITAL_COMMUNITY): Payer: PRIVATE HEALTH INSURANCE | Admitting: Anesthesiology

## 2013-12-30 ENCOUNTER — Encounter (HOSPITAL_COMMUNITY)
Admission: RE | Disposition: A | Discharge status: Home / Self Care | Payer: Self-pay | Source: Ambulatory Visit | Attending: Ophthalmology

## 2013-12-30 ENCOUNTER — Ambulatory Visit (HOSPITAL_COMMUNITY)
Admission: RE | Admit: 2013-12-30 | Discharge: 2013-12-30 | Disposition: A | Discharge status: Home / Self Care | Payer: PRIVATE HEALTH INSURANCE | Source: Ambulatory Visit | Attending: Ophthalmology | Admitting: Ophthalmology

## 2013-12-30 DIAGNOSIS — H268 Other specified cataract: Secondary | ICD-10-CM | POA: Diagnosis present

## 2013-12-30 DIAGNOSIS — Z8673 Personal history of transient ischemic attack (TIA), and cerebral infarction without residual deficits: Secondary | ICD-10-CM | POA: Insufficient documentation

## 2013-12-30 DIAGNOSIS — Z87891 Personal history of nicotine dependence: Secondary | ICD-10-CM | POA: Diagnosis not present

## 2013-12-30 DIAGNOSIS — Z79899 Other long term (current) drug therapy: Secondary | ICD-10-CM | POA: Insufficient documentation

## 2013-12-30 DIAGNOSIS — Z7982 Long term (current) use of aspirin: Secondary | ICD-10-CM | POA: Diagnosis not present

## 2013-12-30 DIAGNOSIS — I10 Essential (primary) hypertension: Secondary | ICD-10-CM | POA: Insufficient documentation

## 2013-12-30 HISTORY — PX: CATARACT EXTRACTION W/PHACO: SHX586

## 2013-12-30 SURGERY — PHACOEMULSIFICATION, CATARACT, WITH IOL INSERTION
Anesthesia: Monitor Anesthesia Care | Site: Eye | Laterality: Right

## 2013-12-30 MED ORDER — TRYPAN BLUE 0.06 % OP SOLN
OPHTHALMIC | Status: AC
  Filled 2013-12-30: qty 0.5

## 2013-12-30 MED ORDER — MIDAZOLAM HCL 2 MG/2ML IJ SOLN
1.0000 mg | INTRAMUSCULAR | Status: DC | PRN
  Administered 2013-12-30: 2 mg via INTRAVENOUS

## 2013-12-30 MED ORDER — EPINEPHRINE HCL 1 MG/ML IJ SOLN
INTRAOCULAR | Status: DC | PRN
  Administered 2013-12-30 (×2): 500 mL

## 2013-12-30 MED ORDER — MIDAZOLAM HCL 2 MG/2ML IJ SOLN
INTRAMUSCULAR | Status: AC
  Filled 2013-12-30: qty 2

## 2013-12-30 MED ORDER — NA HYALUR & NA CHOND-NA HYALUR 0.55-0.5 ML IO KIT
PACK | INTRAOCULAR | Status: DC | PRN
  Administered 2013-12-30: 1 via OPHTHALMIC

## 2013-12-30 MED ORDER — TETRACAINE HCL 0.5 % OP SOLN
1.0000 [drp] | OPHTHALMIC | Status: AC
  Administered 2013-12-30 (×3): 1 [drp] via OPHTHALMIC

## 2013-12-30 MED ORDER — EPINEPHRINE HCL 1 MG/ML IJ SOLN
INTRAMUSCULAR | Status: AC
  Filled 2013-12-30: qty 1

## 2013-12-30 MED ORDER — CYCLOPENTOLATE-PHENYLEPHRINE OP SOLN OPTIME - NO CHARGE
OPHTHALMIC | Status: AC
  Filled 2013-12-30: qty 2

## 2013-12-30 MED ORDER — LIDOCAINE HCL 3.5 % OP GEL
OPHTHALMIC | Status: DC | PRN
  Administered 2013-12-30: 1 via OPHTHALMIC

## 2013-12-30 MED ORDER — TETRACAINE 0.5 % OP SOLN OPTIME - NO CHARGE
OPHTHALMIC | Status: DC | PRN
Start: 2013-12-30 — End: 2013-12-30
  Administered 2013-12-30: 1 [drp] via OPHTHALMIC

## 2013-12-30 MED ORDER — FENTANYL CITRATE 0.05 MG/ML IJ SOLN
25.0000 ug | INTRAMUSCULAR | Status: AC
  Administered 2013-12-30 (×2): 25 ug via INTRAVENOUS

## 2013-12-30 MED ORDER — FENTANYL CITRATE 0.05 MG/ML IJ SOLN
INTRAMUSCULAR | Status: AC
  Filled 2013-12-30: qty 2

## 2013-12-30 MED ORDER — BSS IO SOLN
INTRAOCULAR | Status: DC | PRN
  Administered 2013-12-30: 15 mL via INTRAOCULAR

## 2013-12-30 MED ORDER — POVIDONE-IODINE 5 % OP SOLN
OPHTHALMIC | Status: DC | PRN
  Administered 2013-12-30: 1 via OPHTHALMIC

## 2013-12-30 MED ORDER — TETRACAINE HCL 0.5 % OP SOLN
OPHTHALMIC | Status: AC
  Filled 2013-12-30: qty 2

## 2013-12-30 MED ORDER — TRYPAN BLUE 0.06 % OP SOLN
OPHTHALMIC | Status: DC | PRN
  Administered 2013-12-30: 0.5 mL via INTRAOCULAR

## 2013-12-30 MED ORDER — CYCLOPENTOLATE-PHENYLEPHRINE 0.2-1 % OP SOLN
1.0000 [drp] | OPHTHALMIC | Status: AC
  Administered 2013-12-30 (×3): 1 [drp] via OPHTHALMIC

## 2013-12-30 MED ORDER — LIDOCAINE HCL 3.5 % OP GEL
OPHTHALMIC | Status: AC
  Filled 2013-12-30: qty 1

## 2013-12-30 MED ORDER — LACTATED RINGERS IV SOLN
INTRAVENOUS | Status: DC
  Administered 2013-12-30: 1000 mL via INTRAVENOUS

## 2013-12-30 SURGICAL SUPPLY — 28 items
CAPSULAR TENSION RING-AMO (OPHTHALMIC RELATED) IMPLANT
CLOTH BEACON ORANGE TIMEOUT ST (SAFETY) ×2 IMPLANT
GLOVE BIO SURGEON STRL SZ7.5 (GLOVE) IMPLANT
GLOVE BIOGEL M 6.5 STRL (GLOVE) IMPLANT
GLOVE BIOGEL PI IND STRL 6.5 (GLOVE) ×1 IMPLANT
GLOVE BIOGEL PI IND STRL 7.0 (GLOVE) ×1 IMPLANT
GLOVE BIOGEL PI INDICATOR 6.5 (GLOVE) ×1
GLOVE BIOGEL PI INDICATOR 7.0 (GLOVE) ×1
GLOVE ECLIPSE 6.5 STRL STRAW (GLOVE) IMPLANT
GLOVE ECLIPSE 7.5 STRL STRAW (GLOVE) IMPLANT
GLOVE EXAM NITRILE LRG STRL (GLOVE) IMPLANT
GLOVE EXAM NITRILE MD LF STRL (GLOVE) IMPLANT
GLOVE SKINSENSE NS SZ6.5 (GLOVE)
GLOVE SKINSENSE NS SZ7.0 (GLOVE)
GLOVE SKINSENSE STRL SZ6.5 (GLOVE) IMPLANT
GLOVE SKINSENSE STRL SZ7.0 (GLOVE) IMPLANT
INST SET CATARACT ~~LOC~~ (KITS) ×2 IMPLANT
KIT VITRECTOMY (OPHTHALMIC RELATED) IMPLANT
PAD ARMBOARD 7.5X6 YLW CONV (MISCELLANEOUS) ×2 IMPLANT
PROC W NO LENS (INTRAOCULAR LENS)
PROC W SPEC LENS (INTRAOCULAR LENS)
PROCESS W NO LENS (INTRAOCULAR LENS) IMPLANT
PROCESS W SPEC LENS (INTRAOCULAR LENS) IMPLANT
RETRACTOR IRIS SIGHTPATH (OPHTHALMIC RELATED) IMPLANT
RING MALYGIN (MISCELLANEOUS) IMPLANT
SIGHTPATH CAT PROC W REG LENS (Ophthalmic Related) ×2 IMPLANT
VISCOELASTIC ADDITIONAL (OPHTHALMIC RELATED) IMPLANT
WATER STERILE IRR 250ML POUR (IV SOLUTION) ×2 IMPLANT

## 2013-12-30 NOTE — Anesthesia Postprocedure Evaluation (Signed)
  Anesthesia Post-op Note  Patient: Billy Carson  Procedure(s) Performed: Procedure(s): CATARACT EXTRACTION PHACO AND INTRAOCULAR LENS PLACEMENT; CDE:  110.53 (Right)  Patient Location: Short Stay  Anesthesia Type:MAC  Level of Consciousness: awake, alert , oriented and patient cooperative  Airway and Oxygen Therapy: Patient Spontanous Breathing  Post-op Pain: none  Post-op Assessment: Post-op Vital signs reviewed, Patient's Cardiovascular Status Stable and Respiratory Function Stable  Post-op Vital Signs: Reviewed and stable  Last Vitals:  Filed Vitals:   12/30/13 0815  BP: 109/61  Pulse:   Temp:   Resp: 61    Complications: No apparent anesthesia complications

## 2013-12-30 NOTE — Transfer of Care (Signed)
Immediate Anesthesia Transfer of Care Note  Patient: Billy Carson  Procedure(s) Performed: Procedure(s): CATARACT EXTRACTION PHACO AND INTRAOCULAR LENS PLACEMENT; CDE:  110.53 (Right)  Patient Location: Short Stay  Anesthesia Type:MAC  Level of Consciousness: awake, alert , oriented and patient cooperative  Airway & Oxygen Therapy: Patient Spontanous Breathing  Post-op Assessment: Report given to PACU RN, Post -op Vital signs reviewed and stable and Patient moving all extremities  Post vital signs: Reviewed and stable  Complications: No apparent anesthesia complications

## 2013-12-30 NOTE — Op Note (Signed)
See scanned op note 

## 2013-12-30 NOTE — Anesthesia Preprocedure Evaluation (Signed)
Anesthesia Evaluation  Patient identified by MRN, date of birth, ID band Patient awake    Reviewed: Allergy & Precautions, H&P , NPO status , Patient's Chart, lab work & pertinent test results  Airway Mallampati: II  TM Distance: >3 FB     Dental  (+) Edentulous Upper, Edentulous Lower   Pulmonary former smoker,  breath sounds clear to auscultation        Cardiovascular hypertension, Pt. on medications Rhythm:Regular Rate:Normal     Neuro/Psych CVA, Residual Symptoms    GI/Hepatic (+)     substance abuse  alcohol use,   Endo/Other    Renal/GU      Musculoskeletal   Abdominal   Peds  Hematology   Anesthesia Other Findings   Reproductive/Obstetrics                             Anesthesia Physical Anesthesia Plan  ASA: III  Anesthesia Plan: MAC   Post-op Pain Management:    Induction: Intravenous  Airway Management Planned: Nasal Cannula  Additional Equipment:   Intra-op Plan:   Post-operative Plan:   Informed Consent: I have reviewed the patients History and Physical, chart, labs and discussed the procedure including the risks, benefits and alternatives for the proposed anesthesia with the patient or authorized representative who has indicated his/her understanding and acceptance.     Plan Discussed with:   Anesthesia Plan Comments:         Anesthesia Quick Evaluation

## 2013-12-30 NOTE — H&P (Signed)
I have reviewed the pre printed H&P, the patient was re-examined, and I have identified no significant interval changes in the patient's medical condition.  There is no change in the plan of care since the history and physical of record. 

## 2013-12-30 NOTE — Discharge Instructions (Signed)
Billy Carson 12/30/2013 Dr. Iona Hansen Post operative Instructions for Cataract Patients  These instructions are for Billy Carson and pertain to the operative eye.  1.  Resume your normal diet and previous oral medicines.  2. Your Follow-up appointment is at Dr. Iona Hansen' office in [place] on 12/31/2013 at 2:45 pm.  3. You may leave the hospital when your driver is present and your nurse releases you.  4. Begin Pred Forte (prednisolone acetate 1%), Acular LS (ketorolac tromethamine .4%) and Gatifloxacin 0.5% eye drops; 1 drop each 4 times daily to operative eye. Begin 3 hours after discharge from Short Stay Unit.  Moxifloxacin 0.5% may be substituted for Gatifloxacin using the same instructions.  101. Page Dr. Iona Hansen via beeper [phone] for significant pain in or around operative eye that is not relieved by Tylenol.  6. If you took Plavix before surgery, restart it at the usual dose on the evening of surgery.  7. Wear dark glasses as necessary for excessive light sensitivity.  8. Do no forcefully rub you your operative eye.  9. Keep your operative eye dry for 1 week. You may gently clean your eyelids with a damp washcloth.  10. You may resume normal occupational activities in one week and resume driving as tolerated after the first post operative visit.  11. It is normal to have blurred vision and a scratchy sensation following surgery.  Dr. Iona Hansen: [phone]

## 2013-12-30 NOTE — Brief Op Note (Signed)
12/30/2013  9:46 AM  PATIENT:  Billy Carson  68 y.o. male  PRE-OPERATIVE DIAGNOSIS:  nuclear cataract right eye  POST-OPERATIVE DIAGNOSIS:  nuclear cataract right eye  PROCEDURE:  Procedure(s): CATARACT EXTRACTION PHACO AND INTRAOCULAR LENS PLACEMENT; CDE:  110.53  SURGEON:  Surgeon(s): Williams Che, MD  ASSISTANTS: Bonney Roussel, CST   ANESTHESIA STAFF: Anesthesiologist: Lerry Liner, MD CRNA: Charmaine Downs, CRNA  ANESTHESIA:   topical and MAC  REQUESTED LENS POWER: 20.5  LENS IMPLANT INFORMATION:  [employer] [id number] s/n [id number]   Exp 07/2018  CUMULATIVE DISSIPATED ENERGY:110.53  INDICATIONS:see office H&P  OP FINDINGS:total white cataract, multiple synechiae  COMPLICATIONS:None  DICTATION #: none  PLAN OF CARE: as above  PATIENT DISPOSITION:  Short Stay

## 2013-12-31 ENCOUNTER — Encounter (HOSPITAL_COMMUNITY): Payer: Self-pay | Admitting: Ophthalmology

## 2014-12-10 ENCOUNTER — Telehealth: Payer: Self-pay

## 2014-12-10 NOTE — Telephone Encounter (Signed)
T/C from [place] at [facility] pt needs appt for screening colonoscopy. She had the med list faxed over and pt has hx of alcohol abuse. OV scheduled with Walden Field, NP on 12/24/2014 at 10:00 AM. I spoke with nurse this Am, Tandy Gaw, who said that pt is able to sign for himself. Cloyde Reams will check and see if his daughter will want to come with him . Pt's wife, Hoyle Sauer,  also has appt today with Randall Hiss for screening colonoscopy.

## 2014-12-24 ENCOUNTER — Other Ambulatory Visit: Payer: Self-pay

## 2014-12-24 ENCOUNTER — Encounter: Payer: Self-pay | Admitting: Nurse Practitioner

## 2014-12-24 ENCOUNTER — Ambulatory Visit (INDEPENDENT_AMBULATORY_CARE_PROVIDER_SITE_OTHER): Payer: Medicare Other | Admitting: Nurse Practitioner

## 2014-12-24 VITALS — BP 128/72 | HR 70 | Temp 97.6°F | Ht 72.0 in | Wt 247.0 lb

## 2014-12-24 DIAGNOSIS — F1010 Alcohol abuse, uncomplicated: Secondary | ICD-10-CM | POA: Diagnosis not present

## 2014-12-24 DIAGNOSIS — Z1211 Encounter for screening for malignant neoplasm of colon: Secondary | ICD-10-CM | POA: Insufficient documentation

## 2014-12-24 MED ORDER — PEG 3350-KCL-NA BICARB-NACL 420 G PO SOLR: 4000.0000 mL | Freq: Once | ORAL | Status: DC

## 2014-12-24 MED ORDER — BISACODYL 5 MG PO TBEC: 5.0000 mg | DELAYED_RELEASE_TABLET | Freq: Every day | ORAL | Status: DC | PRN

## 2014-12-24 MED ORDER — PHOSPHATE LAXATIVE 2.7-7.2 GM/15ML PO SOLN
15.0000 mL | Freq: Once | ORAL | Status: DC
Start: 2014-12-24 — End: 2015-02-10

## 2014-12-24 NOTE — Assessment & Plan Note (Signed)
Patient with a history of alcohol abuse. Has not had any alcohol since being admitted to the facility one year ago. Previous to that he drank approximately 1 pint of liquor a day for the previous 10 years. For this reason we'll provide for propofol/MAC for his procedure as noted above.

## 2014-12-24 NOTE — Progress Notes (Signed)
cc'ed to pcp °

## 2014-12-24 NOTE — Patient Instructions (Signed)
1. We will schedule your procedure for you. 2. Further recommendations to be based on results of your procedure. 

## 2014-12-24 NOTE — Assessment & Plan Note (Signed)
Patient presents for first-ever colonoscopy. Notes occasional hematochezia with bowel movements, no history of hemorrhoids. Otherwise he is asymptomatic from a GI standpoint. We'll move forward with the planned first-ever colonoscopy on propofol/MAC.  The patient is not on any anticoagulants, antidepressants, anxiolytics, her chronic pain medications. Extensive history of alcohol abuse as noted below, no alcohol in the previous year. For this reason we'll plan for the procedure and the OR on propofol/MAC to promote adequate sedation.

## 2014-12-24 NOTE — Progress Notes (Signed)
Primary Care Physician:  Jani Gravel, MD Primary Gastroenterologist:  Dr. Oneida Alar  Chief Complaint  Patient presents with  . Colonoscopy    HPI:   69 year old male presents on referral from primary care for colonoscopy. He is brought into the office rather than from triage due to history of alcohol abuse. He is accompanied by his wife who is also here for colonoscopy evaluation. He currently resides at [facility] in [place]. A colonoscopy records found in our system.  Today he states he has never had a colonoscopy before. Deneis abdominal pain, N/V, melena. Has Is having hematochezia on the stool and in the toilet. Denies history of hemorrhoids. No change in bowel habits. Denies fever, chills, unintentional weight loss. Denies chest pain, dyspnea, dizziness, lightheadedness, syncope, near syncope. Denies any other upper or lower GI symptoms.  Past Medical History  Diagnosis Date  . Hypertension   . Stroke [facility])     left sided weakness  . Hyperlipidemia     Past Surgical History  Procedure Laterality Date  . Tonsillectomy    . Cataract extraction w/phaco Left 10/14/2013    Procedure: CATARACT EXTRACTION PHACO AND INTRAOCULAR LENS PLACEMENT LEFT EYE CDE=11.04;  Surgeon: Williams Che, MD;  Location: [facility];  Service: Ophthalmology;  Laterality: Left;  . Cataract extraction w/phaco Right 12/30/2013    Procedure: CATARACT EXTRACTION PHACO AND INTRAOCULAR LENS PLACEMENT; CDE:  110.53;  Surgeon: Williams Che, MD;  Location: [facility];  Service: Ophthalmology;  Laterality: Right;    Current Outpatient Prescriptions  Medication Sig Dispense Refill  . aspirin 81 MG tablet Take 81 mg by mouth daily.    Marland Kitchen atorvastatin (LIPITOR) 10 MG tablet     . cholecalciferol (VITAMIN D) 1000 UNITS tablet Take 1,000 Units by mouth daily.    . cyanocobalamin (,VITAMIN B-12,) 1000 MCG/ML injection Inject 1,000 mcg into the muscle every 30 (thirty) days.    . folic acid (FOLVITE) Q000111Q MCG tablet Take  400 mcg by mouth daily.    . hydrALAZINE (APRESOLINE) 10 MG tablet Take 1 tablet (10 mg total) by mouth every 8 (eight) hours. 90 tablet 1  . lisinopril (PRINIVIL,ZESTRIL) 20 MG tablet Take 20 mg by mouth daily.    . metoprolol tartrate (LOPRESSOR) 25 MG tablet Take 12.5 mg by mouth 2 (two) times daily.    . Multiple Vitamin (MULTIVITAMIN WITH MINERALS) TABS tablet Take 1 tablet by mouth daily. 30 tablet 1  . senna-docusate (SENOKOT-S) 8.6-50 MG per tablet Take 1 tablet by mouth daily.    . rosuvastatin (CRESTOR) 5 MG tablet Take 5 mg by mouth daily. Reported on 12/24/2014     No current facility-administered medications for this visit.    Allergies as of 12/24/2014  . (No Known Allergies)    No family history on file.  Social History   Social History  . Marital Status: Married    Spouse Name: N/A  . Number of Children: N/A  . Years of Education: N/A   Occupational History  . Not on file.   Social History Main Topics  . Smoking status: Former Smoker -- 1.00 packs/day for 20 years    Types: Cigarettes    Quit date: 10/03/2012  . Smokeless tobacco: Not on file  . Alcohol Use: Yes     Comment: 1 pint per day per pt 12/24/2013  . Drug Use: No  . Sexual Activity: No   Other Topics Concern  . Not on file   Social History Narrative  Review of Systems: 10-point ROS negative except as per HPI.    Physical Exam: BP 128/72 mmHg  Pulse 70  Temp(Src) 97.6 F (36.4 C) (Oral)  Ht 6' (1.829 m)  Wt 247 lb (112.038 kg)  BMI 33.49 kg/m2 General:   Alert and oriented. Pleasant and cooperative. Well-nourished and well-developed. Sitting in a wheelchair. Head:  Normocephalic and atraumatic. Eyes:  Without icterus, sclera clear and conjunctiva pink.  Ears:  Normal auditory acuity. Cardiovascular:  S1, S2 present without murmurs appreciated. Extremities without clubbing or edema. Respiratory:  Clear to auscultation bilaterally. No wheezes, rales, or rhonchi. No distress.    Gastrointestinal:  +BS, soft, non-tender and non-distended. No HSM noted. No guarding or rebound. No masses appreciated.  Rectal:  Deferred  Neurologic:  Alert and oriented x4;  grossly normal neurologically. Psych:  Alert and cooperative. Normal mood and affect.    12/24/2014 10:03 AM

## 2015-01-15 NOTE — Patient Instructions (Signed)
Billy Carson  01/15/2015     @PREFPERIOPPHARMACY @   Your procedure is scheduled on 01/20/2015.  Report to [facility] at 12:30 P.M.  Call this number if you have problems the morning of surgery:  [phone]   Remember:  Do not eat food or drink liquids after midnight.  Take these medicines the morning of surgery with A SIP OF WATER Amlodipine, lisinopril, metoprolol   Do not wear jewelry, make-up or nail polish.  Do not wear lotions, powders, or perfumes.  You may wear deodorant.  Do not shave 48 hours prior to surgery.  Men may shave face and neck.  Do not bring valuables to the hospital.  [facility] is not responsible for any belongings or valuables.  Contacts, dentures or bridgework may not be worn into surgery.  Leave your suitcase in the car.  After surgery it may be brought to your room.  For patients admitted to the hospital, discharge time will be determined by your treatment team.  Patients discharged the day of surgery will not be allowed to drive home.    Please read over the following fact sheets that you were given. Anesthesia Post-op Instructions     PATIENT INSTRUCTIONS POST-ANESTHESIA  IMMEDIATELY FOLLOWING SURGERY:  Do not drive or operate machinery for the first twenty four hours after surgery.  Do not make any important decisions for twenty four hours after surgery or while taking narcotic pain medications or sedatives.  If you develop intractable nausea and vomiting or a severe headache please notify your doctor immediately.  FOLLOW-UP:  Please make an appointment with your surgeon as instructed. You do not need to follow up with anesthesia unless specifically instructed to do so.  WOUND CARE INSTRUCTIONS (if applicable):  Keep a dry clean dressing on the anesthesia/puncture wound site if there is drainage.  Once the wound has quit draining you may leave it open to air.  Generally you should leave the bandage intact for twenty four hours unless  there is drainage.  If the epidural site drains for more than 36-48 hours please call the anesthesia department.  QUESTIONS?:  Please feel free to call your physician or the hospital operator if you have any questions, and they will be happy to assist you.      Colonoscopy A colonoscopy is an exam to look at the entire large intestine (colon). This exam can help find problems such as tumors, polyps, inflammation, and areas of bleeding. The exam takes about 1 hour.  LET [facility] CARE PROVIDER KNOW ABOUT:   Any allergies you have.  All medicines you are taking, including vitamins, herbs, eye drops, creams, and over-the-counter medicines.  Previous problems you or members of your family have had with the use of anesthetics.  Any blood disorders you have.  Previous surgeries you have had.  Medical conditions you have. RISKS AND COMPLICATIONS  Generally, this is a safe procedure. However, as with any procedure, complications can occur. Possible complications include:  Bleeding.  Tearing or rupture of the colon wall.  Reaction to medicines given during the exam.  Infection (rare). BEFORE THE PROCEDURE   Ask your health care provider about changing or stopping your regular medicines.  You may be prescribed an oral bowel prep. This involves drinking a large amount of medicated liquid, starting the day before your procedure. The liquid will cause you to have multiple loose stools until your stool is almost clear or light green. This cleans out your colon in  preparation for the procedure.  Do not eat or drink anything else once you have started the bowel prep, unless your health care provider tells you it is safe to do so.  Arrange for someone to drive you home after the procedure. PROCEDURE   You will be given medicine to help you relax (sedative).  You will lie on your side with your knees bent.  A long, flexible tube with a light and camera on the end (colonoscope) will be  inserted through the rectum and into the colon. The camera sends video back to a computer screen as it moves through the colon. The colonoscope also releases carbon dioxide gas to inflate the colon. This helps your health care provider see the area better.  During the exam, your health care provider may take a small tissue sample (biopsy) to be examined under a microscope if any abnormalities are found.  The exam is finished when the entire colon has been viewed. AFTER THE PROCEDURE   Do not drive for 24 hours after the exam.  You may have a small amount of blood in your stool.  You may pass moderate amounts of gas and have mild abdominal cramping or bloating. This is caused by the gas used to inflate your colon during the exam.  Ask when your test results will be ready and how you will get your results. Make sure you get your test results.   This information is not intended to replace advice given to you by your health care provider. Make sure you discuss any questions you have with your health care provider.   Document Released: 12/25/1999 Document Revised: 10/17/2012 Document Reviewed: 09/03/2012 Elsevier Interactive Patient Education [employer].

## 2015-01-16 ENCOUNTER — Other Ambulatory Visit: Payer: Self-pay

## 2015-01-16 ENCOUNTER — Encounter (HOSPITAL_COMMUNITY): Payer: Self-pay

## 2015-01-16 ENCOUNTER — Encounter (HOSPITAL_COMMUNITY)
Admission: RE | Admit: 2015-01-16 | Discharge: 2015-01-16 | Disposition: A | Discharge status: Home / Self Care | Payer: Medicare Other | Source: Ambulatory Visit | Attending: Gastroenterology | Admitting: Gastroenterology

## 2015-01-16 DIAGNOSIS — Z01812 Encounter for preprocedural laboratory examination: Secondary | ICD-10-CM | POA: Insufficient documentation

## 2015-01-16 DIAGNOSIS — I440 Atrioventricular block, first degree: Secondary | ICD-10-CM | POA: Diagnosis not present

## 2015-01-16 DIAGNOSIS — Z01818 Encounter for other preprocedural examination: Secondary | ICD-10-CM | POA: Insufficient documentation

## 2015-01-16 DIAGNOSIS — R9431 Abnormal electrocardiogram [ECG] [EKG]: Secondary | ICD-10-CM | POA: Insufficient documentation

## 2015-01-16 LAB — CBC
HCT: 38.8 % — ABNORMAL LOW (ref 39.0–52.0)
HEMOGLOBIN: 12.9 g/dL — AB (ref 13.0–17.0)
MCH: 31.6 pg (ref 26.0–34.0)
MCHC: 33.2 g/dL (ref 30.0–36.0)
MCV: 95.1 fL (ref 78.0–100.0)
Platelets: 218 10*3/uL (ref 150–400)
RBC: 4.08 MIL/uL — ABNORMAL LOW (ref 4.22–5.81)
RDW: 12.9 % (ref 11.5–15.5)
WBC: 7.9 10*3/uL (ref 4.0–10.5)

## 2015-01-16 LAB — BASIC METABOLIC PANEL
Anion gap: 8 (ref 5–15)
BUN: 18 mg/dL (ref 6–20)
CALCIUM: 9.5 mg/dL (ref 8.9–10.3)
CHLORIDE: 105 mmol/L (ref 101–111)
CO2: 26 mmol/L (ref 22–32)
CREATININE: 1.36 mg/dL — AB (ref 0.61–1.24)
GFR, EST AFRICAN AMERICAN: 59 mL/min — AB (ref >60)
GFR, EST NON AFRICAN AMERICAN: 51 mL/min — AB (ref >60)
Glucose, Bld: 109 mg/dL — ABNORMAL HIGH (ref 65–99)
Potassium: 4.3 mmol/L (ref 3.5–5.1)
Sodium: 139 mmol/L (ref 135–145)

## 2015-01-16 NOTE — Pre-Procedure Instructions (Signed)
Patient and wife from [place], accompanied by Edwinna Areola CNA. Patients daughter Katha Hamming called and will be here on 01/19/2014 to sign consent due to dementia of both parents.

## 2015-01-20 ENCOUNTER — Telehealth: Payer: Self-pay

## 2015-01-20 ENCOUNTER — Other Ambulatory Visit: Payer: Self-pay

## 2015-01-20 NOTE — Progress Notes (Signed)
Spoke with Larene Beach at [facility] to decrease his lisinopril to 10 mg daily. She is also aware that Dr.Kim need to follow up on him about his kidney function.

## 2015-01-20 NOTE — Telephone Encounter (Signed)
Instructions faxed and RX faxed

## 2015-01-20 NOTE — Telephone Encounter (Signed)
Spoke with nurse(Cindy Pruitt) at [facility] about his not having his Prep for her TCS today. She stated that it was on them and she looking into it and will call us back.

## 2015-01-20 NOTE — Telephone Encounter (Signed)
No changes to history noted.  Pt has been rescheduled for procedure on 02/10/2015 @ 1030am. Faxing new instructions and prep RX

## 2015-01-20 NOTE — Telephone Encounter (Signed)
Late entry: Spoke with nurse at [facility] yesterday about there his time being moved up since others had cancled. I when over the instructions with her and she understood.

## 2015-01-20 NOTE — Progress Notes (Addendum)
PLEASE CALL PT. HIS KIDNEY FUNCTION IS A LITTLE WORSE THAN IT WAS ONE YEAR AGO. HE SHOULD SEE DR. Maudie Mercury TO DISCUSS. IN THE MEANTIME HE SHOULD REDUCE HIS LISINOPRIL TO 10 MG DAILY. HE SHOULD BE SCHEDULED FOR A TCS/POSSIBLE EGD DUE TO ANEMIA.

## 2015-02-04 ENCOUNTER — Other Ambulatory Visit (HOSPITAL_COMMUNITY): Payer: Medicare Other

## 2015-02-05 ENCOUNTER — Other Ambulatory Visit (HOSPITAL_COMMUNITY): Payer: Medicare Other

## 2015-02-05 ENCOUNTER — Encounter (HOSPITAL_COMMUNITY): Payer: Self-pay

## 2015-02-05 ENCOUNTER — Encounter (HOSPITAL_COMMUNITY)
Admission: RE | Admit: 2015-02-05 | Discharge: 2015-02-05 | Disposition: A | Discharge status: Home / Self Care | Payer: Medicare Other | Source: Ambulatory Visit | Attending: Gastroenterology | Admitting: Gastroenterology

## 2015-02-10 ENCOUNTER — Encounter (HOSPITAL_COMMUNITY)
Admission: RE | Disposition: A | Discharge status: Nursing Home (Includes ICF) | Payer: Self-pay | Source: Ambulatory Visit | Attending: Gastroenterology

## 2015-02-10 ENCOUNTER — Ambulatory Visit (HOSPITAL_COMMUNITY): Payer: Medicare Other | Admitting: Anesthesiology

## 2015-02-10 ENCOUNTER — Encounter (HOSPITAL_COMMUNITY): Payer: Self-pay

## 2015-02-10 ENCOUNTER — Ambulatory Visit (HOSPITAL_COMMUNITY)
Admission: RE | Admit: 2015-02-10 | Discharge: 2015-02-10 | Disposition: A | Discharge status: Nursing Home (Includes ICF) | Payer: Medicare Other | Source: Ambulatory Visit | Attending: Gastroenterology | Admitting: Gastroenterology

## 2015-02-10 DIAGNOSIS — Z79899 Other long term (current) drug therapy: Secondary | ICD-10-CM | POA: Insufficient documentation

## 2015-02-10 DIAGNOSIS — I10 Essential (primary) hypertension: Secondary | ICD-10-CM | POA: Insufficient documentation

## 2015-02-10 DIAGNOSIS — K2980 Duodenitis without bleeding: Secondary | ICD-10-CM | POA: Diagnosis not present

## 2015-02-10 DIAGNOSIS — Z7982 Long term (current) use of aspirin: Secondary | ICD-10-CM | POA: Insufficient documentation

## 2015-02-10 DIAGNOSIS — C159 Malignant neoplasm of esophagus, unspecified: Secondary | ICD-10-CM | POA: Diagnosis not present

## 2015-02-10 DIAGNOSIS — K2970 Gastritis, unspecified, without bleeding: Secondary | ICD-10-CM | POA: Diagnosis not present

## 2015-02-10 DIAGNOSIS — D649 Anemia, unspecified: Secondary | ICD-10-CM | POA: Diagnosis not present

## 2015-02-10 DIAGNOSIS — D122 Benign neoplasm of ascending colon: Secondary | ICD-10-CM | POA: Insufficient documentation

## 2015-02-10 DIAGNOSIS — D120 Benign neoplasm of cecum: Secondary | ICD-10-CM

## 2015-02-10 DIAGNOSIS — E785 Hyperlipidemia, unspecified: Secondary | ICD-10-CM | POA: Diagnosis not present

## 2015-02-10 DIAGNOSIS — K222 Esophageal obstruction: Secondary | ICD-10-CM | POA: Insufficient documentation

## 2015-02-10 DIAGNOSIS — K229 Disease of esophagus, unspecified: Secondary | ICD-10-CM

## 2015-02-10 DIAGNOSIS — D125 Benign neoplasm of sigmoid colon: Secondary | ICD-10-CM | POA: Insufficient documentation

## 2015-02-10 DIAGNOSIS — K648 Other hemorrhoids: Secondary | ICD-10-CM | POA: Insufficient documentation

## 2015-02-10 DIAGNOSIS — Z87891 Personal history of nicotine dependence: Secondary | ICD-10-CM | POA: Insufficient documentation

## 2015-02-10 DIAGNOSIS — K2950 Unspecified chronic gastritis without bleeding: Secondary | ICD-10-CM | POA: Diagnosis not present

## 2015-02-10 DIAGNOSIS — K2990 Gastroduodenitis, unspecified, without bleeding: Secondary | ICD-10-CM

## 2015-02-10 HISTORY — PX: BIOPSY: SHX5522

## 2015-02-10 HISTORY — PX: ESOPHAGOGASTRODUODENOSCOPY (EGD) WITH PROPOFOL: SHX5813

## 2015-02-10 HISTORY — PX: POLYPECTOMY: SHX5525

## 2015-02-10 HISTORY — PX: COLONOSCOPY WITH PROPOFOL: SHX5780

## 2015-02-10 SURGERY — ESOPHAGOGASTRODUODENOSCOPY (EGD) WITH PROPOFOL
Anesthesia: Monitor Anesthesia Care

## 2015-02-10 SURGERY — COLONOSCOPY WITH PROPOFOL
Anesthesia: Monitor Anesthesia Care

## 2015-02-10 MED ORDER — GLYCOPYRROLATE 0.2 MG/ML IJ SOLN
INTRAMUSCULAR | Status: AC
  Filled 2015-02-10: qty 1

## 2015-02-10 MED ORDER — FENTANYL CITRATE (PF) 100 MCG/2ML IJ SOLN: 25.0000 ug | INTRAMUSCULAR | Status: DC | PRN

## 2015-02-10 MED ORDER — ONDANSETRON HCL 4 MG/2ML IJ SOLN: 4.0000 mg | Freq: Once | INTRAMUSCULAR | Status: DC | PRN

## 2015-02-10 MED ORDER — LACTATED RINGERS IV SOLN
INTRAVENOUS | Status: DC | PRN
  Administered 2015-02-10: 10:00:00 via INTRAVENOUS

## 2015-02-10 MED ORDER — MIDAZOLAM HCL 2 MG/2ML IJ SOLN
1.0000 mg | INTRAMUSCULAR | Status: DC | PRN
  Administered 2015-02-10: 2 mg via INTRAVENOUS

## 2015-02-10 MED ORDER — PROPOFOL 500 MG/50ML IV EMUL
INTRAVENOUS | Status: DC | PRN
  Administered 2015-02-10: 75 ug/kg/min via INTRAVENOUS
  Administered 2015-02-10 (×2): via INTRAVENOUS

## 2015-02-10 MED ORDER — LACTATED RINGERS IV SOLN
INTRAVENOUS | Status: DC
Start: 2015-02-10 — End: 2015-02-10
  Administered 2015-02-10: 10:00:00 via INTRAVENOUS

## 2015-02-10 MED ORDER — PROPOFOL 10 MG/ML IV BOLUS
INTRAVENOUS | Status: AC
  Filled 2015-02-10: qty 20

## 2015-02-10 MED ORDER — SODIUM CHLORIDE 0.9% FLUSH
INTRAVENOUS | Status: AC
  Filled 2015-02-10: qty 20

## 2015-02-10 MED ORDER — MIDAZOLAM HCL 2 MG/2ML IJ SOLN
INTRAMUSCULAR | Status: AC
  Filled 2015-02-10: qty 2

## 2015-02-10 MED ORDER — PROPOFOL 10 MG/ML IV BOLUS
INTRAVENOUS | Status: DC | PRN
  Administered 2015-02-10: 10 mg via INTRAVENOUS

## 2015-02-10 MED ORDER — FENTANYL CITRATE (PF) 100 MCG/2ML IJ SOLN
25.0000 ug | INTRAMUSCULAR | Status: AC
  Administered 2015-02-10: 25 ug via INTRAVENOUS

## 2015-02-10 MED ORDER — EPINEPHRINE HCL 0.1 MG/ML IJ SOSY
PREFILLED_SYRINGE | INTRAMUSCULAR | Status: AC
  Filled 2015-02-10: qty 10

## 2015-02-10 MED ORDER — GLYCOPYRROLATE 0.2 MG/ML IJ SOLN
0.2000 mg | Freq: Once | INTRAMUSCULAR | Status: AC
  Administered 2015-02-10: 0.2 mg via INTRAVENOUS

## 2015-02-10 MED ORDER — FENTANYL CITRATE (PF) 100 MCG/2ML IJ SOLN
INTRAMUSCULAR | Status: AC
  Filled 2015-02-10: qty 2

## 2015-02-10 MED ORDER — LIDOCAINE VISCOUS 2 % MT SOLN
15.0000 mL | Freq: Once | OROMUCOSAL | Status: AC
  Administered 2015-02-10: 15 mL via OROMUCOSAL

## 2015-02-10 NOTE — Discharge Instructions (Signed)
HIS LOW BLOOD COUNT DUE TO COLON POLYPS AND THE ESOPHAGEAL MASS. I PLACED 2 CLIPS IN THE COLON AND THE ESOPHAGUS. HE WAS ACTIVELY OOZING FROM SMALL TEAR THAT WAS CREATED BY THE SCOPE PASSING THROUGH THE MASS. HE had 5 polyps removed. HE HAS internal/EXTERNAL hemorrhoids. HE ALSO HAS have EROSIVE gastritis, & a HIATAL HERNIA. I biopsied your ESOPHAGUS AND stomach.   STOP ASPIRIN.   FOLLOW A SOFT MECHANICAL DIET. AVOID ITEMS THAT CAUSE BLOATING. SEE INFO BELOW.  NO MRI FOR 30 DAYS DUE TO METAL CLIPS PLACED IN THE COLON AND ESOPHAGUS.  AVOID ITEMS THAT TRIGGER GASTRITIS. SEE INFO BELOW.   YOUR BIOPSY RESULTS WILL BE AVAILABLE IN MY CHART FEB 3 AND MY OFFICE WILL CONTACT YOU IN 10-14 DAYS WITH YOUR RESULTS.   Next colonoscopy in 1-3 years. ALL SISTERS, BROTHERS, CHILDREN, AND PARENTS NEED TO HAVE A COLONOSCOPY STARTING AT THE AGE OF 40.    ENDOSCOPY Care After Read the instructions outlined below and refer to this sheet in the next week. These discharge instructions provide you with general information on caring for yourself after you leave the hospital. While your treatment has been planned according to the most current medical practices available, unavoidable complications occasionally occur. If you have any problems or questions after discharge, call DR. Horacio Werth, [phone].  ACTIVITY  You may resume your regular activity, but move at a slower pace for the next 24 hours.   Take frequent rest periods for the next 24 hours.   Walking will help get rid of the air and reduce the bloated feeling in your belly (abdomen).   No driving for 24 hours (because of the medicine (anesthesia) used during the test).   You may shower.   Do not sign any important legal documents or operate any machinery for 24 hours (because of the anesthesia used during the test).    NUTRITION  Drink plenty of fluids.   You may resume your normal diet as instructed by your doctor.   Begin with a light meal  and progress to your normal diet. Heavy or fried foods are harder to digest and may make you feel sick to your stomach (nauseated).   Avoid alcoholic beverages for 24 hours or as instructed.    MEDICATIONS  You may resume your normal medications.   WHAT YOU CAN EXPECT TODAY  Some feelings of bloating in the abdomen.   Passage of more gas than usual.   Spotting of blood in your stool or on the toilet paper  .  IF YOU HAD POLYPS REMOVED DURING THE ENDOSCOPY:  Eat a soft diet IF YOU HAVE NAUSEA, BLOATING, ABDOMINAL PAIN, OR VOMITING.    FINDING OUT THE RESULTS OF YOUR TEST Not all test results are available during your visit. DR. Oneida Alar WILL CALL YOU WITHIN 14 DAYS OF YOUR PROCEDUE WITH YOUR RESULTS. Do not assume everything is normal if you have not heard from DR. Joleene Burnham, CALL HER OFFICE AT [phone].  SEEK IMMEDIATE MEDICAL ATTENTION AND CALL THE OFFICE: [phone] IF:  You have more than a spotting of blood in your stool.   Your belly is swollen (abdominal distention).   You are nauseated or vomiting.   You have a temperature over 101F.   You have abdominal pain or discomfort that is severe or gets worse throughout the day.    Hemorrhoids Hemorrhoids are dilated (enlarged) veins around the rectum. Sometimes clots will form in the veins. This makes them swollen and painful. These are called  thrombosed hemorrhoids. Causes of hemorrhoids include:  Constipation.   Straining to have a bowel movement.   HEAVY LIFTING  HOME CARE INSTRUCTIONS  Eat a well balanced diet and drink 6 to 8 glasses of water every day to avoid constipation. You may also use a bulk laxative.   Avoid straining to have bowel movements.   Keep anal area dry and clean.   Do not use a donut shaped pillow or sit on the toilet for long periods. This increases blood pooling and pain.   Move your bowels when your body has the urge; this will require less straining and will decrease pain and  pressure.   Dysphagia Diet Level 2, Mechanically Altered The dysphagia level 2 diet includes foods that are blended, chopped, ground, or mashed so they are easier to chew and swallow. The foods are soft, moist, and can be chopped into -inch chunks (such as pancakes, pasta, and bananas). In order to be on this diet, you must be able to chew. This diet helps you transition between the pureed textures of the dysphagia level 1 diet to more solid textures. This diet is helpful for people with mild to moderate swallowing difficulties. It reduces the risk of food getting caught in the windpipe, trachea, or lungs.  You may need help or supervision during meals while following this diet so that you eat safely. You will be on this diet until your health care provider advances the texture of your diet.  WHAT DO I NEED TO KNOW ABOUT THIS DIET? Foods  You may eat foods that are soft and moist.  You may need to use a blender, whisk, or masher to soften some of your foods.  You can moisten foods with gravies, sauces, vegetable or fruit juice, milk, half and half, or water when blending, mashing, or grinding your foods to the right consistency.  If you were on the dysphagia level 1 diet, you may still eat any of the foods included in that diet.  Avoid foods that are dry, hard, sticky, chewy, coarse, and crunchy. Also avoid large cuts of food.  Take small bites. Each bite should contain  inch or less of food. Liquids  Avoid liquids with seeds and chunks.  Thicken liquids, if instructed by your health care provider. Your health care provider will tell you the consistency to which you should thicken your liquids for safe swallowing. To thicken a liquid, use a commercial thickener or a thickening food (such as rice cereal or potato flakes). Ask your health care provider for specific recommendations on thickeners. See your dietitian or health care provider regularly for help with your dietary changes. WHAT  FOODS CAN I EAT? Grains Store-bought soft breads that do not have nuts or seeds. Pancakes, sweet rolls, [place] pastries, and [place] toast that have been moistened with syrup or sauce to form a slurry when blended. Well-cooked pasta, noodles, and bread dressing. Well-cooked noodles and pasta in sauce. Moist macaroni and cheese. Soft dumplings or spaetzle with gravy or butter. Cooked cereals (including oatmeal). Low-texture dry cereals, such as rice puff, corn, or wheat-flake cereals, with milk (if thin liquids are not allowed, make sure all of the milk is absorbed by the cereal before eating it). Vegetables Very soft, well-cooked vegetables in pieces less than  inch in size. Cooked potatoes that are moist, not crispy, and with sauce. Fruits Canned or cooked fruits that are soft or moist and do not have skin or seeds. Fresh, soft bananas. Fruit juices with  a small amount of pulp (if thin liquids are allowed). Gelatin or plain gelatin with canned fruit, except pineapple. Meat and Other Protein Sources Tender, moist meats, poultry, or fish cooked with gravy or sauce and cubed to -inch bites or smaller. Ground meat. Moist meatball or meatloaf. Fish without bones. Moist casseroles without rice. Tuna, egg, or meat salad without chunks or hard-to-chew vegetables, such as celery and onions. Smooth quiche without large chunks. Scrambled, poached, or soft-cooked eggs with butter, margarine, sauce, or gravy. Tofu. Well-cooked, moistened and mashed beans, peas, baked beans, and other legumes. Casseroles without rice (such as tuna noodle casserole or soft moist meat lasagna). Dairy Cream cheese. Yogurt. Cottage cheese. Ask your health care provider if milk is allowed. Sweets/Desserts Pudding. Custard. Soft fruit pies with crust on the bottom only. Crisps and cobblers without seeds or nuts and with soft crusts. Soft, moist cakes. Icing. Pre-gelled cookies. Soft, moist cookies dunked in milk, coffee, or another  liquid. Jelly. Soft, smooth chocolate bars that are easily chewed. Jams and preserves without seeds. Ask your health care provider whether you can have frozen desserts. Fats and Oils Butter. Margarine. Cream for cereal, depending on liquid consistency allowed. Gravy. Cream sauces. Mayonnaise. Salad dressings. Cream cheese. Cheese spreads, plain or with soft fruits or vegetables added. Sour cream. Sour cream dips with soft fruits or vegetables added. Whipped toppings. Other Sauces and salsas that have soft chunks that are about  inch or smaller. The items listed above may not be a complete list of recommended foods or beverages. Contact your dietitian for more options. WHAT FOODS ARE NOT RECOMMENDED? Grains All breads not listed in the recommended list. Breads that are hard or have nuts or seeds. Coarse cereals. Cereals that have nuts, seeds, dried fruits, or coconut. Rice. Corn. Vegetables Whole, raw, frozen, or dried vegetables. Tough, fibrous, chewy, or stringy cooked vegetables, such as celery, peas, broccoli, cabbage, Brussels sprouts, and asparagus. Potato skins. Potato and other vegetable chips. Fried or French-fried potatoes. Cooked corn and peas. Fruits Whole raw, frozen, or dried fruits, including coconut. Pineapple. Fruits with seeds. Meat and Other Protein Sources Dry, tough meats, such as bacon, sausage, and hot dogs. Cheese slices and cubes. Peanut butter. Hard boiled or fried eggs. Nuts. Seeds. Pizza. Sandwiches. Dry casseroles or casseroles with rice or large chunks. Dairy Yogurt with nuts, seeds, or large chunks. Sweets/Desserts Coarse, hard, chewy, or sticky desserts. Any dessert with nuts, seeds, coconut, pineapple, or dried fruit. Ask your health care provider whether you can have frozen desserts. Fats and Oils Avoid fats with chunky, large textures, such as those with nuts or fruits. Other Soups and casseroles with large chunks. The items listed above may not be a complete  list of foods and beverages to avoid. Contact your dietitian for more information.   This information is not intended to replace advice given to you by your health care provider. Make sure you discuss any questions you have with your health care provider.   Document Released: 12/27/2004 Document Revised: 01/17/2014 Document Reviewed: 12/10/2012 Elsevier Interactive Patient Education 2016 Elsevier Inc.  Gastritis, Adult Gastritis is soreness and swelling (inflammation) of the lining of the stomach. Gastritis can develop as a sudden onset (acute) or long-term (chronic) condition. If gastritis is not treated, it can lead to stomach bleeding and ulcers. CAUSES  Gastritis occurs when the stomach lining is weak or damaged. Digestive juices from the stomach then inflame the weakened stomach lining. The stomach lining may be weak or damaged  due to viral or bacterial infections. One common bacterial infection is the Helicobacter pylori infection. Gastritis can also result from excessive alcohol consumption, taking certain medicines, or having too much acid in the stomach.  SYMPTOMS  In some cases, there are no symptoms. When symptoms are present, they may include:  Pain or a burning sensation in the upper abdomen.  Nausea.  Vomiting.  An uncomfortable feeling of fullness after eating. DIAGNOSIS  Your caregiver may suspect you have gastritis based on your symptoms and a physical exam. To determine the cause of your gastritis, your caregiver may perform the following:  Blood or stool tests to check for the H pylori bacterium.  Gastroscopy. A thin, flexible tube (endoscope) is passed down the esophagus and into the stomach. The endoscope has a light and camera on the end. Your caregiver uses the endoscope to view the inside of the stomach.  Taking a tissue sample (biopsy) from the stomach to examine under a microscope. TREATMENT  Depending on the cause of your gastritis, medicines may be  prescribed. If you have a bacterial infection, such as an H pylori infection, antibiotics may be given. If your gastritis is caused by too much acid in the stomach, H2 blockers or antacids may be given. Your caregiver may recommend that you stop taking aspirin, ibuprofen, or other nonsteroidal anti-inflammatory drugs (NSAIDs). HOME CARE INSTRUCTIONS  Only take over-the-counter or prescription medicines as directed by your caregiver.  If you were given antibiotic medicines, take them as directed. Finish them even if you start to feel better.  Drink enough fluids to keep your urine clear or pale yellow.  Avoid foods and drinks that make your symptoms worse, such as:  Caffeine or alcoholic drinks.  Chocolate.  Peppermint or mint flavorings.  Garlic and onions.  Spicy foods.  Citrus fruits, such as oranges, lemons, or limes.  Tomato-based foods such as sauce, chili, salsa, and pizza.  Fried and fatty foods.  Eat small, frequent meals instead of large meals. SEEK IMMEDIATE MEDICAL CARE IF:   You have black or dark red stools.  You vomit blood or material that looks like coffee grounds.  You are unable to keep fluids down.  Your abdominal pain gets worse.  You have a fever.  You do not feel better after 1 week.  You have any other questions or concerns. MAKE SURE YOU:  Understand these instructions.  Will watch your condition.  Will get help right away if you are not doing well or get worse.   This information is not intended to replace advice given to you by your health care provider. Make sure you discuss any questions you have with your health care provider.   Document Released: 12/21/2000 Document Revised: 06/28/2011 Document Reviewed: 02/09/2011 Elsevier Interactive Patient Education [employer].

## 2015-02-10 NOTE — Anesthesia Postprocedure Evaluation (Signed)
Anesthesia Post Note  Patient: Billy Carson  Procedure(s) Performed: Procedure(s) (LRB): COLONOSCOPY WITH PROPOFOL (N/A) ESOPHAGOGASTRODUODENOSCOPY (EGD) WITH PROPOFOL (N/A) POLYPECTOMY (N/A)  Patient location during evaluation: PACU Anesthesia Type: MAC Level of consciousness: awake and alert Pain management: pain level controlled Vital Signs Assessment: post-procedure vital signs reviewed and stable Respiratory status: spontaneous breathing Cardiovascular status: blood pressure returned to baseline Postop Assessment: no signs of nausea or vomiting Anesthetic complications: no    Last Vitals:  Filed Vitals:   02/10/15 1215 02/10/15 1221  BP: 136/85   Pulse: 55 58  Temp:    Resp: 18 19    Last Pain: There were no vitals filed for this visit.               Honorio Devol

## 2015-02-10 NOTE — H&P (Signed)
Primary Care Physician:  Jani Gravel, MD Primary Gastroenterologist:  Dr. Oneida Alar  Pre-Procedure History & Physical: HPI:  Billy Carson is a 70 y.o. male here for  ANEMIA.  Past Medical History  Diagnosis Date  . Hypertension   . Hyperlipidemia   . Stroke [facility])     left sided weakness    Past Surgical History  Procedure Laterality Date  . Tonsillectomy    . Cataract extraction w/phaco Left 10/14/2013    Procedure: CATARACT EXTRACTION PHACO AND INTRAOCULAR LENS PLACEMENT LEFT EYE CDE=11.04;  Surgeon: Williams Che, MD;  Location: [facility];  Service: Ophthalmology;  Laterality: Left;  . Cataract extraction w/phaco Right 12/30/2013    Procedure: CATARACT EXTRACTION PHACO AND INTRAOCULAR LENS PLACEMENT; CDE:  110.53;  Surgeon: Williams Che, MD;  Location: [facility];  Service: Ophthalmology;  Laterality: Right;    Prior to Admission medications   Medication Sig Start Date End Date Taking? Authorizing Provider  amLODipine (NORVASC) 5 MG tablet Take 5 mg by mouth daily.   Yes Historical Provider, MD  aspirin EC 81 MG tablet Take 81 mg by mouth daily.   Yes Historical Provider, MD  atorvastatin (LIPITOR) 10 MG tablet Take 10 mg by mouth daily at 6 PM.  11/26/14  Yes Historical Provider, MD  bisacodyl (DULCOLAX) 5 MG EC tablet Take 1 tablet (5 mg total) by mouth daily as needed for moderate constipation. 12/24/14  Yes Carlis Stable, NP  cholecalciferol (VITAMIN D) 1000 UNITS tablet Take 1,000 Units by mouth daily.   Yes Historical Provider, MD  cyanocobalamin (,VITAMIN B-12,) 1000 MCG/ML injection Inject 1,000 mcg into the muscle every 30 (thirty) days.   Yes Historical Provider, MD  folic acid (FOLVITE) Q000111Q MCG tablet Take 400 mcg by mouth daily.   Yes Historical Provider, MD  hydrALAZINE (APRESOLINE) 10 MG tablet Take 1 tablet (10 mg total) by mouth every 8 (eight) hours. 09/11/12  Yes Costin Karlyne Greenspan, MD  lisinopril (PRINIVIL,ZESTRIL) 20 MG tablet Take 20 mg by mouth daily.   Yes  Historical Provider, MD  metoprolol tartrate (LOPRESSOR) 25 MG tablet Take 12.5 mg by mouth 2 (two) times daily.   Yes Historical Provider, MD  Multiple Vitamin (MULTIVITAMIN WITH MINERALS) TABS tablet Take 1 tablet by mouth daily. 09/11/12  Yes Costin Karlyne Greenspan, MD  polyethylene glycol-electrolytes (NULYTELY/GOLYTELY) 420 G solution Take 4,000 mLs by mouth once. 12/24/14  Yes Carlis Stable, NP  senna-docusate (SENOKOT-S) 8.6-50 MG per tablet Take 1 tablet by mouth daily.   Yes Historical Provider, MD  thiamine 100 MG tablet Take 100 mg by mouth daily.   Yes Historical Provider, MD  phosphate laxative (FLEET) 2.7-7.2 GM/15ML solution Take 15 mLs by mouth once. Patient not taking: Reported on 01/13/2015 12/24/14   Carlis Stable, NP    Allergies as of 12/24/2014  . (No Known Allergies)    Family History  Problem Relation Age of Onset  . Colon cancer Neg Hx     Social History   Social History  . Marital Status: Married    Spouse Name: N/A  . Number of Children: N/A  . Years of Education: N/A   Occupational History  . Not on file.   Social History Main Topics  . Smoking status: Former Smoker -- 1.00 packs/day for 20 years    Types: Cigarettes    Quit date: 10/03/2012  . Smokeless tobacco: Never Used  . Alcohol Use: No     Comment: Denies current ETOH since  12/2013; Previously 1 pint per day per pt 12/24/2013 x 8-10 years.  . Drug Use: No  . Sexual Activity: No   Other Topics Concern  . Not on file   Social History Narrative    Review of Systems: See HPI, otherwise negative ROS   Physical Exam: BP 132/84 mmHg  Pulse 56  Temp(Src) 98.2 F (36.8 C) (Oral)  Resp 18  SpO2 94% General:   Alert,  pleasant and cooperative in NAD Head:  Normocephalic and atraumatic. Neck:  Supple; Lungs:  Clear throughout to auscultation.    Heart:  Regular rate and rhythm. Abdomen:  Soft, nontender and nondistended. Normal bowel sounds, without guarding, and without rebound.   Neurologic:   Alert and  oriented x4;  grossly normal neurologically.  Impression/Plan:     Anemia  PLAN:  1. TCS/?EGD TODAY

## 2015-02-10 NOTE — Transfer of Care (Signed)
Immediate Anesthesia Transfer of Care Note  Patient: Billy Carson  Procedure(s) Performed: Procedure(s) with comments: COLONOSCOPY WITH PROPOFOL (N/A) - 1030 ESOPHAGOGASTRODUODENOSCOPY (EGD) WITH PROPOFOL (N/A) POLYPECTOMY (N/A) - cecum x 1 and ascending colon polyp x1  Patient Location: PACU  Anesthesia Type:MAC  Level of Consciousness: awake  Airway & Oxygen Therapy: Patient Spontanous Breathing and Patient connected to nasal cannula oxygen  Post-op Assessment: Report given to RN  Post vital signs: Reviewed and stable  Last Vitals:  Filed Vitals:   02/10/15 0932  BP: 132/84  Pulse: 56  Temp: 36.8 C  Resp: 18    Complications: No apparent anesthesia complications

## 2015-02-10 NOTE — Anesthesia Preprocedure Evaluation (Signed)
Anesthesia Evaluation  Patient identified by MRN, date of birth, ID band Patient awake    Reviewed: Allergy & Precautions, H&P , NPO status , Patient's Chart, lab work & pertinent test results  Airway Mallampati: II  TM Distance: >3 FB     Dental  (+) Edentulous Upper, Edentulous Lower   Pulmonary former smoker,    breath sounds clear to auscultation       Cardiovascular hypertension, Pt. on medications  Rhythm:Regular Rate:Normal     Neuro/Psych CVA, Residual Symptoms    GI/Hepatic (+)     substance abuse  alcohol use,   Endo/Other    Renal/GU      Musculoskeletal   Abdominal   Peds  Hematology   Anesthesia Other Findings   Reproductive/Obstetrics                             Anesthesia Physical Anesthesia Plan  ASA: III  Anesthesia Plan: MAC   Post-op Pain Management:    Induction: Intravenous  Airway Management Planned: Simple Face Mask  Additional Equipment:   Intra-op Plan:   Post-operative Plan:   Informed Consent: I have reviewed the patients History and Physical, chart, labs and discussed the procedure including the risks, benefits and alternatives for the proposed anesthesia with the patient or authorized representative who has indicated his/her understanding and acceptance.     Plan Discussed with:   Anesthesia Plan Comments:         Anesthesia Quick Evaluation

## 2015-02-10 NOTE — Op Note (Signed)
[facility] [address] [place], [address]   ENDOSCOPY PROCEDURE REPORT  PATIENT: Billy Carson, Billy Carson  MR#: [id number] BIRTHDATE: [birth year] , 69  yrs. old GENDER: male  ENDOSCOPIST: Danie Binder, MD REFERRED [id number] Kim, M.D.  PROCEDURE DATE: 02-22-15 PROCEDURE:   EGD w/ biopsy and EGD w/ control of bleeding  INDICATIONS:anemia. MEDICATIONS: Monitored anesthesia care TOPICAL ANESTHETIC:   Viscous Xylocaine ASA CLASS:  DESCRIPTION OF PROCEDURE:     Physical exam was performed.  Informed consent was obtained from the patient after explaining the benefits, risks, and alternatives to the procedure.  The patient was connected to the monitor and placed in the left lateral position.  Continuous oxygen was provided by nasal cannula and IV medicine administered through an indwelling cannula.  After administration of sedation, the patients esophagus was intubated and the [id number]  endoscope was advanced under direct visualization to the second portion of the duodenum.  The scope was removed slowly by carefully examining the color, texture, anatomy, and integrity of the mucosa on the way out.  The patient was recovered in endoscopy and discharged home in satisfactory condition.  Estimated blood loss is zero unless otherwise noted in this procedure report.    ESOPHAGUS: MASS LOCATED 25 CM -31 CM FROM THE TEETH. NARROWING LUMEN TO 10-11 CM. TEAR CREATED IN MID ESOPHAGUS. TWO CLIPS PLACED TO CONTROL BLEEDING. 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  SMALL HIATAL HERNIA.  MILD ERYTHEMA/EDEMA IN ANTRUM.  COLD BIOPSIES OBTAINED. DUODENUM: Mild duodenal inflammation was found in the duodenal bulb.   The duodenal mucosa showed no abnormalities in the 2nd part of the duodenum. COMPLICATIONS: There were no immediate complications.  ENDOSCOPIC IMPRESSION: 1.   ANEMIA DUE TO ESOPHAGEAL MASS, CRI, AND COLON POLYPS 2.   MILD GASTRITIS AND  DUODENITIS  RECOMMENDATIONS: STOP ASPIRIN. FOLLOW A SOFT MECHANICAL DIET. NO MRI FOR 30 DAYS DUE TO METAL CLIPS PLACED IN THE COLON AND ESOPHAGUS. AVOID ITEMS THAT TRIGGER GASTRITIS. AWAIT BIOPSY RESULTS. Next colonoscopy in 1-3 years.  ALL SISTERS, BROTHERS, CHILDREN, AND PARENTS NEED TO HAVE A COLONOSCOPY STARTING AT THE AGE OF 40.  REPEAT EXAM:  eSigned:  Danie Binder, MD February 22, 2015 9:14 PM CPT CODES: ICD CODES:  The ICD and CPT codes recommended by this software are interpretations from the data that the clinical staff has captured with the software.  The verification of the translation of this report to the ICD and CPT codes and modifiers is the sole responsibility of the health care institution and practicing physician where this report was generated.  [place]. will not be held responsible for the validity of the ICD and CPT codes included on this report.  AMA assumes no liability for data contained or not contained herein. CPT is a Designer, television/film set of the [employer].

## 2015-02-10 NOTE — Op Note (Signed)
[facility] [address] [place], [address]   COLONOSCOPY PROCEDURE REPORT  PATIENT: Billy Carson, Billy Carson  MR#: [id number] BIRTHDATE: [birth year] , 69  yrs. old GENDER: male ENDOSCOPIST: Danie Binder, MD REFERRED KH:[id number] Kim, M.D.             PROCEDURE DATE:  Feb 23, 2015 PROCEDURE:   Colonoscopy with cold biopsy AND snare polypectomy, and Submucosal injection: SPOT(2 CC), EPI(2CC) INDICATIONS:anemia, non-specific.  MEDICATIONS: Monitored anesthesia care DESCRIPTION OF PROCEDURE:    Physical exam was performed.  Informed consent was obtained from the patient after explaining the benefits, risks, and alternatives to procedure.  The patient was connected to monitor and placed in left lateral position. Continuous oxygen was provided by nasal cannula and IV medicine administered through an indwelling cannula.  After administration of sedation and rectal exam, the patients rectum was intubated and the [id number]  colonoscope was advanced under direct visualization to the cecum.  The scope was removed slowly by carefully examining the color, texture, anatomy, and integrity mucosa on the way out.  The patient was recovered in endoscopy and discharged home in satisfactory condition. Estimated blood loss is zero unless otherwise noted in this procedure report.    COLON FINDINGS: A sessile polyp measuring 4 mm in size was found in the ascending colon REMOVED  with cold forceps.  , Two sessile polyps ranging from 6 to 41mm in size were found at the cecum and in the sigmoid colon. REMOVED using snare cautery.  , Two pedunculated polyps ranging from 12 to 28mm in size were found in the sigmoid colon.TATTOO PROXIMAL AND DISTAL TO LARGE PEDUNCULATED POLYP & 2 CC EPI INJECTED INTO STALK. 2 CLIPS PLACED AT BASE OF PEDUNCULATED POLYPS. A polypectomy was performed using snare cautery.  The resection was complete  Large internal hemorrhoids were found.    PREP QUALITY:  good.  CECAL W/D TIME: 38 minutes   COMPLICATIONS: None  ENDOSCOPIC IMPRESSION: 1.   ANEMIA DUE TO FIVE COLON POLYPS, CRI, AND ESOPAHGEAL MASS 2.   Large internal hemorrhoids  RECOMMENDATIONS: STOP ASPIRIN. FOLLOW A SOFT MECHANICAL DIET. NO MRI FOR 30 DAYS DUE TO METAL CLIPS PLACED IN THE COLON AND ESOPHAGUS. AVOID ITEMS THAT TRIGGER GASTRITIS. AWAIT BIOPSY RESULTS. Next colonoscopy in 1-3 years.  ALL FIRST DEGREE RELATIVES NEED TCS AT THE AGE OF 40.  eSigned:  Danie Binder, MD 02/23/2015 9:03 PM CPT CODES: ICD CODES:  The ICD and CPT codes recommended by this software are interpretations from the data that the clinical staff has captured with the software.  The verification of the translation of this report to the ICD and CPT codes and modifiers is the sole responsibility of the health care institution and practicing physician where this report was generated.  [employer] will not be held responsible for the validity of the ICD and CPT codes included on this report.  AMA assumes no liability for data contained or not contained herein. CPT is a Designer, television/film set of the [employer].

## 2015-02-12 ENCOUNTER — Other Ambulatory Visit (HOSPITAL_COMMUNITY): Payer: Self-pay | Admitting: Oncology

## 2015-02-16 ENCOUNTER — Encounter (HOSPITAL_COMMUNITY): Payer: Self-pay | Admitting: Gastroenterology

## 2015-02-16 ENCOUNTER — Telehealth: Payer: Self-pay | Admitting: Gastroenterology

## 2015-02-16 ENCOUNTER — Encounter (HOSPITAL_COMMUNITY): Payer: Medicare Other | Attending: Hematology & Oncology | Admitting: Hematology & Oncology

## 2015-02-16 VITALS — BP 135/69 | HR 61 | Temp 98.4°F | Resp 18 | Wt 208.0 lb

## 2015-02-16 DIAGNOSIS — R634 Abnormal weight loss: Secondary | ICD-10-CM | POA: Diagnosis not present

## 2015-02-16 DIAGNOSIS — C159 Malignant neoplasm of esophagus, unspecified: Secondary | ICD-10-CM | POA: Diagnosis not present

## 2015-02-16 NOTE — Progress Notes (Signed)
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 male is here because of newly diagnosed squamous cell carcinoma of the esophagus.  He also has a sigmoid polyp with high grade dysplasia. 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 He is still able to ambulate with the assistance of a walker or a cane.   Mr. Koel is accompanied by his daughter, Billy Carson, and presents in a wheelchair. He lives at [facility] assisted living facility with his wife.   A doctor sent him to Dr. Oneida Alar when he was having trouble swallowing. He experiences pain while swallowing. He denies any difficulty swallowing foods, no regurgitation or vomiting. Reports a little blood in stool for about 3 to 4 months. He has lost weight "quite a bit here lately". Noting that the facility he lives at weighs him regularly. Dr. Oneida Alar' nurse practitioner spoke with the patient's daughter about his diagnosis.    They present today for consultation regarding management of his esophageal carcinoma and also his dysplastic colon polyp.   MEDICAL HISTORY:  Past Medical History  Diagnosis Date  . Hypertension   . Hyperlipidemia   . Stroke [facility])     left sided weakness    SURGICAL HISTORY: Past Surgical History   Procedure Laterality Date  . Tonsillectomy    . Cataract extraction w/phaco Left 10/14/2013    Procedure: CATARACT EXTRACTION PHACO AND INTRAOCULAR LENS PLACEMENT LEFT EYE CDE=11.04;  Surgeon: Williams Che, MD;  Location: [facility] ORS;  Service: Ophthalmology;  Laterality: Left;  . Cataract extraction w/phaco Right 12/30/2013    Procedure: CATARACT EXTRACTION PHACO AND INTRAOCULAR LENS PLACEMENT; CDE:  110.53;  Surgeon: Williams Che, MD;  Location: [facility] ORS;  Service: Ophthalmology;  Laterality: Right;  . Colonoscopy with propofol N/A 02/10/2015    Procedure: COLONOSCOPY WITH PROPOFOL;  Surgeon: Danie Binder, MD;  Location: [facility] ENDO SU[facility];  Service: Endoscopy;  Laterality: N/A;  1030  . Esophagogastroduodenoscopy (egd) with propofol N/A 02/10/2015    Procedure: ESOPHAGOGASTRODUODENOSCOPY (EGD) WITH PROPOFOL;  Surgeon: Danie Binder, MD;  Location: [facility] END[facility] SU[facility];  Service: Endoscopy;  Laterality: N/A;  . Polypectomy N/A 02/10/2015    Procedure: POLYPECTOMY;  Surgeon: Danie Binder, MD;  Location: [facility];  Service: Endoscopy;  Laterality: N/A;  cecum x 1 and ascending colon polyp x1, Medium sigmoid colon polyp, Medium and Large sigmoid colon polyps  . Biopsy N/A 02/10/2015    Procedure: BIOPSY;  Surgeon: Danie Binder, MD;  Location: [facility];  Service: Endoscopy;  Laterality: N/A;  Esophageal mass biopsies, Gastric biopsies    SOCIAL HISTORY: Social History   Social History  . Marital Status: Married    Spouse Name: N/A  . Number of Children: N/A  . Years of Education: N/A   Occupational History  . Not on file.  Social History Main Topics  . Smoking status: Former Smoker -- 1.00 packs/day for 20 years    Types: Cigarettes    Quit date: 10/03/2012  . Smokeless tobacco: Never Used  . Alcohol Use: No     Comment: Denies current ETOH since 12/2013; Previously 1 pint per day per pt 12/24/2013 x 8-10 years.  . Drug Use: No  . Sexual Activity: No   Other Topics Concern  .  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 Colon cancer Neg Hx    has no family status information on file.   Father deceased at 96 yo of a stroke Mother deceased, not sure of the cause 4 brothers and 5 sisters Only 1 sister still living. 1 brother died of throat cancer  ALLERGIES:  has No Known Allergies.  MEDICATIONS:  Current Outpatient Prescriptions  Medication Sig Dispense Refill  . atorvastatin (LIPITOR) 10 MG tablet Take 10 mg by mouth daily at 6 PM.     . bisacodyl (DULCOLAX) 5 MG EC tablet Take 1 tablet (5 mg total) by mouth daily as needed for moderate constipation. 2 tablet 0  . cholecalciferol (VITAMIN D) 1000 UNITS tablet Take 1,000 Units by mouth daily.    . cyanocobalamin (,VITAMIN B-12,) 1000 MCG/ML injection Inject 1,000 mcg into the muscle every 30 (thirty) days.    . folic acid (FOLVITE) Q000111Q MCG tablet Take 400 mcg by mouth daily.    . hydrALAZINE (APRESOLINE) 10 MG tablet Take 1 tablet (10 mg total) by mouth every 8 (eight) hours. 90 tablet 1  . lisinopril (PRINIVIL,ZESTRIL) 20 MG tablet Take 20 mg by mouth daily.    . metoprolol tartrate (LOPRESSOR) 25 MG tablet Take 12.5 mg by mouth 2 (two) times daily.    . Multiple Vitamin (MULTIVITAMIN WITH MINERALS) TABS tablet Take 1 tablet by mouth daily. 30 tablet 1  . promethazine (PHENERGAN) 25 MG tablet Take 25 mg by mouth every 6 (six) hours as needed for nausea or vomiting.    . senna-docusate (SENOKOT-S) 8.6-50 MG per tablet Take 1 tablet by mouth daily.    Marland Kitchen thiamine 100 MG tablet Take 100 mg by mouth daily. Reported on 02/16/2015     No current facility-administered medications for this visit.    Review of Systems  Constitutional: Positive for weight loss.  HENT: Negative.        Trouble swallowing.  Eyes: Negative.     Respiratory: Negative.   Cardiovascular: Negative.   Gastrointestinal: Positive for blood in stool.  Genitourinary: Negative.   Musculoskeletal: Negative.   Skin: Negative.   Neurological: Positive for sensory change, speech change, focal weakness and weakness.       Left sided weakness associated with prior stroke.  Endo/Heme/Allergies: Negative.   Psychiatric/Behavioral: Negative.   All other systems reviewed and are negative.  14 point ROS was done and is otherwise as detailed above or in HPI   PHYSICAL EXAMINATION: ECOG PERFORMANCE STATUS: 2 - Symptomatic, <50% confined to bed   Filed Vitals:   02/16/15 1458  BP: 135/69  Pulse: 61  Temp: 98.4 F (36.9 C)  Resp: 18   Filed Weights   02/16/15 1458  Weight: 208 lb (94.348 kg)    Physical Exam  Constitutional: He is oriented to person, place, and time and well-developed,  well-nourished, and in no distress.  In wheelchair. Wears glasses. Able to get on exam table with assistance.  HENT:  Head: Normocephalic and atraumatic.  Mouth/Throat: Oropharynx is clear and moist.  Eyes: Conjunctivae and EOM are normal. Pupils are equal, round, and reactive to light. Right eye exhibits no discharge. Left eye exhibits no discharge.  Right pupil deformity noted.  Neck: Normal range of motion. Neck supple. No JVD present.  Cardiovascular: Normal rate and regular rhythm.   Pulmonary/Chest: Effort normal and breath sounds normal.  Abdominal: Soft. Bowel sounds are normal. He exhibits no distension and no mass. There is no tenderness. There is no rebound and no guarding.  Musculoskeletal: Normal range of motion. He exhibits no edema or tenderness.  Lymphadenopathy:    He has no cervical adenopathy.  Neurological: He is alert and oriented to person, place, and time. No cranial nerve deficit. He exhibits abnormal muscle tone. Coordination abnormal.  Left sided weakness in both upper and lower extremities. Limited grip strength in the  left hand.  Skin: Skin is warm and dry.  Psychiatric: Memory normal.  Nursing note and vitals reviewed.   LABORATORY DATA:  I have reviewed the data as listed Lab Results  Component Value Date   WBC 7.9 01/16/2015   HGB 12.9* 01/16/2015   HCT 38.8* 01/16/2015   MCV 95.1 01/16/2015   PLT 218 01/16/2015   CMP     Component Value Date/Time   NA 139 01/16/2015 1330   K 4.3 01/16/2015 1330   CL 105 01/16/2015 1330   CO2 26 01/16/2015 1330   GLUCOSE 109* 01/16/2015 1330   BUN 18 01/16/2015 1330   CREATININE 1.36* 01/16/2015 1330   CALCIUM 9.5 01/16/2015 1330   GFRNONAA 51* 01/16/2015 1330   GFRAA 59* 01/16/2015 1330    PATHOLOGY:  PATHOLOGY:   RADIOGRAPHIC STUDIES: I have personally reviewed the radiological images as listed and agreed with the findings in the report.  CLINICAL DATA: 70 year old male with left-sided weakness. Inability to move left extremities. Stroke.  EXAM: MRI HEAD WITHOUT CONTRAST  MRA HEAD WITHOUT CONTRAST  TECHNIQUE: Multiplanar, multiecho pulse sequences of the brain and surrounding structures were obtained without intravenous contrast. Angiographic images of the head were obtained using MRA technique without contrast.  COMPARISON: Head CT without contrast 09/06/2012.  FINDINGS: MRI HEAD FINDINGS  Confluent restricted diffusion in the posterior right lentiform nuclei probably also affecting the posterior limb of the right internal and external capsules. This infarct does extend toward the right Corona radiata.  In addition, there are punctate areas of restricted diffusion in the left posterior hemisphere along the parieto-occipital sulcus (series 3, image 39) and also in the right occipital lobe.  No associated mass effect or hemorrhage with these findings. Major intracranial vascular flow voids are preserved.  Underlying numerous chronic lacunar infarcts in the bilateral deep gray matter nuclei, cerebellar  hemispheres, and patchy and confluent bilateral cerebral white matter T2 and FLAIR hyperintensity. There is a chronic micro hemorrhage in the left posterior temporal lobe (series 13, image 13).  Ventricular size and configuration are within normal limits. #2 No midline shift, mass effect, or evidence of intracranial mass lesion. Negative pituitary, cervicomedullary junction visualized cervical spine. Normal bone marrow signal.  Visualized orbit soft tissues are within normal limits. Minor paranasal sinus mucosal thickening. Mastoids are clear. Negative scalp soft tissues.  MRA HEAD FINDINGS  Antegrade flow in the posterior circulation. Codominant distal vertebral arteries. Patent left PICA origin. Patent vertebrobasilar junction. No basilar  artery stenosis. SCA and PCA origins are patent. Left posterior communicating artery is present, the right is diminutive or absent. There is mild focal irregularity of the right PCA P1 segment (series 106, image 26). Otherwise the bilateral PCA branches are within normal limits.  Antegrade flow in both ICA siphons. No ICA stenosis. Ophthalmic and left posterior communicating artery origins are within normal limits. Small infundibula at the right ICA terminus suspected.  Carotid termini are patent with normal MCA and ACA origins. Anterior communicating artery and proximal ACA branches are within normal limits. Questionable moderate irregularity and stenosis of the bilateral ACA is at the callosum marginal artery level. Bilateral MCA M1 segments are patent without stenosis. Both MCA bifurcations are patent. Visualized bilateral MCA branches are patent with mild branch irregularity.  IMPRESSION: MRI HEAD IMPRESSION  1. Acute lacunar infarct of the right lentiform nuclei, and likely affecting the posterior limbs of the right internal and external capsules. No associated mass effect or hemorrhage.  2. Underlying advanced chronic  small vessel ischemia, such that punctate acute infarcts also noted in the bilateral PCA territories probably reflect synchronous small vessel disease.  MRA HEAD IMPRESSION  1. Mild irregularity of the right PCA P1 segment which could be related to the acute MRI findings on the basis of right thalamus striate artery origin involvement.  2. Otherwise mild intracranial atherosclerosis. Questionable moderate stenoses of the ACA callosomarginal arteries, versus artifact.   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 We discussed the current diagnosis and means of further evaluation along with potential treatment planning.  I do feel the patient is a reasonable candidate for therapy and would recommend completing staging.  I have ordered a PET/CT. If disease is confined I will refer him for a staging EUS and to XRT.  The patient and his daughter were given an NCCN informational booklet about esophageal cancer.   Advised for the patient to avoid further weight loss by drinking Boost or Ensure twice daily. We will send this information to the [facility]. He will need nutritional consultation moving forward. If treatment is pursued we will discuss feeding tube placement.   I will see him back post PET for further recommendations.   Orders Placed This Encounter  Procedures  .  NM PET Image Initial (PI) Skull Base To Thigh    Standing Status: Future     Number of Occurrences:      Standing Expiration Date: 02/16/2016    Order Specific Question:  Reason for Exam (SYMPTOM  OR DIAGNOSIS REQUIRED)    Answer:  esophageal carcinoma    Order Specific Question:  Preferred imaging location?    Answer:  [facility]    Order Specific Question:  If indicated for the ordered procedure, I authorize the administration of a radiopharmaceutical per Radiology protocol    Answer:  Yes    All questions were answered. The patient knows to call the clinic  with any problems, questions or concerns.  This document serves as a record of services personally performed by Ancil Linsey, MD. It was created on her behalf by Arlyce Harman, a trained medical scribe. The creation of this record is based on the scribe's personal observations and the provider's statements to them. This document has been checked and approved by the attending provider.  I have reviewed the above documentation for accuracy and completeness, and I agree with the above.  This note was electronically signed.    Molli Hazard, MD  02/16/2015 3:29 PM

## 2015-02-16 NOTE — Telephone Encounter (Signed)
PLEASE CALL PT'S DAUGHTER. HIS ESOPHAGUS BIOPSIES SHOW CANCER OF THE ESOPHAGUS. HE NEEDS TO SEE ONCOLOGY. HIS COLON BIOPOSIES SHOW ADVANCED ADENOMAS AND WE MAY CONSIDER A REPEAT TCS IN ONE YEAR IF HIS HEALTH PERMITS.

## 2015-02-16 NOTE — Telephone Encounter (Signed)
I called pt's daughter, Katha Hamming @ [phone] and informed her. She said the PA from the facility had called and told her about the results but she was not aware of his appt with oncology today at 3:00 Pm. She will call the facility and inquire and call if me back if there are any questions.

## 2015-02-16 NOTE — Patient Instructions (Addendum)
[place] at [facility] Discharge Instructions  RECOMMENDATIONS MADE BY THE CONSULTANT AND ANY TEST RESULTS WILL BE SENT TO YOUR REFERRING PHYSICIAN.   Exam and discussion by Dr Whitney Muse today  We need to get better pictures to help [place] determine which treatments will be better, this is a PET scan.  If the PET scan is negative. Then we send you to GI doctors in [place] to do a scope that will better look at your tumor.    Hildred Alamin is our Art therapist.  She will help you get start here through your journey.  She is a good resource if you have any questions.  Burtis Junes is our dietician here.  You will have contact with him through out the process too.  Loren Racer is our Education officer, museum.  She is here on Tuesdays.  She is also a good resource if you have any questions.  We are going to send a note to Dr Ardis Hughs to see if he wants to wait for the PET scan or go ahead and get a endoscopic ultrasound.   Return to see the doctor after the PET scan  Please call the clinic if you have any questions or concerns    Thank you for choosing [place] at [facility] to provide your oncology and hematology care.  To afford each patient quality time with our provider, please arrive at least 15 minutes before your scheduled appointment time.   Beginning January 23rd 2017 lab work for the [facility] will be done in the  Main lab at [facility] on 1st floor. If you have a lab appointment with the [facility] please come in thru the  Main Entrance and check in at the main information desk  You need to re-schedule your appointment should you arrive 10 or more minutes late.  We strive to give you quality time with our providers, and arriving late affects you and other patients whose appointments are after yours.  Also, if you no show three or more times for appointments you may be dismissed from the clinic at the providers discretion.     Again, thank  you for choosing [facility].  Our hope is that these requests will decrease the amount of time that you wait before being seen by our physicians.       _____________________________________________________________  Should you have questions after your visit to [facility], please contact our office at [phone] between the hours of 8:30 a.m. and 4:30 p.m.  Voicemails left after 4:30 p.m. will not be returned until the following business day.  For prescription refill requests, have your pharmacy contact our office.

## 2015-02-16 NOTE — Telephone Encounter (Signed)
Reminder in epic °

## 2015-02-25 ENCOUNTER — Ambulatory Visit (HOSPITAL_COMMUNITY)
Admission: RE | Admit: 2015-02-25 | Discharge: 2015-02-25 | Disposition: A | Discharge status: Home / Self Care | Payer: Medicare Other | Source: Ambulatory Visit | Attending: Hematology & Oncology | Admitting: Hematology & Oncology

## 2015-02-25 DIAGNOSIS — C159 Malignant neoplasm of esophagus, unspecified: Secondary | ICD-10-CM

## 2015-02-25 DIAGNOSIS — M899 Disorder of bone, unspecified: Secondary | ICD-10-CM | POA: Insufficient documentation

## 2015-02-25 DIAGNOSIS — J320 Chronic maxillary sinusitis: Secondary | ICD-10-CM | POA: Diagnosis not present

## 2015-02-25 LAB — GLUCOSE, CAPILLARY: GLUCOSE-CAPILLARY: 99 mg/dL (ref 65–99)

## 2015-02-25 MED ORDER — FLUDEOXYGLUCOSE F - 18 (FDG) INJECTION
9.9100 | Freq: Once | INTRAVENOUS | Status: AC | PRN
  Administered 2015-02-25: 9.91 via INTRAVENOUS

## 2015-02-26 ENCOUNTER — Encounter (HOSPITAL_COMMUNITY): Payer: Self-pay | Admitting: Oncology

## 2015-02-26 ENCOUNTER — Other Ambulatory Visit: Payer: Self-pay

## 2015-02-26 ENCOUNTER — Telehealth: Payer: Self-pay

## 2015-02-26 ENCOUNTER — Encounter (HOSPITAL_BASED_OUTPATIENT_CLINIC_OR_DEPARTMENT_OTHER): Payer: Medicare Other | Admitting: Oncology

## 2015-02-26 VITALS — BP 132/77 | HR 77 | Temp 97.4°F | Resp 20 | Wt 209.3 lb

## 2015-02-26 DIAGNOSIS — C159 Malignant neoplasm of esophagus, unspecified: Secondary | ICD-10-CM | POA: Insufficient documentation

## 2015-02-26 DIAGNOSIS — C154 Malignant neoplasm of middle third of esophagus: Secondary | ICD-10-CM | POA: Diagnosis not present

## 2015-02-26 HISTORY — DX: Malignant neoplasm of esophagus, unspecified: C15.9

## 2015-02-26 NOTE — Patient Instructions (Addendum)
[place] at [facility] Discharge Instructions  RECOMMENDATIONS MADE BY THE CONSULTANT AND ANY TEST RESULTS WILL BE SENT TO YOUR REFERRING PHYSICIAN.  Exam done and seen today by [name] PA-C. Dr. Ardis Hughs planning on esophageal ultrasound on Thursday. Follow up with our clinic on 03/06/15 as scheduled.    Thank you for choosing [place] at [facility] to provide your oncology and hematology care.  To afford each patient quality time with our provider, please arrive at least 15 minutes before your scheduled appointment time.   Beginning January 23rd 2017 lab work for the [facility] will be done in the  Main lab at [facility] on 1st floor. If you have a lab appointment with the [facility] please come in thru the  Main Entrance and check in at the main information desk  You need to re-schedule your appointment should you arrive 10 or more minutes late.  We strive to give you quality time with our providers, and arriving late affects you and other patients whose appointments are after yours.  Also, if you no show three or more times for appointments you may be dismissed from the clinic at the providers discretion.     Again, thank you for choosing [facility].  Our hope is that these requests will decrease the amount of time that you wait before being seen by our physicians.       _____________________________________________________________  Should you have questions after your visit to [facility], please contact our office at [phone] between the hours of 8:30 a.m. and 4:30 p.m.  Voicemails left after 4:30 p.m. will not be returned until the following business day.  For prescription refill requests, have your pharmacy contact our office.

## 2015-02-26 NOTE — Telephone Encounter (Signed)
-----   Message from Baird Cancer, PA-C sent at 02/26/2015 11:21 AM EST ----- Excellent!  I will assume he will see you for an EUS next Thursday and we will plan on seeing him next Friday to help him maneuver through oncology care system (ie Radiation if indicated).  Thank you for your speedy response.  He is a super nice man!  Much more functional than his chart may lead you to believe. TK  ----- Message -----    From: Milus Banister, MD    Sent: 02/26/2015  10:38 AM      To: Barron Alvine, RN, Baird Cancer, PA-C  Hey,  EUS staging to differentiate T2 vs T3 and assess for local adenopathy is a great next step.  If you let him know that my office will contact him about the procedure (will be next week or the week after).   Thanks  Dangelo Guzzetta, He needs upper EUS, radial, ++MAC sedation for newly diagnosed esophageal squamous cell cancer staging.  Next available EUS Thursday  Thanks  ----- Message -----    From: Baird Cancer, PA-C    Sent: 02/26/2015  10:25 AM      To: Milus Banister, MD  Good morning,  New patient to [place] recently.  PET completed yesterday.  No obvious metastatic disease.  Esophageal Ca (Squamous cell).  Do you think EUS is most appropriate next step for staging and treatment planning?  If so, maybe we can get him an appointment with you in the near future.  Thanks  [name], PA-C [facility]

## 2015-02-26 NOTE — Telephone Encounter (Signed)
-----   Message from Milus Banister, MD sent at 02/26/2015 10:38 AM EST ----- Hey,  EUS staging to differentiate T2 vs T3 and assess for local adenopathy is a great next step.  If you let him know that my office will contact him about the procedure (will be next week or the week after).   Thanks  Graceanne Guin, He needs upper EUS, radial, ++MAC sedation for newly diagnosed esophageal squamous cell cancer staging.  Next available EUS Thursday  Thanks  ----- Message -----    From: Baird Cancer, PA-C    Sent: 02/26/2015  10:25 AM      To: Milus Banister, MD  Good morning,  New patient to [place] recently.  PET completed yesterday.  No obvious metastatic disease.  Esophageal Ca (Squamous cell).  Do you think EUS is most appropriate next step for staging and treatment planning?  If so, maybe we can get him an appointment with you in the near future.  Thanks  [name], PA-C [facility]

## 2015-02-26 NOTE — Telephone Encounter (Signed)
I spoke with the pt's daughter and she was given the appt details, she notified me that the pt is in a rehab facility and gave me the number to call and give the details to them as well.  The phone number is [phone].  I have faxed the instructions to [phone]  [facility] attention:  [facility]

## 2015-02-26 NOTE — Progress Notes (Signed)
Jani Gravel, MD [address] [place] [address]  Squamous cell carcinoma of esophagus [facility])  CURRENT THERAPY: Treatment planning.  INTERVAL HISTORY: Billy Carson 70 y.o. 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    Squamous cell carcinoma of esophagus ([place])   02/10/2015 Procedure Colonoscopy by Dr. Oneida Alar   02/10/2015 Pathology Results Colon, polyp(s), sigmoid - TRADITIONAL SERRATED ADENOMA (2.3 CM) WITH HIGH GRADE DYSPLASIA. - HIGH GRADE DYSPLASIA INVOLVES APPROXIMATELY 10 TO 15% OF THE POLYP. - CAUTERIZED EDGES ARE FREE OF HIGH GRADE DYSPLASIA BUT ARE INVOLVED WITH ADENOMATOUS CHAN   02/10/2015 Procedure EGD by Dr. Oneida Alar   02/10/2015 Pathology Results Esophagus, biopsy, mass - INVASIVE SQUAMOUS CELL CARCINOMA.   02/25/2015 PET scan Esophageal mass extends longitudinally over about 6 cm and is associated with very hypermetabolic activity. No definite separable adenopathy in the chest or other metastatic disease identified. Coarsened trabeculation with very faintly accentuated...    I personally reviewed and went over laboratory results with the patient.  The results are noted within this dictation.  I personally reviewed and went over radiographic studies with the patient.  The results are noted within this dictation.    I reviewed the PET imaging report with the patient and family.  PET does not demonstrates Stage IV disease.  It does show findings suspicious for Paget's disease.  As a result, I printed information regarding paget's disease for the patient and his daughter.  I have educated him and his daughter on the next step with regards to staging of his cancer.  Staging is important to help guide treatment options and the patient is knowledgeable about this.  I have discussed  the patient's case with Dr. Ardis Hughs who plans on performing an EUS in the near future.  Prajit denies any complaints today.  Past Medical History  Diagnosis Date  . Hypertension   . Hyperlipidemia   . Stroke [facility])     left sided weakness  . Squamous cell carcinoma of esophagus (HCC) 02/26/2015    has CVA (cerebral infarction); Tobacco abuse; Alcohol abuse; Hypertension; Encounter for screening colonoscopy; and Squamous cell carcinoma of esophagus ([place]) on his problem list.     has No Known Allergies.  Current Outpatient Prescriptions on File Prior to Visit  Medication Sig Dispense Refill  . atorvastatin (LIPITOR) 10 MG tablet Take 10 mg by mouth daily at 6 PM.     . bisacodyl (DULCOLAX) 5 MG EC tablet Take 1 tablet (5 mg total) by mouth daily as needed for moderate constipation. 2 tablet 0  . cholecalciferol (VITAMIN D) 1000 UNITS tablet Take 1,000 Units by mouth daily.    . cyanocobalamin (,VITAMIN B-12,) 1000 MCG/ML injection Inject 1,000 mcg into the muscle every 30 (thirty) days.    . folic acid (FOLVITE) [id number] MCG tablet Take 400 mcg by mouth daily.    . hydrALAZINE (APRESOLINE) 10 MG tablet Take 1 tablet (10 mg total) by mouth every 8 (eight) hours. 90 tablet 1  . lisinopril (PRINIVIL,ZESTRIL) 20 MG tablet Take 20 mg by mouth daily.    . metoprolol tartrate (LOPRESSOR) 25 MG tablet Take 12.5 mg by mouth 2 (two) times daily.    . Multiple Vitamin (MULTIVITAMIN WITH MINERALS) TABS tablet Take 1 tablet by mouth daily. 30 tablet 1  . promethazine (PHENERGAN) 25 MG  tablet Take 25 mg by mouth every 6 (six) hours as needed for nausea or vomiting.    . senna-docusate (SENOKOT-S) 8.6-50 MG per tablet Take 1 tablet by mouth daily.    Marland Kitchen thiamine 100 MG tablet Take 100 mg by mouth daily. Reported on 02/16/2015     No current facility-administered medications on file prior to visit.    Past Surgical History  Procedure Laterality Date  . Tonsillectomy    . Cataract extraction w/phaco Left  10/14/2013    Procedure: CATARACT EXTRACTION PHACO AND INTRAOCULAR LENS PLACEMENT LEFT EYE CDE=11.04;  Surgeon: Williams Che, MD;  Location: [facility];  Service: Ophthalmology;  Laterality: Left;  . Cataract extraction w/phaco Right 12/30/2013    Procedure: CATARACT EXTRACTION PHACO AND INTRAOCULAR LENS PLACEMENT; CDE:  110.53;  Surgeon: Williams Che, MD;  Location: [facility];  Service: Ophthalmology;  Laterality: Right;  . Colonoscopy with propofol N/A 02/10/2015    Procedure: COLONOSCOPY WITH PROPOFOL;  Surgeon: Danie Binder, MD;  Location: [facility];  Service: Endoscopy;  Laterality: N/A;  1030  . Esophagogastroduodenoscopy (egd) with propofol N/A 02/10/2015    Procedure: ESOPHAGOGASTRODUODENOSCOPY (EGD) WITH PROPOFOL;  Surgeon: Danie Binder, MD;  Location: [facility];  Service: Endoscopy;  Laterality: N/A;  . Polypectomy N/A 02/10/2015    Procedure: POLYPECTOMY;  Surgeon: Danie Binder, MD;  Location: [facility];  Service: Endoscopy;  Laterality: N/A;  cecum x 1 and ascending colon polyp x1, Medium sigmoid colon polyp, Medium and Large sigmoid colon polyps  . Biopsy N/A 02/10/2015    Procedure: BIOPSY;  Surgeon: Danie Binder, MD;  Location: [facility];  Service: Endoscopy;  Laterality: N/A;  Esophageal mass biopsies, Gastric biopsies    Denies any headaches, dizziness, double vision, fevers, chills, night sweats, nausea, vomiting, diarrhea, constipation, chest pain, heart palpitations, shortness of breath, blood in stool, black tarry stool, urinary pain, urinary burning, urinary frequency, hematuria.   PHYSICAL EXAMINATION  ECOG PERFORMANCE STATUS: 2 - Symptomatic, <50% confined to bed  Filed Vitals:   02/26/15 1029  BP: 132/77  Pulse: 77  Temp: 97.4 F (36.3 C)  Resp: 20    GENERAL:alert, well nourished, well developed, comfortable, cooperative, smiling and in a wheelchair but able to get on the exam table unassisted, accompanied by his daughter and  great-granddaughter. SKIN: skin color, texture, turgor are normal, no rashes or significant lesions HEAD: Normocephalic, No masses, lesions, tenderness or abnormalities EYES: normal, EOMI, Conjunctiva are pink and non-injected EARS: External ears normal OROPHARYNX:mucous membranes are moist  NECK: trachea midline LYMPH:  not examined BREAST:not examined LUNGS: not examined HEART: not examined ABDOMEN:non-tender BACK: Back symmetric, no curvature. EXTREMITIES:less then 2 second capillary refill, no skin discoloration, no cyanosis  NEURO: alert & oriented x 3 with fluent speech   LABORATORY DATA: CBC    Component Value Date/Time   WBC 7.9 01/16/2015 1330   RBC 4.08* 01/16/2015 1330   HGB 12.9* 01/16/2015 1330   HCT 38.8* 01/16/2015 1330   PLT 218 01/16/2015 1330   MCV 95.1 01/16/2015 1330   MCH 31.6 01/16/2015 1330   MCHC 33.2 01/16/2015 1330   RDW 12.9 01/16/2015 1330   LYMPHSABS 1.3 09/06/2012 1238   MONOABS 1.0 09/06/2012 1238   EOSABS 0.0 09/06/2012 1238   BASOSABS 0.0 09/06/2012 1238      Chemistry      Component Value Date/Time   NA 139 01/16/2015 1330   K 4.3 01/16/2015 1330   CL 105  01/16/2015 1330   CO2 26 01/16/2015 1330   BUN 18 01/16/2015 1330   CREATININE 1.36* 01/16/2015 1330      Component Value Date/Time   CALCIUM 9.5 01/16/2015 1330        PENDING LABS:   RADIOGRAPHIC STUDIES:  Nm Pet Image Initial (pi) Skull Base To Thigh  02/25/2015  CLINICAL DATA:  Initial treatment strategy for squamous cell esophageal carcinoma. Initial diagnosis by endoscopy 02/10/2015 EXAM: NUCLEAR MEDICINE PET SKULL BASE TO THIGH TECHNIQUE: For 9.9 mCi F-18 FDG was injected intravenously. Full-ring PET imaging was performed from the skull base to thigh after the radiotracer. CT data was obtained and used for attenuation correction and anatomic localization. FASTING BLOOD GLUCOSE:  Value: 99 mg/dl COMPARISON:  MR brain from 09/06/2012 FINDINGS: NECK No hypermetabolic  lymph nodes in the neck. Mild chronic right maxillary sinusitis. CHEST Abnormal mid thoracic esophageal region of hypermetabolism extends over a 6 cm vertical excursion, with maximum standard uptake value of 19.1. There is a small metal clip or fiducial along mass hypermetabolic region. No other hypermetabolic activity in the chest. Linear subsegmental atelectasis or scarring in the posterior basal segment right lower lobe. Pleural calcification along the left hemidiaphragm, significance uncertain. ABDOMEN/PELVIS Faintly high activity in both adrenal glands without visible adrenal mass, right adrenal gland maximum SUV 6.8 and left adrenal gland 7.2. Scattered physiologic activity in the bowel. Prominent prostate gland. SKELETON Faintly accentuated metabolic activity in the left hemipelvis, L1 vertebra, left scapula, and right proximal femur with coarsened trabecula, appearance considered very characteristic for Paget's disease of bone. I am skeptical of osseous metastatic disease given the overall pattern and the very faint increased metabolic activity. IMPRESSION: 1. Esophageal mass extends longitudinally over about 6 cm and is associated with very hypermetabolic activity. No definite separable adenopathy in the chest or other metastatic disease identified. 2. 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 3. Faintly increased activity in both adrenal glands without adrenal mass, likely physiologic/incidental. 4. Prominent prostate gland. 5. Mild chronic right maxillary sinusitis. Electronically Signed   By: Van Clines M.D.   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  Oncology history developed.  Staging in [facility] problem list updated with most current information.  Will need to be edited as further staging data becomes available.  I personally reviewed and went over radiographic studies with the patient.  The results are noted within this dictation.    I provided education to the patient and his daughter regarding Paget's Disease.  He is printed "Basic Education" from the patient education portal in up-to-date.  No role for labs today.  I have messaged Dr. Ardis Hughs via [facility] messaging and he agrees that EUS is most appropriate for T staging which is crucial for staging of disease and treatment planning.  Dr. Ardis Hughs has confirmed that he will see the patient as soon as possible, with a possibility of seeing him for an EUS on Thursday, 03/05/2015.  I have let the patient know to expect a call for this appointment.  It would be beneficial for the patient's daughter to know as well so she can accompany the patient.  Mr. Hartfield will return to clinic on Friday. 03/06/2015 for follow-up after EUS.   THERAPY PLAN:  Further staging is needed.  Dr. Ardis Hughs will be performing an EUS next week for T staging.  All questions were answered. The patient knows to call the clinic with any problems, questions or concerns. We can certainly see the patient much sooner if necessary.  Patient and plan discussed with Dr. Ancil Linsey and she is in agreement with the aforementioned.   This note is electronically signed by: Doy Mince 02/26/2015 11:32 AM

## 2015-02-26 NOTE — Assessment & Plan Note (Addendum)
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  Oncology history developed.  Staging in [facility] problem list updated with most current information.  Will need to be edited as further staging data becomes available.  I personally reviewed and went over radiographic studies with the patient.  The results are noted within this dictation.    I provided education to the patient and his daughter regarding Paget's Disease.  He is printed "Basic Education" from the patient education portal in up-to-date.  No role for labs today.  I have messaged Dr. Ardis Hughs via [facility] messaging and he agrees that EUS is most appropriate for T staging which is crucial for staging of disease and treatment planning.  Dr. Ardis Hughs has confirmed that he will see the patient as soon as possible, with a possibility of seeing him for an EUS on Thursday, 03/05/2015.  I have let the patient know to expect a call for this appointment.  It would be beneficial for the patient's daughter to know as well so she can accompany the patient.  Billy Carson will return to clinic on Friday. 03/06/2015 for follow-up after EUS.

## 2015-03-04 ENCOUNTER — Encounter (HOSPITAL_COMMUNITY): Payer: Self-pay | Admitting: *Deleted

## 2015-03-04 NOTE — Progress Notes (Addendum)
Preop instructions [id number] Poe                         Date of Birth    Jan 04, 1946                        Date of Procedure: 03-05-15      Doctor:Dr. Ardis Hughs Time to arrive at [facility]:1130 Report to: Admitting Procedure time: Procedure: Any procedure time changes, MD office will notify you!   Do not eat past midnight the night before your procedure.(To include any tube feedings-must be discontinued), may have clear liquids from midnight until 715 am day of procedure, nothing at all after 715 am day of procedure. Reminder:Follow bowel prep instructions per MD office!   Take these morning medications only with sips of water.(or give through gastrostomy or feeding tube). Metoprolol Tartrate  Note: No Insulin or Diabetic meds should be given or taken the morning of the procedure!   Facility contact:Tawanda Edmonds                 Phone:  Facility [phone]                 Health Care POA: Katha Hamming daughter [phone] to come for procedure per poa, patient signs own consents.  Transportation contact phone#: facility to provide transportation  Please send day of procedure:current med list and meds last taken that day, confirm nothing by mouth status from what time, Patient Demographic info( to include DNR status, problem list, allergies)   RN contact name/phone#: April Reed LPN                         and Fax [employer] card and picture ID Leave all jewelry and other valuables at place where living( no metal or rings to be worn) No contact lens Women-no make-up, no lotions,perfumes,powders Men-no colognes,lotions  Any questions day of procedure,call Endoscopy [phone]!   Sent from :[facility]                   [phone]                  Fax:[phone]  Sent by :[id number] Careers adviser

## 2015-03-04 NOTE — Progress Notes (Signed)
Faxed all pre op instructions to [facility] April reed LPN [phone], all instructions received nd will call if questions.

## 2015-03-05 ENCOUNTER — Other Ambulatory Visit (HOSPITAL_COMMUNITY): Payer: Self-pay | Admitting: Oncology

## 2015-03-05 ENCOUNTER — Encounter (HOSPITAL_COMMUNITY)
Admission: RE | Disposition: A | Discharge status: Home / Self Care | Payer: Self-pay | Source: Ambulatory Visit | Attending: Gastroenterology

## 2015-03-05 ENCOUNTER — Ambulatory Visit (HOSPITAL_COMMUNITY)
Admission: RE | Admit: 2015-03-05 | Discharge: 2015-03-05 | Disposition: A | Discharge status: Home / Self Care | Payer: Medicare Other | Source: Ambulatory Visit | Attending: Gastroenterology | Admitting: Gastroenterology

## 2015-03-05 ENCOUNTER — Encounter (HOSPITAL_COMMUNITY): Payer: Self-pay | Admitting: Gastroenterology

## 2015-03-05 ENCOUNTER — Ambulatory Visit (HOSPITAL_COMMUNITY): Payer: Medicare Other | Admitting: Registered Nurse

## 2015-03-05 DIAGNOSIS — Z79899 Other long term (current) drug therapy: Secondary | ICD-10-CM | POA: Insufficient documentation

## 2015-03-05 DIAGNOSIS — I10 Essential (primary) hypertension: Secondary | ICD-10-CM | POA: Insufficient documentation

## 2015-03-05 DIAGNOSIS — K222 Esophageal obstruction: Secondary | ICD-10-CM | POA: Insufficient documentation

## 2015-03-05 DIAGNOSIS — Z87891 Personal history of nicotine dependence: Secondary | ICD-10-CM | POA: Diagnosis not present

## 2015-03-05 DIAGNOSIS — E785 Hyperlipidemia, unspecified: Secondary | ICD-10-CM | POA: Diagnosis not present

## 2015-03-05 DIAGNOSIS — I69354 Hemiplegia and hemiparesis following cerebral infarction affecting left non-dominant side: Secondary | ICD-10-CM | POA: Insufficient documentation

## 2015-03-05 DIAGNOSIS — C159 Malignant neoplasm of esophagus, unspecified: Secondary | ICD-10-CM

## 2015-03-05 DIAGNOSIS — C154 Malignant neoplasm of middle third of esophagus: Secondary | ICD-10-CM | POA: Diagnosis present

## 2015-03-05 HISTORY — PX: EUS: SHX5427

## 2015-03-05 SURGERY — UPPER ENDOSCOPIC ULTRASOUND (EUS) RADIAL
Anesthesia: Monitor Anesthesia Care

## 2015-03-05 MED ORDER — PROPOFOL 500 MG/50ML IV EMUL
INTRAVENOUS | Status: DC | PRN
  Administered 2015-03-05: 120 ug/kg/min via INTRAVENOUS

## 2015-03-05 MED ORDER — LIDOCAINE HCL (CARDIAC) 20 MG/ML IV SOLN
INTRAVENOUS | Status: DC | PRN
  Administered 2015-03-05: 100 mg via INTRAVENOUS

## 2015-03-05 MED ORDER — LACTATED RINGERS IV SOLN
INTRAVENOUS | Status: DC
  Administered 2015-03-05: 1000 mL via INTRAVENOUS

## 2015-03-05 MED ORDER — SODIUM CHLORIDE 0.9 % IV SOLN: INTRAVENOUS | Status: DC

## 2015-03-05 MED ORDER — PROPOFOL 10 MG/ML IV BOLUS
INTRAVENOUS | Status: AC
  Filled 2015-03-05: qty 40

## 2015-03-05 MED ORDER — LIDOCAINE HCL (CARDIAC) 20 MG/ML IV SOLN
INTRAVENOUS | Status: AC
  Filled 2015-03-05: qty 5

## 2015-03-05 NOTE — Anesthesia Preprocedure Evaluation (Addendum)
Anesthesia Evaluation  Patient identified by MRN, date of birth, ID band Patient awake    Reviewed: Allergy & Precautions, NPO status , Patient's Chart, lab work & pertinent test results  History of Anesthesia Complications Negative for: history of anesthetic complications  Airway Mallampati: II  TM Distance: >3 FB Neck ROM: Full    Dental no notable dental hx. (+) Dental Advisory Given   Pulmonary former smoker,    Pulmonary exam normal breath sounds clear to auscultation       Cardiovascular hypertension, Normal cardiovascular exam Rhythm:Regular Rate:Normal     Neuro/Psych CVA (L sided weakness), Residual Symptoms negative psych ROS   GI/Hepatic negative GI ROS, Neg liver ROS,   Endo/Other  negative endocrine ROS  Renal/GU negative Renal ROS  negative genitourinary   Musculoskeletal negative musculoskeletal ROS (+)   Abdominal   Peds negative pediatric ROS (+)  Hematology negative hematology ROS (+)   Anesthesia Other Findings   Reproductive/Obstetrics negative OB ROS                            Anesthesia Physical Anesthesia Plan  ASA: III  Anesthesia Plan: MAC   Post-op Pain Management:    Induction: Intravenous  Airway Management Planned: Natural Airway and Nasal Cannula  Additional Equipment:   Intra-op Plan:   Post-operative Plan:   Informed Consent: I have reviewed the patients History and Physical, chart, labs and discussed the procedure including the risks, benefits and alternatives for the proposed anesthesia with the patient or authorized representative who has indicated his/her understanding and acceptance.   Dental advisory given  Plan Discussed with: CRNA  Anesthesia Plan Comments:        Anesthesia Quick Evaluation

## 2015-03-05 NOTE — Interval H&P Note (Signed)
History and Physical Interval Note:  03/05/2015 11:39 AM  Billy Carson  has presented today for surgery, with the diagnosis of esophageal squamos cell cancer staging  The various methods of treatment have been discussed with the patient and family. After consideration of risks, benefits and other options for treatment, the patient has consented to  Procedure(s): UPPER ENDOSCOPIC ULTRASOUND (EUS) RADIAL (N/A) as a surgical intervention .  The patient's history has been reviewed, patient examined, no change in status, stable for surgery.  I have reviewed the patient's chart and labs.  Questions were answered to the patient's satisfaction.     Milus Banister

## 2015-03-05 NOTE — Anesthesia Postprocedure Evaluation (Signed)
Anesthesia Post Note  Patient: Billy Carson  Procedure(s) Performed: Procedure(s) (LRB): UPPER ENDOSCOPIC ULTRASOUND (EUS) RADIAL (N/A)  Patient location during evaluation: PACU Anesthesia Type: MAC Level of consciousness: awake and alert Pain management: pain level controlled Vital Signs Assessment: post-procedure vital signs reviewed and stable Respiratory status: spontaneous breathing, nonlabored ventilation, respiratory function stable and patient connected to nasal cannula oxygen Cardiovascular status: blood pressure returned to baseline and stable Postop Assessment: no signs of nausea or vomiting Anesthetic complications: no    Last Vitals:  Filed Vitals:   03/05/15 1300 03/05/15 1310  BP: 134/81 148/84  Pulse: 59 60  Temp:    Resp: 25 21    Last Pain: There were no vitals filed for this visit.               Billy Carson

## 2015-03-05 NOTE — Discharge Instructions (Signed)

## 2015-03-05 NOTE — Transfer of Care (Signed)
Immediate Anesthesia Transfer of Care Note  Patient: Billy Carson  Procedure(s) Performed: Procedure(s): UPPER ENDOSCOPIC ULTRASOUND (EUS) RADIAL (N/A)  Patient Location: PACU and Endoscopy Unit  Anesthesia Type:MAC  Level of Consciousness: awake, alert , oriented and patient cooperative  Airway & Oxygen Therapy: Patient Spontanous Breathing and Patient connected to nasal cannula oxygen  Post-op Assessment: Report given to RN, Post -op Vital signs reviewed and stable and Patient moving all extremities  Post vital signs: Reviewed and stable  Last Vitals:  Filed Vitals:   03/05/15 1138  BP: 153/92  Pulse: 62  Temp: 37.1 C  Resp: 16    Complications: No apparent anesthesia complications

## 2015-03-05 NOTE — H&P (View-Only) (Signed)
Jani Gravel, MD [address] [place] [address]  Squamous cell carcinoma of esophagus [facility])  CURRENT THERAPY: Treatment planning.  INTERVAL HISTORY: Billy Carson 70 y.o. 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    Squamous cell carcinoma of esophagus ([place])   02/10/2015 Procedure Colonoscopy by Dr. Oneida Alar   02/10/2015 Pathology Results Colon, polyp(s), sigmoid - TRADITIONAL SERRATED ADENOMA (2.3 CM) WITH HIGH GRADE DYSPLASIA. - HIGH GRADE DYSPLASIA INVOLVES APPROXIMATELY 10 TO 15% OF THE POLYP. - CAUTERIZED EDGES ARE FREE OF HIGH GRADE DYSPLASIA BUT ARE INVOLVED WITH ADENOMATOUS CHAN   02/10/2015 Procedure EGD by Dr. Oneida Alar   02/10/2015 Pathology Results Esophagus, biopsy, mass - INVASIVE SQUAMOUS CELL CARCINOMA.   02/25/2015 PET scan Esophageal mass extends longitudinally over about 6 cm and is associated with very hypermetabolic activity. No definite separable adenopathy in the chest or other metastatic disease identified. Coarsened trabeculation with very faintly accentuated...    I personally reviewed and went over laboratory results with the patient.  The results are noted within this dictation.  I personally reviewed and went over radiographic studies with the patient.  The results are noted within this dictation.    I reviewed the PET imaging report with the patient and family.  PET does not demonstrates Stage IV disease.  It does show findings suspicious for Paget's disease.  As a result, I printed information regarding paget's disease for the patient and his daughter.  I have educated him and his daughter on the next step with regards to staging of his cancer.  Staging is important to help guide treatment options and the patient is knowledgeable about this.  I have discussed  the patient's case with Dr. Ardis Hughs who plans on performing an EUS in the near future.  Na denies any complaints today.  Past Medical History  Diagnosis Date  . Hypertension   . Hyperlipidemia   . Stroke [facility])     left sided weakness  . Squamous cell carcinoma of esophagus (HCC) 02/26/2015    has CVA (cerebral infarction); Tobacco abuse; Alcohol abuse; Hypertension; Encounter for screening colonoscopy; and Squamous cell carcinoma of esophagus ([place]) on his problem list.     has No Known Allergies.  Current Outpatient Prescriptions on File Prior to Visit  Medication Sig Dispense Refill  . atorvastatin (LIPITOR) 10 MG tablet Take 10 mg by mouth daily at 6 PM.     . bisacodyl (DULCOLAX) 5 MG EC tablet Take 1 tablet (5 mg total) by mouth daily as needed for moderate constipation. 2 tablet 0  . cholecalciferol (VITAMIN D) 1000 UNITS tablet Take 1,000 Units by mouth daily.    . cyanocobalamin (,VITAMIN B-12,) 1000 MCG/ML injection Inject 1,000 mcg into the muscle every 30 (thirty) days.    . folic acid (FOLVITE) [id number] MCG tablet Take 400 mcg by mouth daily.    . hydrALAZINE (APRESOLINE) 10 MG tablet Take 1 tablet (10 mg total) by mouth every 8 (eight) hours. 90 tablet 1  . lisinopril (PRINIVIL,ZESTRIL) 20 MG tablet Take 20 mg by mouth daily.    . metoprolol tartrate (LOPRESSOR) 25 MG tablet Take 12.5 mg by mouth 2 (two) times daily.    . Multiple Vitamin (MULTIVITAMIN WITH MINERALS) TABS tablet Take 1 tablet by mouth daily. 30 tablet 1  . promethazine (PHENERGAN) 25 MG  tablet Take 25 mg by mouth every 6 (six) hours as needed for nausea or vomiting.    . senna-docusate (SENOKOT-S) 8.6-50 MG per tablet Take 1 tablet by mouth daily.    Marland Kitchen thiamine 100 MG tablet Take 100 mg by mouth daily. Reported on 02/16/2015     No current facility-administered medications on file prior to visit.    Past Surgical History  Procedure Laterality Date  . Tonsillectomy    . Cataract extraction w/phaco Left  10/14/2013    Procedure: CATARACT EXTRACTION PHACO AND INTRAOCULAR LENS PLACEMENT LEFT EYE CDE=11.04;  Surgeon: Williams Che, MD;  Location: [facility];  Service: Ophthalmology;  Laterality: Left;  . Cataract extraction w/phaco Right 12/30/2013    Procedure: CATARACT EXTRACTION PHACO AND INTRAOCULAR LENS PLACEMENT; CDE:  110.53;  Surgeon: Williams Che, MD;  Location: [facility];  Service: Ophthalmology;  Laterality: Right;  . Colonoscopy with propofol N/A 02/10/2015    Procedure: COLONOSCOPY WITH PROPOFOL;  Surgeon: Danie Binder, MD;  Location: [facility];  Service: Endoscopy;  Laterality: N/A;  1030  . Esophagogastroduodenoscopy (egd) with propofol N/A 02/10/2015    Procedure: ESOPHAGOGASTRODUODENOSCOPY (EGD) WITH PROPOFOL;  Surgeon: Danie Binder, MD;  Location: [facility];  Service: Endoscopy;  Laterality: N/A;  . Polypectomy N/A 02/10/2015    Procedure: POLYPECTOMY;  Surgeon: Danie Binder, MD;  Location: [facility];  Service: Endoscopy;  Laterality: N/A;  cecum x 1 and ascending colon polyp x1, Medium sigmoid colon polyp, Medium and Large sigmoid colon polyps  . Biopsy N/A 02/10/2015    Procedure: BIOPSY;  Surgeon: Danie Binder, MD;  Location: [facility];  Service: Endoscopy;  Laterality: N/A;  Esophageal mass biopsies, Gastric biopsies    Denies any headaches, dizziness, double vision, fevers, chills, night sweats, nausea, vomiting, diarrhea, constipation, chest pain, heart palpitations, shortness of breath, blood in stool, black tarry stool, urinary pain, urinary burning, urinary frequency, hematuria.   PHYSICAL EXAMINATION  ECOG PERFORMANCE STATUS: 2 - Symptomatic, <50% confined to bed  Filed Vitals:   02/26/15 1029  BP: 132/77  Pulse: 77  Temp: 97.4 F (36.3 C)  Resp: 20    GENERAL:alert, well nourished, well developed, comfortable, cooperative, smiling and in a wheelchair but able to get on the exam table unassisted, accompanied by his daughter and  great-granddaughter. SKIN: skin color, texture, turgor are normal, no rashes or significant lesions HEAD: Normocephalic, No masses, lesions, tenderness or abnormalities EYES: normal, EOMI, Conjunctiva are pink and non-injected EARS: External ears normal OROPHARYNX:mucous membranes are moist  NECK: trachea midline LYMPH:  not examined BREAST:not examined LUNGS: not examined HEART: not examined ABDOMEN:non-tender BACK: Back symmetric, no curvature. EXTREMITIES:less then 2 second capillary refill, no skin discoloration, no cyanosis  NEURO: alert & oriented x 3 with fluent speech   LABORATORY DATA: CBC    Component Value Date/Time   WBC 7.9 01/16/2015 1330   RBC 4.08* 01/16/2015 1330   HGB 12.9* 01/16/2015 1330   HCT 38.8* 01/16/2015 1330   PLT 218 01/16/2015 1330   MCV 95.1 01/16/2015 1330   MCH 31.6 01/16/2015 1330   MCHC 33.2 01/16/2015 1330   RDW 12.9 01/16/2015 1330   LYMPHSABS 1.3 09/06/2012 1238   MONOABS 1.0 09/06/2012 1238   EOSABS 0.0 09/06/2012 1238   BASOSABS 0.0 09/06/2012 1238      Chemistry      Component Value Date/Time   NA 139 01/16/2015 1330   K 4.3 01/16/2015 1330   CL 105  01/16/2015 1330   CO2 26 01/16/2015 1330   BUN 18 01/16/2015 1330   CREATININE 1.36* 01/16/2015 1330      Component Value Date/Time   CALCIUM 9.5 01/16/2015 1330        PENDING LABS:   RADIOGRAPHIC STUDIES:  Nm Pet Image Initial (pi) Skull Base To Thigh  02/25/2015  CLINICAL DATA:  Initial treatment strategy for squamous cell esophageal carcinoma. Initial diagnosis by endoscopy 02/10/2015 EXAM: NUCLEAR MEDICINE PET SKULL BASE TO THIGH TECHNIQUE: For 9.9 mCi F-18 FDG was injected intravenously. Full-ring PET imaging was performed from the skull base to thigh after the radiotracer. CT data was obtained and used for attenuation correction and anatomic localization. FASTING BLOOD GLUCOSE:  Value: 99 mg/dl COMPARISON:  MR brain from 09/06/2012 FINDINGS: NECK No hypermetabolic  lymph nodes in the neck. Mild chronic right maxillary sinusitis. CHEST Abnormal mid thoracic esophageal region of hypermetabolism extends over a 6 cm vertical excursion, with maximum standard uptake value of 19.1. There is a small metal clip or fiducial along mass hypermetabolic region. No other hypermetabolic activity in the chest. Linear subsegmental atelectasis or scarring in the posterior basal segment right lower lobe. Pleural calcification along the left hemidiaphragm, significance uncertain. ABDOMEN/PELVIS Faintly high activity in both adrenal glands without visible adrenal mass, right adrenal gland maximum SUV 6.8 and left adrenal gland 7.2. Scattered physiologic activity in the bowel. Prominent prostate gland. SKELETON Faintly accentuated metabolic activity in the left hemipelvis, L1 vertebra, left scapula, and right proximal femur with coarsened trabecula, appearance considered very characteristic for Paget's disease of bone. I am skeptical of osseous metastatic disease given the overall pattern and the very faint increased metabolic activity. IMPRESSION: 1. Esophageal mass extends longitudinally over about 6 cm and is associated with very hypermetabolic activity. No definite separable adenopathy in the chest or other metastatic disease identified. 2. 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 3. Faintly increased activity in both adrenal glands without adrenal mass, likely physiologic/incidental. 4. Prominent prostate gland. 5. Mild chronic right maxillary sinusitis. Electronically Signed   By: Van Clines M.D.   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  Oncology history developed.  Staging in [facility] problem list updated with most current information.  Will need to be edited as further staging data becomes available.  I personally reviewed and went over radiographic studies with the patient.  The results are noted within this dictation.    I provided education to the patient and his daughter regarding Paget's Disease.  He is printed "Basic Education" from the patient education portal in up-to-date.  No role for labs today.  I have messaged Dr. Ardis Hughs via [facility] messaging and he agrees that EUS is most appropriate for T staging which is crucial for staging of disease and treatment planning.  Dr. Ardis Hughs has confirmed that he will see the patient as soon as possible, with a possibility of seeing him for an EUS on Thursday, 03/05/2015.  I have let the patient know to expect a call for this appointment.  It would be beneficial for the patient's daughter to know as well so she can accompany the patient.  Billy Carson will return to clinic on Friday. 03/06/2015 for follow-up after EUS.   THERAPY PLAN:  Further staging is needed.  Dr. Ardis Hughs will be performing an EUS next week for T staging.  All questions were answered. The patient knows to call the clinic with any problems, questions or concerns. We can certainly see the patient much sooner if necessary.  Patient and plan discussed with Dr. Ancil Linsey and she is in agreement with the aforementioned.   This note is electronically signed by: Doy Mince 02/26/2015 11:32 AM

## 2015-03-05 NOTE — Op Note (Signed)
[facility] [place], [address]   ENDOSCOPIC ULTRASOUND PROCEDURE REPORT  PATIENT: Billy Carson, Billy Carson  MR#: [id number] BIRTHDATE: 1945/07/19  GENDER: male ENDOSCOPIST: Milus Banister, MD REFERRED BY:  Robynn Pane, P.A. PROCEDURE DATE:  03/05/2015 PROCEDURE:   Upper EUS ASA CLASS:      Class III INDICATIONS:   1.  recently diagnosed squamous cell cancer of mid esophagus (Dr.  Oneida Alar EGD); no obvious distant disease on recent PET scan. MEDICATIONS: Monitored anesthesia care  DESCRIPTION OF PROCEDURE:   After the risks benefits and alternatives of the procedure were  explained, informed consent was obtained. The patient was then placed in the left, lateral, decubitus postion and IV sedation was administered. Throughout the procedure, the patients blood pressure, pulse and oxygen saturations were monitored continuously.  Under direct visualization, the Pentax Linear [id number]  endoscope was introduced through the mouth  and advanced to the esophagus upper .  Water was used as necessary to provide an acoustic interface.  Upon completion of the imaging, water was removed and the patient was sent to the recovery room in satisfactory condition.  Endoscopic findings: 1. Clearly malignant, circumferential mass in the esophagus. The proximal edge of this mass is located 26cm from the incisors.  I could not advance the large diameter echoendoscope through the malignant stircture and so could not assess much more than just to proximal aspect of the tumor.  EUS findings (incomplete examination, see above): 1.  I could only examine the proximal aspect of the cancer due to the fairly snug malignant stricture (lumen 6-90mm across). The observed tumor invades into the muscularis propria but not clearly through that layer (uT2) 2.  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 I could not advance the echoendoscope through this stricture and so could only examine the proximal aspect of the tumor.  Proximal edge of mass is at 26cm from the incisors and the limited EUS evaluation shows uT2Nx invasion. Given the limitations described above, this may be an underestimate of the true staging.  RECOMMENDATIONS: I will communicate these results with Drs. Kefalas, [place] and Fields.  _______________________________ eSignedMilus Banister, MD 03/05/2015 12:50 PM

## 2015-03-06 ENCOUNTER — Encounter (HOSPITAL_BASED_OUTPATIENT_CLINIC_OR_DEPARTMENT_OTHER): Payer: Medicare Other | Admitting: Hematology & Oncology

## 2015-03-06 ENCOUNTER — Encounter (HOSPITAL_COMMUNITY): Payer: Self-pay | Admitting: Gastroenterology

## 2015-03-06 ENCOUNTER — Encounter (HOSPITAL_COMMUNITY): Payer: Self-pay | Admitting: Lab

## 2015-03-06 VITALS — BP 153/74 | HR 71 | Temp 98.3°F | Resp 18 | Wt 205.2 lb

## 2015-03-06 DIAGNOSIS — I639 Cerebral infarction, unspecified: Secondary | ICD-10-CM

## 2015-03-06 DIAGNOSIS — C159 Malignant neoplasm of esophagus, unspecified: Secondary | ICD-10-CM

## 2015-03-06 DIAGNOSIS — C154 Malignant neoplasm of middle third of esophagus: Secondary | ICD-10-CM | POA: Diagnosis not present

## 2015-03-06 DIAGNOSIS — R634 Abnormal weight loss: Secondary | ICD-10-CM | POA: Diagnosis not present

## 2015-03-06 NOTE — Progress Notes (Signed)
Referral sent to [facility]. Records faxed on 2/24

## 2015-03-06 NOTE — Patient Instructions (Addendum)
[place] at [facility] Discharge Instructions  RECOMMENDATIONS MADE BY THE CONSULTANT AND ANY TEST RESULTS WILL BE SENT TO YOUR REFERRING PHYSICIAN.   Exam and discussion by Dr Whitney Muse today Referral to radiation in [place], they will call with this appt Need to get a port-a-cath placed Need to get a feeding tube placed, these could be done the same day.  Hildred Alamin will be getting in contact with your to do teaching on your chemotherapy drugs Return to see the doctor 1st day of chemotherapy Please call the clinic if you have any questions or concerns     Thank you for choosing [place] at [facility] to provide your oncology and hematology care.  To afford each patient quality time with our provider, please arrive at least 15 minutes before your scheduled appointment time.   Beginning January 23rd 2017 lab work for the [facility] will be done in the  Main lab at [facility] on 1st floor. If you have a lab appointment with the [place] please come in thru the  Main Entrance and check in at the main information desk  You need to re-schedule your appointment should you arrive 10 or more minutes late.  We strive to give you quality time with our providers, and arriving late affects you and other patients whose appointments are after yours.  Also, if you no show three or more times for appointments you may be dismissed from the clinic at the providers discretion.     Again, thank you for choosing [facility].  Our hope is that these requests will decrease the amount of time that you wait before being seen by our physicians.       _____________________________________________________________  Should you have questions after your visit to [facility], please contact our office at [phone] between the hours of 8:30 a.m. and 4:30 p.m.  Voicemails left after 4:30 p.m. will not be returned until the following business  day.  For prescription refill requests, have your pharmacy contact our office.

## 2015-03-06 NOTE — Progress Notes (Signed)
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 male is here because of newly diagnosed squamous cell carcinoma of the esophagus.  He also has a sigmoid polyp with high grade dysplasia (Dr. Oneida Alar believes she removed it all). 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 He is still able to ambulate with the assistance of a walker or a cane. He lives at [facility] facility with his wife.   Billy Carson returns to the [place] alone today. In general, he says "the sooner, the better." When asked if he's scared, he says "No, I'm not scared."  The patient was advised that we are going to treat his cancer, and that this is not going to be easy  When asked whether he's had difficulty swallowing recently, he says that he's had a little bit of trouble, but that he's doing pretty well.  He confirms that he is eating. He notes that he is constipated, and that "they are supposed to be giving me medicine over there." He denies choking on anything. He denies regurgitation of food. No vomiting.    MEDICAL HISTORY:  Past Medical History  Diagnosis Date  . Hypertension   . Hyperlipidemia   . Stroke [facility])     left sided weakness  . Squamous cell carcinoma  of esophagus ([place]) 02/26/2015    SURGICAL HISTORY: Past Surgical History  Procedure Laterality Date  . Tonsillectomy    . Cataract extraction w/phaco Left 10/14/2013    Procedure: CATARACT EXTRACTION PHACO AND INTRAOCULAR LENS PLACEMENT LEFT EYE CDE=11.04;  Surgeon: Williams Che, MD;  Location: [facility];  Service: Ophthalmology;  Laterality: Left;  . Cataract extraction w/phaco Right 12/30/2013    Procedure: CATARACT EXTRACTION PHACO AND INTRAOCULAR LENS PLACEMENT; CDE:  110.53;  Surgeon: Williams Che, MD;  Location: [facility];  Service: Ophthalmology;  Laterality: Right;  . Colonoscopy with propofol N/A 02/10/2015    Procedure: COLONOSCOPY WITH PROPOFOL;  Surgeon: Danie Binder, MD;  Location: [facility];  Service: Endoscopy;  Laterality: N/A;  1030  . Esophagogastroduodenoscopy (egd) with propofol N/A 02/10/2015    Procedure: ESOPHAGOGASTRODUODENOSCOPY (EGD) WITH PROPOFOL;  Surgeon: Danie Binder, MD;  Location: [facility];  Service: Endoscopy;  Laterality: N/A;  . Polypectomy N/A 02/10/2015    Procedure: POLYPECTOMY;  Surgeon: Danie Binder, MD;  Location: [facility];  Service: Endoscopy;  Laterality: N/A;  cecum x 1 and ascending colon polyp x1, Medium sigmoid colon polyp, Medium and Large sigmoid colon polyps  . Biopsy N/A 02/10/2015    Procedure: BIOPSY;  Surgeon: Danie Binder, MD;  Location: [facility];  Service: Endoscopy;  Laterality: N/A;  Esophageal mass biopsies, Gastric biopsies  . Eus N/A 03/05/2015    Procedure: UPPER ENDOSCOPIC ULTRASOUND (EUS) RADIAL;  Surgeon: Milus Banister, MD;  Location: [facility];  Service: Endoscopy;  Laterality: N/A;    SOCIAL HISTORY: Social History   Social History  . Marital Status: Married    Spouse Name: N/A  . Number of Children: N/A  . Years of Education: N/A   Occupational History  . Not on file.   Social History Main Topics  . Smoking status: Former Smoker -- 1.00 packs/day for 20 years    Types: Cigarettes     Quit date: 10/03/2012  . Smokeless tobacco: Never Used  . Alcohol Use: No     Comment: Denies current ETOH since 12/2013; Previously 1 pint per day per pt 12/24/2013 x 8-10 years.  . Drug Use: No  . Sexual Activity: No   Other Topics Concern  . 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 Colon cancer Neg Hx    has no family status information on file.   Father deceased at 10 yo of a stroke Mother deceased, not sure of the cause 4 brothers and 5 sisters Only 1 sister still living. 1 brother died of throat cancer  ALLERGIES:  has No Known Allergies.  MEDICATIONS:  Current Outpatient Prescriptions  Medication Sig Dispense Refill  . atorvastatin (LIPITOR) 10 MG tablet Take 10 mg by mouth daily at 6 PM.     . cyanocobalamin (,VITAMIN B-12,) 1000 MCG/ML injection Inject 1,000 mcg into the muscle every 30 (thirty) days.    . ENSURE PLUS (ENSURE PLUS) LIQD Take 237 mLs by mouth 2 (two) times daily.    . folic acid (FOLVITE) Q000111Q MCG tablet Take 400 mcg by mouth daily.    . hydrALAZINE (APRESOLINE) 10 MG tablet Take 1 tablet (10 mg total) by mouth every 8 (eight) hours. 90 tablet 1  . lisinopril (PRINIVIL,ZESTRIL) 10 MG tablet Take 10 mg by mouth daily.    . metoprolol tartrate (LOPRESSOR) 25 MG tablet Take 12.5 mg by mouth 2 (two) times daily.    . Multiple Vitamin (MULTIVITAMIN WITH MINERALS) TABS tablet Take 1 tablet by mouth daily. 30 tablet 1  . promethazine (PHENERGAN) 25 MG tablet Take 25 mg by mouth every 6 (six) hours as needed for nausea or vomiting.    Marland Kitchen PROMETHAZINE HCL IJ Inject 12.5 mg as directed every 6 (six) hours as needed (Nausea and vomitting).    Marland Kitchen senna-docusate (SENOKOT-S) 8.6-50 MG per tablet Take 1 tablet by mouth daily.    . Vitamin D, Ergocalciferol, (DRISDOL) 50000 units CAPS  capsule Take 50,000 Units by mouth every 30 (thirty) days.     No current facility-administered medications for this visit.    Review of Systems  Constitutional: Positive for weight loss.  HENT: Negative.        Trouble swallowing.  Eyes: Negative.   Respiratory: Negative.   Cardiovascular: Negative.   Gastrointestinal: Positive for blood in stool.  Genitourinary: Negative.   Musculoskeletal: Negative.   Skin: Negative.   Neurological: Positive for sensory change, speech change, focal weakness and weakness.       Left sided weakness associated with prior stroke.  Endo/Heme/Allergies: Negative.   Psychiatric/Behavioral: Negative.   All other systems reviewed and are negative.  14 point ROS was done and is otherwise as detailed above or in HPI    PHYSICAL EXAMINATION: ECOG PERFORMANCE STATUS: 2 - Symptomatic, <50% confined  to bed   Filed Vitals:   03/06/15 1205  BP: 153/74  Pulse: 71  Temp: 98.3 F (36.8 C)  Resp: 18   Filed Weights   03/06/15 1205  Weight: 205 lb 3.2 oz (93.078 kg)    Physical Exam  Constitutional: He is oriented to person, place, and time and well-developed, well-nourished, and in no distress.  In wheelchair. Wears glasses. Able to get on exam table with assistance.  HENT:  Head: Normocephalic and atraumatic.  Mouth/Throat: Oropharynx is clear and moist.  Eyes: Conjunctivae and EOM are normal. Pupils are equal, round, and reactive to light. Right eye exhibits no discharge. Left eye exhibits no discharge.  Right pupil deformity noted.  Neck: Normal range of motion. Neck supple. No JVD present.  Cardiovascular: Normal rate and regular rhythm.   Pulmonary/Chest: Effort normal and breath sounds normal.  Abdominal: Soft. Bowel sounds are normal. He exhibits no distension and no mass. There is no tenderness. There is no rebound and no guarding.  Musculoskeletal: Normal range of motion. He exhibits no edema or tenderness.  Lymphadenopathy:    He has  no cervical adenopathy.  Neurological: He is alert and oriented to person, place, and time. No cranial nerve deficit. He exhibits abnormal muscle tone. Coordination abnormal.  Left sided weakness in both upper and lower extremities. Limited grip strength in the left hand.  Skin: Skin is warm and dry.  Psychiatric: Memory normal.  Nursing note and vitals reviewed.   LABORATORY DATA:  I have reviewed the data as listed Lab Results  Component Value Date   WBC 7.9 01/16/2015   HGB 12.9* 01/16/2015   HCT 38.8* 01/16/2015   MCV 95.1 01/16/2015   PLT 218 01/16/2015   CMP     Component Value Date/Time   NA 139 01/16/2015 1330   K 4.3 01/16/2015 1330   CL 105 01/16/2015 1330   CO2 26 01/16/2015 1330   GLUCOSE 109* 01/16/2015 1330   BUN 18 01/16/2015 1330   CREATININE 1.36* 01/16/2015 1330   CALCIUM 9.5 01/16/2015 1330   GFRNONAA 51* 01/16/2015 1330   GFRAA 59* 01/16/2015 1330    PATHOLOGY:  PATHOLOGY:   RADIOGRAPHIC STUDIES: I have personally reviewed the radiological images as listed and agreed with the findings in the report.   02/25/2015 Study Result     CLINICAL DATA: Initial treatment strategy for squamous cell esophageal carcinoma. Initial diagnosis by endoscopy 02/10/2015  EXAM: NUCLEAR MEDICINE PET SKULL BASE TO THIGH  TECHNIQUE: For 9.9 mCi F-18 FDG was injected intravenously. Full-ring PET imaging was performed from the skull base to thigh after the radiotracer. CT data was obtained and used for attenuation correction and anatomic localization.  FASTING BLOOD GLUCOSE: Value: 99 mg/dl  COMPARISON: MR brain from 09/06/2012  FINDINGS: NECK  No hypermetabolic lymph nodes in the neck. Mild chronic right maxillary sinusitis.  CHEST  Abnormal mid thoracic esophageal region of hypermetabolism extends over a 6 cm vertical excursion, with maximum standard uptake value of 19.1. There is a small metal clip or fiducial along mass hypermetabolic  region. No other hypermetabolic activity in the chest.  Linear subsegmental atelectasis or scarring in the posterior basal segment right lower lobe. Pleural calcification along the left hemidiaphragm, significance uncertain.  ABDOMEN/PELVIS  Faintly high activity in both adrenal glands without visible adrenal mass, right adrenal gland maximum SUV 6.8 and left adrenal gland 7.2.  Scattered physiologic activity in the bowel. Prominent prostate gland.  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 I am skeptical of osseous metastatic disease given the overall pattern and the very faint increased metabolic activity.  IMPRESSION: 1. Esophageal mass extends longitudinally over about 6 cm and is associated with very hypermetabolic activity. No definite separable adenopathy in the chest or other metastatic disease identified. 2. 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 3. Faintly increased activity in both adrenal glands without adrenal mass, likely physiologic/incidental. 4. Prominent prostate gland. 5. Mild chronic right maxillary sinusitis.   Electronically Signed  By: Van Clines M.D.  On: 02/25/2015 12:51     CLINICAL DATA: 70 year old male with left-sided weakness. Inability to move left extremities. Stroke.  EXAM: MRI HEAD WITHOUT CONTRAST  MRA HEAD WITHOUT CONTRAST  TECHNIQUE: Multiplanar, multiecho pulse sequences of the brain and surrounding structures were obtained without intravenous contrast. Angiographic images of the head were obtained using MRA technique without contrast.  COMPARISON: Head  CT without contrast 09/06/2012.  FINDINGS: MRI HEAD FINDINGS  Confluent restricted diffusion in the posterior right lentiform nuclei probably also affecting the posterior limb of the right internal and external capsules. This infarct does extend toward the right Corona radiata.  In addition, there are punctate areas of restricted diffusion in the left posterior hemisphere along the parieto-occipital sulcus (series 3, image 39) and also in the right occipital lobe.  No associated mass effect or hemorrhage with these findings. Major intracranial vascular flow voids are preserved.  Underlying numerous chronic lacunar infarcts in the bilateral deep gray matter nuclei, cerebellar hemispheres, and patchy and confluent bilateral cerebral white matter T2 and FLAIR hyperintensity. There is a chronic micro hemorrhage in the left posterior temporal lobe (series 13, image 13).  Ventricular size and configuration are within normal limits. #2 No midline shift, mass effect, or evidence of intracranial mass lesion. Negative pituitary, cervicomedullary junction visualized cervical spine. Normal bone marrow signal.  Visualized orbit soft tissues are within normal limits. Minor paranasal sinus mucosal thickening. Mastoids are clear. Negative scalp soft tissues.  MRA HEAD FINDINGS  Antegrade flow in the posterior circulation. Codominant distal vertebral arteries. Patent left PICA origin. Patent vertebrobasilar junction. No basilar artery stenosis. SCA and PCA origins are patent. Left posterior communicating artery is present, the right is diminutive or absent. There is mild focal irregularity of the right PCA P1 segment (series 106, image 26). Otherwise the bilateral PCA branches are within normal limits.  Antegrade flow in both ICA siphons. No ICA stenosis. Ophthalmic and left posterior communicating artery origins are within normal limits. Small infundibula at the right ICA  terminus suspected.  Carotid termini are patent with normal MCA and ACA origins. Anterior communicating artery and proximal ACA branches are within normal limits. Questionable moderate irregularity and stenosis of the bilateral ACA is at the callosum marginal artery level. Bilateral MCA M1 segments are patent without stenosis. Both MCA bifurcations are patent. Visualized bilateral MCA branches are patent with mild branch irregularity.  IMPRESSION: MRI HEAD IMPRESSION  1. Acute lacunar infarct of the right lentiform nuclei, and likely affecting the posterior limbs of the right internal and external capsules. No associated mass effect or hemorrhage.  2. Underlying advanced chronic small vessel ischemia, such that punctate acute infarcts also noted in the bilateral PCA territories probably reflect synchronous small vessel disease.  MRA HEAD IMPRESSION  1. Mild irregularity of the right PCA P1 segment which could be related to the acute MRI findings on the basis of right thalamus striate artery origin involvement.  2. Otherwise mild intracranial atherosclerosis. Questionable moderate stenoses of the ACA callosomarginal arteries, versus artifact.   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 If Rad Onc feels surgical referral is appropriate we can get him to [place] to Dr. Verlene Mayer. I strongly feel he will need a FT placed  given his NH residence and I do not feel he is particularly forthcoming regarding his eating habits. We can discuss this with Dr. Verlene Mayer if needed.   He is more robust than anticipated and given his status I feel he may be a reasonable candidate for concurrent therapy. We will also get a port placed. Billy Carson will need teaching as well, we will need to ensure that his daughter is present.   All questions were answered. The patient knows to call the clinic with any problems, questions or concerns.  This document serves as a record of services personally performed by Ancil Linsey, MD. It was created on her behalf by Toni Amend, a trained medical scribe. The creation of this record is based on the scribe's personal observations and the provider's statements to them. This document has been checked and approved by the attending provider.  I have reviewed the above documentation for accuracy and completeness, and I agree with the above.  This note was electronically signed.    Molli Hazard, MD  03/06/2015 12:35 PM

## 2015-03-10 ENCOUNTER — Other Ambulatory Visit: Payer: Self-pay | Admitting: Radiology

## 2015-03-10 ENCOUNTER — Other Ambulatory Visit: Payer: Self-pay | Admitting: General Surgery

## 2015-03-11 ENCOUNTER — Other Ambulatory Visit (HOSPITAL_COMMUNITY): Payer: Self-pay | Admitting: Oncology

## 2015-03-11 ENCOUNTER — Ambulatory Visit (HOSPITAL_COMMUNITY)
Admission: RE | Admit: 2015-03-11 | Discharge: 2015-03-11 | Disposition: A | Discharge status: Home / Self Care | Payer: Medicare Other | Source: Ambulatory Visit | Attending: Hematology & Oncology | Admitting: Hematology & Oncology

## 2015-03-11 ENCOUNTER — Ambulatory Visit (HOSPITAL_COMMUNITY)
Admission: RE | Admit: 2015-03-11 | Discharge: 2015-03-11 | Disposition: A | Discharge status: Home / Self Care | Payer: Medicare Other | Source: Ambulatory Visit | Attending: Oncology | Admitting: Oncology

## 2015-03-11 DIAGNOSIS — C159 Malignant neoplasm of esophagus, unspecified: Secondary | ICD-10-CM | POA: Diagnosis present

## 2015-03-11 DIAGNOSIS — I69354 Hemiplegia and hemiparesis following cerebral infarction affecting left non-dominant side: Secondary | ICD-10-CM | POA: Diagnosis not present

## 2015-03-11 DIAGNOSIS — I10 Essential (primary) hypertension: Secondary | ICD-10-CM | POA: Diagnosis not present

## 2015-03-11 DIAGNOSIS — Z87891 Personal history of nicotine dependence: Secondary | ICD-10-CM | POA: Diagnosis not present

## 2015-03-11 DIAGNOSIS — E785 Hyperlipidemia, unspecified: Secondary | ICD-10-CM | POA: Diagnosis not present

## 2015-03-11 HISTORY — PX: PORTACATH PLACEMENT: SHX2246

## 2015-03-11 LAB — GLUCOSE, CAPILLARY
Glucose-Capillary: 108 mg/dL — ABNORMAL HIGH (ref 65–99)
Glucose-Capillary: 84 mg/dL (ref 65–99)

## 2015-03-11 LAB — CBC
HCT: 37.7 % — ABNORMAL LOW (ref 39.0–52.0)
HEMOGLOBIN: 12.4 g/dL — AB (ref 13.0–17.0)
MCH: 31.5 pg (ref 26.0–34.0)
MCHC: 32.9 g/dL (ref 30.0–36.0)
MCV: 95.7 fL (ref 78.0–100.0)
Platelets: 207 10*3/uL (ref 150–400)
RBC: 3.94 MIL/uL — ABNORMAL LOW (ref 4.22–5.81)
RDW: 12.8 % (ref 11.5–15.5)
WBC: 7.8 10*3/uL (ref 4.0–10.5)

## 2015-03-11 LAB — PROTIME-INR
INR: 1.04 (ref 0.00–1.49)
PROTHROMBIN TIME: 13.8 s (ref 11.6–15.2)

## 2015-03-11 LAB — APTT: APTT: 27 s (ref 24–37)

## 2015-03-11 MED ORDER — LIDOCAINE HCL 1 % IJ SOLN
INTRAMUSCULAR | Status: AC
  Filled 2015-03-11: qty 20

## 2015-03-11 MED ORDER — SODIUM CHLORIDE 0.9 % IV SOLN
INTRAVENOUS | Status: DC
  Administered 2015-03-11: 10:00:00 via INTRAVENOUS

## 2015-03-11 MED ORDER — HEPARIN SOD (PORK) LOCK FLUSH 100 UNIT/ML IV SOLN
INTRAVENOUS | Status: AC
  Filled 2015-03-11: qty 5

## 2015-03-11 MED ORDER — HEPARIN SOD (PORK) LOCK FLUSH 100 UNIT/ML IV SOLN
INTRAVENOUS | Status: AC | PRN
  Administered 2015-03-11: 500 [IU]

## 2015-03-11 MED ORDER — LIDOCAINE-EPINEPHRINE 2 %-1:100000 IJ SOLN
INTRAMUSCULAR | Status: AC | PRN
  Administered 2015-03-11: 20 mL

## 2015-03-11 MED ORDER — MIDAZOLAM HCL 2 MG/2ML IJ SOLN
INTRAMUSCULAR | Status: AC
  Filled 2015-03-11: qty 6

## 2015-03-11 MED ORDER — MIDAZOLAM HCL 2 MG/2ML IJ SOLN
INTRAMUSCULAR | Status: AC | PRN
  Administered 2015-03-11 (×2): 1 mg via INTRAVENOUS

## 2015-03-11 MED ORDER — FENTANYL CITRATE (PF) 100 MCG/2ML IJ SOLN
INTRAMUSCULAR | Status: AC
  Filled 2015-03-11: qty 4

## 2015-03-11 MED ORDER — CEFAZOLIN SODIUM-DEXTROSE 2-3 GM-% IV SOLR
INTRAVENOUS | Status: AC
  Administered 2015-03-11: 2 g via INTRAVENOUS
  Filled 2015-03-11: qty 50

## 2015-03-11 MED ORDER — CEFAZOLIN SODIUM-DEXTROSE 2-3 GM-% IV SOLR
2.0000 g | INTRAVENOUS | Status: AC
  Administered 2015-03-11: 2 g via INTRAVENOUS

## 2015-03-11 MED ORDER — FENTANYL CITRATE (PF) 100 MCG/2ML IJ SOLN
INTRAMUSCULAR | Status: AC | PRN
  Administered 2015-03-11: 50 ug via INTRAVENOUS

## 2015-03-11 NOTE — Progress Notes (Signed)
Pt had PAC insertion in IR today. Post insertion observation is complete and [facility] has been called for transport toback to [facility] [place] where he resides. Called report to April Reed RN at [facility]. Discharge instructions and PAC booklet sent back with patient.

## 2015-03-11 NOTE — Procedures (Signed)
R IJ Port cathter placement with US and fluoroscopy No complication No blood loss. See complete dictation in [facility] PACS.  

## 2015-03-11 NOTE — Discharge Instructions (Signed)

## 2015-03-11 NOTE — H&P (Signed)
Chief Complaint: Patient was seen in consultation today for Port-A-Cath placement  Referring Physician(s): Baird Cancer  Supervising Physician: Arne Cleveland  History of Present Illness: Billy Carson is a 70 y.o. male with past medical history of hypertension, hyperlipidemia, prior stroke with left-sided weakness and newly diagnosed squamous cell carcinoma of the esophagus who presents today for Port-A-Cath placement for chemotherapy.  Past Medical History  Diagnosis Date  . Hypertension   . Hyperlipidemia   . Stroke [facility])     left sided weakness  . Squamous cell carcinoma of esophagus ([place]) 02/26/2015    Past Surgical History  Procedure Laterality Date  . Tonsillectomy    . Cataract extraction w/phaco Left 10/14/2013    Procedure: CATARACT EXTRACTION PHACO AND INTRAOCULAR LENS PLACEMENT LEFT EYE CDE=11.04;  Surgeon: Williams Che, MD;  Location: [facility];  Service: Ophthalmology;  Laterality: Left;  . Cataract extraction w/phaco Right 12/30/2013    Procedure: CATARACT EXTRACTION PHACO AND INTRAOCULAR LENS PLACEMENT; CDE:  110.53;  Surgeon: Williams Che, MD;  Location: [facility];  Service: Ophthalmology;  Laterality: Right;  . Colonoscopy with propofol N/A 02/10/2015    Procedure: COLONOSCOPY WITH PROPOFOL;  Surgeon: Danie Binder, MD;  Location: [facility];  Service: Endoscopy;  Laterality: N/A;  1030  . Esophagogastroduodenoscopy (egd) with propofol N/A 02/10/2015    Procedure: ESOPHAGOGASTRODUODENOSCOPY (EGD) WITH PROPOFOL;  Surgeon: Danie Binder, MD;  Location: [facility];  Service: Endoscopy;  Laterality: N/A;  . Polypectomy N/A 02/10/2015    Procedure: POLYPECTOMY;  Surgeon: Danie Binder, MD;  Location: [facility];  Service: Endoscopy;  Laterality: N/A;  cecum x 1 and ascending colon polyp x1, Medium sigmoid colon polyp, Medium and Large sigmoid colon polyps  . Biopsy N/A 02/10/2015    Procedure: BIOPSY;  Surgeon: Danie Binder, MD;  Location:  [facility];  Service: Endoscopy;  Laterality: N/A;  Esophageal mass biopsies, Gastric biopsies  . Eus N/A 03/05/2015    Procedure: UPPER ENDOSCOPIC ULTRASOUND (EUS) RADIAL;  Surgeon: Milus Banister, MD;  Location: [facility];  Service: Endoscopy;  Laterality: N/A;    Allergies: Review of patient's allergies indicates no known allergies.  Medications: Prior to Admission medications   Medication Sig Start Date End Date Taking? Authorizing Provider  atorvastatin (LIPITOR) 10 MG tablet Take 10 mg by mouth daily at 6 PM.  11/26/14  Yes Historical Provider, MD  ENSURE PLUS (ENSURE PLUS) LIQD Take 237 mLs by mouth 2 (two) times daily.   Yes Historical Provider, MD  folic acid (FOLVITE) Q000111Q MCG tablet Take 400 mcg by mouth daily.   Yes Historical Provider, MD  lisinopril (PRINIVIL,ZESTRIL) 10 MG tablet Take 10 mg by mouth daily.   Yes Historical Provider, MD  metoprolol tartrate (LOPRESSOR) 25 MG tablet Take 12.5 mg by mouth 2 (two) times daily.   Yes Historical Provider, MD  Multiple Vitamin (MULTIVITAMIN WITH MINERALS) TABS tablet Take 1 tablet by mouth daily. 09/11/12  Yes Costin Karlyne Greenspan, MD  senna-docusate (SENOKOT-S) 8.6-50 MG per tablet Take 1 tablet by mouth daily.   Yes Historical Provider, MD  Vitamin D, Ergocalciferol, (DRISDOL) 50000 units CAPS capsule Take 50,000 Units by mouth every 30 (thirty) days.   Yes Historical Provider, MD  cyanocobalamin (,VITAMIN B-12,) 1000 MCG/ML injection Inject 1,000 mcg into the muscle every 30 (thirty) days.    Historical Provider, MD  hydrALAZINE (APRESOLINE) 10 MG tablet Take 1 tablet (10 mg total) by mouth every 8 (  eight) hours. 09/11/12   Costin Karlyne Greenspan, MD  promethazine (PHENERGAN) 25 MG tablet Take 25 mg by mouth every 6 (six) hours as needed for nausea or vomiting.    Historical Provider, MD  PROMETHAZINE HCL IJ Inject 12.5 mg as directed every 6 (six) hours as needed (Nausea and vomitting).    Historical Provider, MD     Family History    Problem Relation Age of Onset  . Colon cancer Neg Hx     Social History   Social History  . Marital Status: Married    Spouse Name: N/A  . Number of Children: N/A  . Years of Education: N/A   Social History Main Topics  . Smoking status: Former Smoker -- 1.00 packs/day for 20 years    Types: Cigarettes    Quit date: 10/03/2012  . Smokeless tobacco: Never Used  . Alcohol Use: No     Comment: Denies current ETOH since 12/2013; Previously 1 pint per day per pt 12/24/2013 x 8-10 years.  . Drug Use: No  . Sexual Activity: No   Other Topics Concern  . Not on file   Social History Narrative      Review of Systems  Constitutional: Positive for unexpected weight change. Negative for fever and chills.  HENT: Positive for trouble swallowing.   Respiratory: Negative for cough and shortness of breath.   Cardiovascular: Negative for chest pain.  Gastrointestinal: Negative for nausea, vomiting, abdominal pain and blood in stool.  Genitourinary: Negative for dysuria and hematuria.  Musculoskeletal: Negative for back pain.  Neurological: Positive for weakness. Negative for headaches.    Vital Signs: BP 132/72 mmHg  Pulse 66  Temp(Src) 98 F (36.7 C) (Oral)  Resp 18  Ht 6' (1.829 m)  Wt 205 lb (92.987 kg)  BMI 27.80 kg/m2  SpO2 99%  Physical Exam  Constitutional: He is oriented to person, place, and time. He appears well-developed and well-nourished.  Cardiovascular: Normal rate and regular rhythm.   Pulmonary/Chest: Effort normal.  Slightly diminished breath sounds at bases  Abdominal: Soft. Bowel sounds are normal. There is no tenderness.  Musculoskeletal:  Mild left sided weakness noted  Neurological: He is alert and oriented to person, place, and time.    Mallampati Score:     Imaging: Nm Pet Image Initial (pi) Skull Base To Thigh  02/25/2015  CLINICAL DATA:  Initial treatment strategy for squamous cell esophageal carcinoma. Initial diagnosis by endoscopy  02/10/2015 EXAM: NUCLEAR MEDICINE PET SKULL BASE TO THIGH TECHNIQUE: For 9.9 mCi F-18 FDG was injected intravenously. Full-ring PET imaging was performed from the skull base to thigh after the radiotracer. CT data was obtained and used for attenuation correction and anatomic localization. FASTING BLOOD GLUCOSE:  Value: 99 mg/dl COMPARISON:  MR brain from 09/06/2012 FINDINGS: NECK No hypermetabolic lymph nodes in the neck. Mild chronic right maxillary sinusitis. CHEST Abnormal mid thoracic esophageal region of hypermetabolism extends over a 6 cm vertical excursion, with maximum standard uptake value of 19.1. There is a small metal clip or fiducial along mass hypermetabolic region. No other hypermetabolic activity in the chest. Linear subsegmental atelectasis or scarring in the posterior basal segment right lower lobe. Pleural calcification along the left hemidiaphragm, significance uncertain. ABDOMEN/PELVIS Faintly high activity in both adrenal glands without visible adrenal mass, right adrenal gland maximum SUV 6.8 and left adrenal gland 7.2. Scattered physiologic activity in the bowel. Prominent prostate gland. 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 I am skeptical of osseous metastatic disease given the overall pattern and the very faint increased metabolic activity. IMPRESSION: 1. Esophageal mass extends longitudinally over about 6 cm and is associated with very hypermetabolic activity. No definite separable adenopathy in the chest or other metastatic disease identified. 2. 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 3. Faintly increased activity in both adrenal glands  without adrenal mass, likely physiologic/incidental. 4. Prominent prostate gland. 5. Mild chronic right maxillary sinusitis. Electronically Signed   By: Van Clines M.D.   On: 02/25/2015 12:51    Labs:  CBC:  Recent Labs  01/16/15 1330 03/11/15 1010  WBC 7.9 7.8  HGB 12.9* 12.4*  HCT 38.8* 37.7*  PLT 218 207    COAGS:  Recent Labs  03/11/15 1010  INR 1.04  APTT 27    BMP:  Recent Labs  01/16/15 1330  NA 139  K 4.3  CL 105  CO2 26  GLUCOSE 109*  BUN 18  CALCIUM 9.5  CREATININE 1.36*  GFRNONAA 51*  GFRAA 59*    LIVER FUNCTION TESTS: No results for input(s): BILITOT, AST, ALT, ALKPHOS, PROT, ALBUMIN in the last 8760 hours.  TUMOR MARKERS: No results for input(s): AFPTM, CEA, CA199, CHROMGRNA in the last 8760 hours.  Assessment and Plan:  70 y.o. male with past medical history of hypertension, hyperlipidemia, prior stroke with left-sided weakness and newly diagnosed squamous cell carcinoma of the esophagus who presents today for Port-A-Cath placement for chemotherapy.Risks and benefits discussed with the patient including, but not limited to bleeding, infection, pneumothorax, or fibrin sheath development and need for additional procedures.All of the patient's questions were answered, patient is agreeable to proceed.Consent signed and in chart.     Thank you for this interesting consult.  I greatly enjoyed meeting Billy Carson and look forward to participating in their care.  A copy of this report was sent to the requesting provider on this date.  Electronically Signed: D. Rowe Robert 03/11/2015, 11:22 AM   I spent a total of 15 minutes in face to face in clinical consultation, greater than 50% of which was counseling/coordinating care for port a cath placement

## 2015-03-17 ENCOUNTER — Telehealth (HOSPITAL_COMMUNITY): Payer: Self-pay | Admitting: *Deleted

## 2015-03-17 NOTE — Telephone Encounter (Signed)
I have tried to call Lattie Haw, pt's representative, two times but her vm is not set up to receive vms (stating it is full). I sent a SMS message with my # attached. I am hoping to hear back from her so that I can schedule [facility] for chemo teaching this week.

## 2015-03-18 ENCOUNTER — Other Ambulatory Visit (HOSPITAL_COMMUNITY): Payer: Self-pay | Admitting: Oncology

## 2015-03-18 DIAGNOSIS — C159 Malignant neoplasm of esophagus, unspecified: Secondary | ICD-10-CM

## 2015-03-18 NOTE — Patient Instructions (Signed)
[place]   CHEMOTHERAPY INSTRUCTIONS   Premeds: Zofran - for nausea/vomiting prevention/reduction. Dexamethasone - steroid - given to reduce the risk of you having an allergic type reaction to the Taxol chemotherapy. Dex can cause you to feel energized, nervous/anxious/jittery, make you have trouble sleeping, and/or make you feel hot/flushed in the face/neck and/or look pink/red in the face/neck. These side effects will pass as the Dex wears off. Benadryl- anti-histamine - given to reduce the risk of you having an allergic reaction to the Taxol chemo. Pepcid - anti-histamine - given to reduce the risk of you having an allergic reaction to the Taxol chemo. (takes 1 hour to infuse)  Taxol - the first time you receive this drug we will titrate it very slowly to ensure that you do not have or are not having an allergic reaction to the chemo. Side Effects: hair loss, lowers your white blood cells (fight infection), muscle aches, nausea/vomiting, irritation to the mouth (mouth sores, pain in your mouth) *neuropathy - numbness/tingling/burning in hands/fingers/feet/toes. We need to know as soon as this begins to happen so that we can monitor it and treat if necessary. The numbness generally begins in the fingertips of tips of toes and then begins to travel up the finger/toe/hand/foot. We never want you getting to where you can't pick up a pen, coin, zip a zipper, button a button, or have trouble walking. You must tell us immediately if you are experiencing peripheral neuropathy! (the first time you receive this, it will take approximately 3 hours, after that it will only take 1 hour)   Carboplatin - this medication can be hard on your kidneys - this is why we need you drinking 64 oz of fluid (preferably water/decaff fluids) 2 days prior to chemo and for up to 4-5 days after chemo. Drink more if you can. This will help to keep your kidneys flushed. This can cause mild hair  loss, lower your platelets (which keep you from bleeding out when you cut yourself), lower your white blood cells (fight infection), and cause nausea/vomiting. (takes 30 minutes to infuse)    POTENTIAL SIDE EFFECTS OF TREATMENT: Increased Susceptibility to Infection, Vomiting, Constipation, Hair Thinning, Changes in Character of Skin and Nails (brittleness, dryness,etc.), Bone Marrow Suppression, Abdominal Cramping, Complete Hair Loss, Nausea, Diarrhea, Sun Sensitivity and Mouth Sores    EDUCATIONAL MATERIALS GIVEN AND REVIEWED: Chemotherapy and You booklet Specific Instructions Sheets: Carboplatin, Taxol, Zofran, Dexamethasone, Pepcid, Benadryl, EMLA cream, Zofran tablets, Compazine tablets   SELF CARE ACTIVITIES WHILE ON CHEMOTHERAPY: Increase your fluid intake 48 hours prior to treatment and drink at least 2 quarts per day after treatment., No alcohol intake., No aspirin or other medications unless approved by your oncologist., Eat foods that are light and easy to digest., Eat foods at cold or room temperature., No fried, fatty, or spicy foods immediately before or after treatment., Have teeth cleaned professionally before starting treatment. Keep dentures and partial plates clean., Use soft toothbrush and do not use mouthwashes that contain alcohol. Biotene is a good mouthwash that is available at most pharmacies or may be ordered by calling [phone]., Use warm salt water gargles (1 teaspoon salt per 1 quart warm water) before and after meals and at bedtime. Or you may rinse with 2 tablespoons of three -percent hydrogen peroxide mixed in eight ounces of water., Always use sunscreen with SPF (Sun Protection Factor) of 30 or higher., Use your nausea medication as directed to prevent nausea., Use  your stool softener or laxative as directed to prevent constipation. and Use your anti-diarrheal medication as directed to stop diarrhea.   Please wash your hands for at least 30 seconds using warm  soapy water. Handwashing is the #1 way to prevent the spread of germs. Stay away from sick people or people who are getting over a cold. If you develop respiratory systems such as green/yellow mucus production or productive cough or persistent cough let us know and we will see if you need an antibiotic. It is a good idea to keep a pair of gloves on when going into grocery stores/[employer] to decrease your risk of coming into contact with germs on the carts, etc. Carry alcohol hand gel with you at all times and use it frequently if out in public. All foods need to be cooked thoroughly. No raw foods. No medium or undercooked meats, eggs. If your food is cooked medium well, it does not need to be hot pink or saturated with bloody liquid at all. Vegetables and fruits need to be washed/rinsed under the faucet with a dish detergent before being consumed. You can eat raw fruits and vegetables unless we tell you otherwise but it would be best if you cooked them or bought frozen. Do not eat off of salad bars or hot bars unless you really trust the cleanliness of the restaurant. If you need dental work, please let Dr. Whitney Muse know before you go for your appointment so that we can coordinate the best possible time for you in regards to your chemo regimen. You need to also let your dentist know that you are actively taking chemo. We may need to do labs prior to your dental appointment. We also want your bowels moving at least every other day. If this is not happening, we need to know so that we can get you on a bowel regimen to help you go. If you are going to have sex, you will need to use a condom. This is to protect your partner from potential chemotherapy exposure. You will need to continue this for 28 days post completion of chemo.   MEDICATIONS: You have been given prescriptions for the following medications:  Zofran/Ondansetron 8mg  tablet. Take 1 tablet every 8 hours as needed for nausea/vomiting. (#1 nausea med to  take, this can constipate)  Compazine/Prochlorperazine 10mg  tablet. Take 1 tablet every 6 hours as needed for nausea/vomiting. (#2 nausea med to take, this can make you sleepy)  EMLA cream. Apply a quarter size amount to port site 1 hour prior to chemo. Do not rub in. Cover with plastic wrap.   Over-the-Counter Meds:  Miralax 1 capful in 8 oz of fluid daily. May increase to two times a day if needed. This is a stool softener. If this doesn't work proceed you can add:  Senokot S - start with 1 tablet two times a day and increase to 4 tablets two times a day if needed. (total of 8 tablets in a 24 hour period). This is a stimulant laxative.   Call us if this does not help your bowels move.   Imodium 2mg  capsule. Take 2 capsules after the 1st loose stool and then 1 capsule every 2 hours until you go a total of 12 hours without having a loose stool. Call the [facility] if loose stools continue. If diarrhea occurs @ bedtime, take 2 capsules @ bedtime. Then take 2 capsules every 4 hours until morning. Call [facility].   SYMPTOMS TO REPORT AS  SOON AS POSSIBLE AFTER TREATMENT:  FEVER GREATER THAN 100.5 F  CHILLS WITH OR WITHOUT FEVER  NAUSEA AND VOMITING THAT IS NOT CONTROLLED WITH YOUR NAUSEA MEDICATION  UNUSUAL SHORTNESS OF BREATH  UNUSUAL BRUISING OR BLEEDING  TENDERNESS IN MOUTH AND THROAT WITH OR WITHOUT PRESENCE OF ULCERS  URINARY PROBLEMS  BOWEL PROBLEMS  UNUSUAL RASH    Wear comfortable clothing and clothing appropriate for easy access to any Portacath or PICC line. Let us know if there is anything that we can do to make your therapy better!      I have been informed and understand all of the instructions given to me and have received a copy. I have been instructed to call the clinic [phone] or my family physician as soon as possible for continued medical care, if indicated. I do not have any more questions at this time but understand that I may call the  [facility] or the Patient Navigator at [phone] during office hours should I have questions or need assistance in obtaining follow-up care.           Paclitaxel injection What is this medicine? PACLITAXEL (PAK li TAX el) is a chemotherapy drug. It targets fast dividing cells, like cancer cells, and causes these cells to die. This medicine is used to treat ovarian cancer, breast cancer, and other cancers. This medicine may be used for other purposes; ask your health care provider or pharmacist if you have questions. What should I tell my health care provider before I take this medicine? They need to know if you have any of these conditions: -blood disorders -irregular heartbeat -infection (especially a virus infection such as chickenpox, cold sores, or herpes) -liver disease -previous or ongoing radiation therapy -an unusual or allergic reaction to paclitaxel, alcohol, polyoxyethylated castor oil, other chemotherapy agents, other medicines, foods, dyes, or preservatives -pregnant or trying to get pregnant -breast-feeding How should I use this medicine? This drug is given as an infusion into a vein. It is administered in a hospital or clinic by a specially trained health care professional. Talk to your pediatrician regarding the use of this medicine in children. Special care may be needed. Overdosage: If you think you have taken too much of this medicine contact a poison control center or emergency room at once. NOTE: This medicine is only for you. Do not share this medicine with others. What if I miss a dose? It is important not to miss your dose. Call your doctor or health care professional if you are unable to keep an appointment. What may interact with this medicine? Do not take this medicine with any of the following medications: -disulfiram -metronidazole This medicine may also interact with the following  medications: -cyclosporine -diazepam -ketoconazole -medicines to increase blood counts like filgrastim, pegfilgrastim, sargramostim -other chemotherapy drugs like cisplatin, doxorubicin, epirubicin, etoposide, teniposide, vincristine -quinidine -testosterone -vaccines -verapamil Talk to your doctor or health care professional before taking any of these medicines: -acetaminophen -aspirin -ibuprofen -ketoprofen -naproxen This list may not describe all possible interactions. Give your health care provider a list of all the medicines, herbs, non-prescription drugs, or dietary supplements you use. Also tell them if you smoke, drink alcohol, or use illegal drugs. Some items may interact with your medicine. What should I watch for while using this medicine? Your condition will be monitored carefully while you are receiving this medicine. You will need important blood work done while you are taking this medicine. This drug may make you feel  generally unwell. This is not uncommon, as chemotherapy can affect healthy cells as well as cancer cells. Report any side effects. Continue your course of treatment even though you feel ill unless your doctor tells you to stop. This medicine can cause serious allergic reactions. To reduce your risk you will need to take other medicine(s) before treatment with this medicine. In some cases, you may be given additional medicines to help with side effects. Follow all directions for their use. Call your doctor or health care professional for advice if you get a fever, chills or sore throat, or other symptoms of a cold or flu. Do not treat yourself. This drug decreases your body's ability to fight infections. Try to avoid being around people who are sick. This medicine may increase your risk to bruise or bleed. Call your doctor or health care professional if you notice any unusual bleeding. Be careful brushing and flossing your teeth or using a toothpick because you may  get an infection or bleed more easily. If you have any dental work done, tell your dentist you are receiving this medicine. Avoid taking products that contain aspirin, acetaminophen, ibuprofen, naproxen, or ketoprofen unless instructed by your doctor. These medicines may hide a fever. Do not become pregnant while taking this medicine. Women should inform their doctor if they wish to become pregnant or think they might be pregnant. There is a potential for serious side effects to an unborn child. Talk to your health care professional or pharmacist for more information. Do not breast-feed an infant while taking this medicine. Men are advised not to father a child while receiving this medicine. This product may contain alcohol. Ask your pharmacist or healthcare provider if this medicine contains alcohol. Be sure to tell all healthcare providers you are taking this medicine. Certain medicines, like metronidazole and disulfiram, can cause an unpleasant reaction when taken with alcohol. The reaction includes flushing, headache, nausea, vomiting, sweating, and increased thirst. The reaction can last from 30 minutes to several hours. What side effects may I notice from receiving this medicine? Side effects that you should report to your doctor or health care professional as soon as possible: -allergic reactions like skin rash, itching or hives, swelling of the face, lips, or tongue -low blood counts - This drug may decrease the number of white blood cells, red blood cells and platelets. You may be at increased risk for infections and bleeding. -signs of infection - fever or chills, cough, sore throat, pain or difficulty passing urine -signs of decreased platelets or bleeding - bruising, pinpoint red spots on the skin, black, tarry stools, nosebleeds -signs of decreased red blood cells - unusually weak or tired, fainting spells, lightheadedness -breathing problems -chest pain -high or low blood pressure -mouth  sores -nausea and vomiting -pain, swelling, redness or irritation at the injection site -pain, tingling, numbness in the hands or feet -slow or irregular heartbeat -swelling of the ankle, feet, hands Side effects that usually do not require medical attention (report to your doctor or health care professional if they continue or are bothersome): -bone pain -complete hair loss including hair on your head, underarms, pubic hair, eyebrows, and eyelashes -changes in the color of fingernails -diarrhea -loosening of the fingernails -loss of appetite -muscle or joint pain -red flush to skin -sweating This list may not describe all possible side effects. Call your doctor for medical advice about side effects. You may report side effects to FDA at 1-800-FDA-1088. Where should I keep my medicine? This  drug is given in a hospital or clinic and will not be stored at home. NOTE: This sheet is a summary. It may not cover all possible information. If you have questions about this medicine, talk to your doctor, pharmacist, or health care provider.    2016, Elsevier/[employer] Standard. (2014-08-14 13:02:56) Carboplatin injection What is this medicine? CARBOPLATIN (KAR boe pla tin) is a chemotherapy drug. It targets fast dividing cells, like cancer cells, and causes these cells to die. This medicine is used to treat ovarian cancer and many other cancers. This medicine may be used for other purposes; ask your health care provider or pharmacist if you have questions. What should I tell my health care provider before I take this medicine? They need to know if you have any of these conditions: -blood disorders -hearing problems -kidney disease -recent or ongoing radiation therapy -an unusual or allergic reaction to carboplatin, cisplatin, other chemotherapy, other medicines, foods, dyes, or preservatives -pregnant or trying to get pregnant -breast-feeding How should I use this medicine? This drug is usually  given as an infusion into a vein. It is administered in a hospital or clinic by a specially trained health care professional. Talk to your pediatrician regarding the use of this medicine in children. Special care may be needed. Overdosage: If you think you have taken too much of this medicine contact a poison control center or emergency room at once. NOTE: This medicine is only for you. Do not share this medicine with others. What if I miss a dose? It is important not to miss a dose. Call your doctor or health care professional if you are unable to keep an appointment. What may interact with this medicine? -medicines for seizures -medicines to increase blood counts like filgrastim, pegfilgrastim, sargramostim -some antibiotics like amikacin, gentamicin, neomycin, streptomycin, tobramycin -vaccines Talk to your doctor or health care professional before taking any of these medicines: -acetaminophen -aspirin -ibuprofen -ketoprofen -naproxen This list may not describe all possible interactions. Give your health care provider a list of all the medicines, herbs, non-prescription drugs, or dietary supplements you use. Also tell them if you smoke, drink alcohol, or use illegal drugs. Some items may interact with your medicine. What should I watch for while using this medicine? Your condition will be monitored carefully while you are receiving this medicine. You will need important blood work done while you are taking this medicine. This drug may make you feel generally unwell. This is not uncommon, as chemotherapy can affect healthy cells as well as cancer cells. Report any side effects. Continue your course of treatment even though you feel ill unless your doctor tells you to stop. In some cases, you may be given additional medicines to help with side effects. Follow all directions for their use. Call your doctor or health care professional for advice if you get a fever, chills or sore throat, or  other symptoms of a cold or flu. Do not treat yourself. This drug decreases your body's ability to fight infections. Try to avoid being around people who are sick. This medicine may increase your risk to bruise or bleed. Call your doctor or health care professional if you notice any unusual bleeding. Be careful brushing and flossing your teeth or using a toothpick because you may get an infection or bleed more easily. If you have any dental work done, tell your dentist you are receiving this medicine. Avoid taking products that contain aspirin, acetaminophen, ibuprofen, naproxen, or ketoprofen unless instructed by your doctor. These  medicines may hide a fever. Do not become pregnant while taking this medicine. Women should inform their doctor if they wish to become pregnant or think they might be pregnant. There is a potential for serious side effects to an unborn child. Talk to your health care professional or pharmacist for more information. Do not breast-feed an infant while taking this medicine. What side effects may I notice from receiving this medicine? Side effects that you should report to your doctor or health care professional as soon as possible: -allergic reactions like skin rash, itching or hives, swelling of the face, lips, or tongue -signs of infection - fever or chills, cough, sore throat, pain or difficulty passing urine -signs of decreased platelets or bleeding - bruising, pinpoint red spots on the skin, black, tarry stools, nosebleeds -signs of decreased red blood cells - unusually weak or tired, fainting spells, lightheadedness -breathing problems -changes in hearing -changes in vision -chest pain -high blood pressure -low blood counts - This drug may decrease the number of white blood cells, red blood cells and platelets. You may be at increased risk for infections and bleeding. -nausea and vomiting -pain, swelling, redness or irritation at the injection site -pain, tingling,  numbness in the hands or feet -problems with balance, talking, walking -trouble passing urine or change in the amount of urine Side effects that usually do not require medical attention (report to your doctor or health care professional if they continue or are bothersome): -hair loss -loss of appetite -metallic taste in the mouth or changes in taste This list may not describe all possible side effects. Call your doctor for medical advice about side effects. You may report side effects to FDA at 1-800-FDA-1088. Where should I keep my medicine? This drug is given in a hospital or clinic and will not be stored at home. NOTE: This sheet is a summary. It may not cover all possible information. If you have questions about this medicine, talk to your doctor, pharmacist, or health care provider.    2016, [employer] Standard. (2007-04-03 14:38:05) Ondansetron injection What is this medicine? ONDANSETRON (on DAN se tron) is used to treat nausea and vomiting caused by chemotherapy. It is also used to prevent or treat nausea and vomiting after surgery. This medicine may be used for other purposes; ask your health care provider or pharmacist if you have questions. What should I tell my health care provider before I take this medicine? They need to know if you have any of these conditions: -heart disease -history of irregular heartbeat -liver disease -low levels of magnesium or potassium in the blood -an unusual or allergic reaction to ondansetron, granisetron, other medicines, foods, dyes, or preservatives -pregnant or trying to get pregnant -breast-feeding How should I use this medicine? This medicine is for infusion into a vein. It is given by a health care professional in a hospital or clinic setting. Talk to your pediatrician regarding the use of this medicine in children. Special care may be needed. Overdosage: If you think you have taken too much of this medicine contact a poison control  center or emergency room at once. NOTE: This medicine is only for you. Do not share this medicine with others. What if I miss a dose? This does not apply. What may interact with this medicine? Do not take this medicine with any of the following medications: -apomorphine -certain medicines for fungal infections like fluconazole, itraconazole, ketoconazole, posaconazole, voriconazole -cisapride -dofetilide -dronedarone -pimozide -thioridazine -ziprasidone This medicine may also interact with  the following medications: -carbamazepine -certain medicines for depression, anxiety, or psychotic disturbances -fentanyl -linezolid -MAOIs like Carbex, Eldepryl, Marplan, Nardil, and Parnate -methylene blue (injected into a vein) -other medicines that prolong the QT interval (cause an abnormal heart rhythm) -phenytoin -rifampicin -tramadol This list may not describe all possible interactions. Give your health care provider a list of all the medicines, herbs, non-prescription drugs, or dietary supplements you use. Also tell them if you smoke, drink alcohol, or use illegal drugs. Some items may interact with your medicine. What should I watch for while using this medicine? Your condition will be monitored carefully while you are receiving this medicine. What side effects may I notice from receiving this medicine? Side effects that you should report to your doctor or health care professional as soon as possible: -allergic reactions like skin rash, itching or hives, swelling of the face, lips, or tongue -breathing problems -confusion -dizziness -fast or irregular heartbeat -feeling faint or lightheaded, falls -fever and chills -loss of balance or coordination -seizures -sweating -swelling of the hands and feet -tightness in the chest -tremors -unusually weak or tired Side effects that usually do not require medical attention (report to your doctor or health care professional if they  continue or are bothersome): -constipation or diarrhea -headache This list may not describe all possible side effects. Call your doctor for medical advice about side effects. You may report side effects to FDA at 1-800-FDA-1088. Where should I keep my medicine? This drug is given in a hospital or clinic and will not be stored at home. NOTE: This sheet is a summary. It may not cover all possible information. If you have questions about this medicine, talk to your doctor, pharmacist, or health care provider.    2016, [employer] Standard. (2012-10-03 16:18:28) Dexamethasone injection What is this medicine? DEXAMETHASONE (dex a METH a sone) is a corticosteroid. It is used to treat inflammation of the skin, joints, lungs, and other organs. Common conditions treated include asthma, allergies, and arthritis. It is also used for other conditions, like blood disorders and diseases of the adrenal glands. This medicine may be used for other purposes; ask your health care provider or pharmacist if you have questions. What should I tell my health care provider before I take this medicine? They need to know if you have any of these conditions: -blood clotting problems -Cushing's syndrome -diabetes -glaucoma -heart problems or disease -high blood pressure -infection like herpes, measles, tuberculosis, or chickenpox -kidney disease -liver disease -mental problems -myasthenia gravis -osteoporosis -previous heart attack -seizures -stomach, ulcer or intestine disease including colitis and diverticulitis -thyroid problem -an unusual or allergic reaction to dexamethasone, corticosteroids, other medicines, lactose, foods, dyes, or preservatives -pregnant or trying to get pregnant -breast-feeding How should I use this medicine? This medicine is for injection into a muscle, joint, lesion, soft tissue, or vein. It is given by a health care professional in a hospital or clinic setting. Talk to your  pediatrician regarding the use of this medicine in children. Special care may be needed. Overdosage: If you think you have taken too much of this medicine contact a poison control center or emergency room at once. NOTE: This medicine is only for you. Do not share this medicine with others. What if I miss a dose? This may not apply. If you are having a series of injections over a prolonged period, try not to miss an appointment. Call your doctor or health care professional to reschedule if you are unable to keep an appointment.  What may interact with this medicine? Do not take this medicine with any of the following medications: -mifepristone, RU-486 -vaccines This medicine may also interact with the following medications: -amphotericin B -antibiotics like clarithromycin, erythromycin, and troleandomycin -aspirin and aspirin-like drugs -barbiturates like phenobarbital -carbamazepine -cholestyramine -cholinesterase inhibitors like donepezil, galantamine, rivastigmine, and tacrine -cyclosporine -digoxin -diuretics -ephedrine -male hormones, like estrogens or progestins and birth control pills -indinavir -isoniazid -ketoconazole -medicines for diabetes -medicines that improve muscle tone or strength for conditions like myasthenia gravis -NSAIDs, medicines for pain and inflammation, like ibuprofen or naproxen -phenytoin -rifampin -thalidomide -warfarin This list may not describe all possible interactions. Give your health care provider a list of all the medicines, herbs, non-prescription drugs, or dietary supplements you use. Also tell them if you smoke, drink alcohol, or use illegal drugs. Some items may interact with your medicine. What should I watch for while using this medicine? Your condition will be monitored carefully while you are receiving this medicine. If you are taking this medicine for a long time, carry an identification card with your name and address, the type and  dose of your medicine, and your doctor's name and address. This medicine may increase your risk of getting an infection. Stay away from people who are sick. Tell your doctor or health care professional if you are around anyone with measles or chickenpox. Talk to your health care provider before you get any vaccines that you take this medicine. If you are going to have surgery, tell your doctor or health care professional that you have taken this medicine within the last twelve months. Ask your doctor or health care professional about your diet. You may need to lower the amount of salt you eat. The medicine can increase your blood sugar. If you are a diabetic check with your doctor if you need help adjusting the dose of your diabetic medicine. What side effects may I notice from receiving this medicine? Side effects that you should report to your doctor or health care professional as soon as possible: -allergic reactions like skin rash, itching or hives, swelling of the face, lips, or tongue -black or tarry stools -change in the amount of urine -changes in vision -confusion, excitement, restlessness, a false sense of well-being -fever, sore throat, sneezing, cough, or other signs of infection, wounds that will not heal -hallucinations -increased thirst -mental depression, mood swings, mistaken feelings of self importance or of being mistreated -pain in hips, back, ribs, arms, shoulders, or legs -pain, redness, or irritation at the injection site -redness, blistering, peeling or loosening of the skin, including inside the mouth -rounding out of face -swelling of feet or lower legs -unusual bleeding or bruising -unusual tired or weak -wounds that do not heal Side effects that usually do not require medical attention (report to your doctor or health care professional if they continue or are bothersome): -diarrhea or constipation -change in taste -headache -nausea, vomiting -skin problems,  acne, thin and shiny skin -touble sleeping -unusual growth of hair on the face or body -weight gain This list may not describe all possible side effects. Call your doctor for medical advice about side effects. You may report side effects to FDA at 1-800-FDA-1088. Where should I keep my medicine? This drug is given in a hospital or clinic and will not be stored at home. NOTE: This sheet is a summary. It may not cover all possible information. If you have questions about this medicine, talk to your doctor, pharmacist, or health care provider.  2016, [employer]. (2007-04-19 14:04:12) Famotidine injection What is this medicine? FAMOTIDINE (fa MOE ti deen) is a type of antihistamine that blocks the release of stomach acid. It is used to treat stomach or intestinal ulcers. It can relieve ulcer pain and discomfort, and the heartburn from acid reflux. This medicine may be used for other purposes; ask your health care provider or pharmacist if you have questions. What should I tell my health care provider before I take this medicine? They need to know if you have any of these conditions: -kidney or liver disease -an unusual or allergic reaction to famotidine, other medicines, foods, dyes, or preservatives -pregnant or trying to get pregnant -breast-feeding How should I use this medicine? This medicine is for infusion into a vein. It is given by a health care professional in a hospital or clinic setting. Talk to your pediatrician regarding the use of this medicine in children. Special care may be needed. Overdosage: If you think you have taken too much of this medicine contact a poison control center or emergency room at once. NOTE: This medicine is only for you. Do not share this medicine with others. What if I miss a dose? This does not apply. What may interact with this medicine? -delavirdine -itraconazole -ketoconazole This list may not describe all possible interactions. Give  your health care provider a list of all the medicines, herbs, non-prescription drugs, or dietary supplements you use. Also tell them if you smoke, drink alcohol, or use illegal drugs. Some items may interact with your medicine. What should I watch for while using this medicine? Tell your doctor or health care professional if your condition does not start to get better or gets worse. Do not take with aspirin, ibuprofen, or other antiinflammatory medicines. These can aggravate your condition. Do not smoke cigarettes or drink alcohol. These increase irritation in your stomach and can increase the time it will take for ulcers to heal. Cigarettes and alcohol can also worsen acid reflux or heartburn. If you get black, tarry stools or vomit up what looks like coffee grounds, call your doctor or health care professional at once. You may have a bleeding ulcer. What side effects may I notice from receiving this medicine? Side effects that you should report to your doctor or health care professional as soon as possible: -allergic reactions like skin rash, itching or hives, swelling of the face, lips, or tongue -agitation, nervousness -confusion -hallucinations Side effects that usually do not require medical attention (report to your doctor or health care professional if they continue or are bothersome): -constipation -diarrhea -dizziness -headache This list may not describe all possible side effects. Call your doctor for medical advice about side effects. You may report side effects to FDA at 1-800-FDA-1088. Where should I keep my medicine? This medicine is given in a hospital or clinic. You will not be given this medicine to store at home. NOTE: This sheet is a summary. It may not cover all possible information. If you have questions about this medicine, talk to your doctor, pharmacist, or health care provider.    2016, [employer]. (2007-05-02 13:24:51) Diphenhydramine injection What is  this medicine? DIPHENHYDRAMINE (dye fen HYE dra meen) is an antihistamine. It is used to treat the symptoms of an allergic reaction and motion sickness. It is also used to treat Parkinson's disease. This medicine may be used for other purposes; ask your health care provider or pharmacist if you have questions. What should I tell my health care provider before  I take this medicine? They need to know if you have any of these conditions: -asthma or lung disease -glaucoma -high blood pressure or heart disease -liver disease -pain or difficulty passing urine -prostate trouble -ulcers or other stomach problems -an unusual or allergic reaction to diphenhydramine, antihistamines, other medicines foods, dyes, or preservatives -pregnant or trying to get pregnant -breast-feeding How should I use this medicine? This medicine is for injection into a vein or a muscle. It is usually given by a health care professional in a hospital or clinic setting. If you get this medicine at home, you will be taught how to prepare and give this medicine. Use exactly as directed. Take your medicine at regular intervals. Do not take your medicine more often than directed. It is important that you put your used needles and syringes in a special sharps container. Do not put them in a trash can. If you do not have a sharps container, call your pharmacist or healthcare provider to get one. Talk to your pediatrician regarding the use of this medicine in children. While this drug may be prescribed for selected conditions, precautions do apply. This medicine is not approved for use in newborns and premature babies. Patients over 69 years old may have a stronger reaction and need a smaller dose. Overdosage: If you think you have taken too much of this medicine contact a poison control center or emergency room at once. NOTE: This medicine is only for you. Do not share this medicine with others. What if I miss a dose? If you miss a  dose, take it as soon as you can. If it is almost time for your next dose, take only that dose. Do not take double or extra doses. What may interact with this medicine? Do not take this medicine with any of the following medications: -MAOIs like Carbex, Eldepryl, Marplan, Nardil, and Parnate This medicine may also interact with the following medications: -alcohol -barbiturates, like phenobarbital -medicines for bladder spasm like oxybutynin, tolterodine -medicines for blood pressure -medicines for depression, anxiety, or psychotic disturbances -medicines for movement abnormalities or Parkinson's disease -medicines for sleep -other medicines for cold, cough or allergy -some medicines for the stomach like chlordiazepoxide, dicyclomine This list may not describe all possible interactions. Give your health care provider a list of all the medicines, herbs, non-prescription drugs, or dietary supplements you use. Also tell them if you smoke, drink alcohol, or use illegal drugs. Some items may interact with your medicine. What should I watch for while using this medicine? Your condition will be monitored carefully while you are receiving this medicine. Tell your doctor or healthcare professional if your symptoms do not start to get better or if they get worse. You may get drowsy or dizzy. Do not drive, use machinery, or do anything that needs mental alertness until you know how this medicine affects you. Do not stand or sit up quickly, especially if you are an older patient. This reduces the risk of dizzy or fainting spells. Alcohol may interfere with the effect of this medicine. Avoid alcoholic drinks. Your mouth may get dry. Chewing sugarless gum or sucking hard candy, and drinking plenty of water may help. Contact your doctor if the problem does not go away or is severe. What side effects may I notice from receiving this medicine? Side effects that you should report to your doctor or health care  professional as soon as possible: -allergic reactions like skin rash, itching or hives, swelling of the face, lips, or  tongue -breathing problems -changes in vision -chills -confused, agitated, nervous -irregular or fast heartbeat -low blood pressure -seizures -tremor -trouble passing urine -unusual bleeding or bruising -unusually weak or tired Side effects that usually do not require medical attention (report to your doctor or health care professional if they continue or are bothersome): -constipation, diarrhea -drowsy -headache -loss of appetite -stomach upset, vomiting -sweating -thick mucous This list may not describe all possible side effects. Call your doctor for medical advice about side effects. You may report side effects to FDA at 1-800-FDA-1088. Where should I keep my medicine? Keep out of the reach of children. If you are using this medicine at home, you will be instructed on how to store this medicine. Throw away any unused medicine after the expiration date on the label. NOTE: This sheet is a summary. It may not cover all possible information. If you have questions about this medicine, talk to your doctor, pharmacist, or health care provider.    2016, [employer]. (2007-04-17 14:28:35) Diphenhydramine injection What is this medicine? DIPHENHYDRAMINE (dye fen HYE dra meen) is an antihistamine. It is used to treat the symptoms of an allergic reaction and motion sickness. It is also used to treat Parkinson's disease. This medicine may be used for other purposes; ask your health care provider or pharmacist if you have questions. What should I tell my health care provider before I take this medicine? They need to know if you have any of these conditions: -asthma or lung disease -glaucoma -high blood pressure or heart disease -liver disease -pain or difficulty passing urine -prostate trouble -ulcers or other stomach problems -an unusual or allergic  reaction to diphenhydramine, antihistamines, other medicines foods, dyes, or preservatives -pregnant or trying to get pregnant -breast-feeding How should I use this medicine? This medicine is for injection into a vein or a muscle. It is usually given by a health care professional in a hospital or clinic setting. If you get this medicine at home, you will be taught how to prepare and give this medicine. Use exactly as directed. Take your medicine at regular intervals. Do not take your medicine more often than directed. It is important that you put your used needles and syringes in a special sharps container. Do not put them in a trash can. If you do not have a sharps container, call your pharmacist or healthcare provider to get one. Talk to your pediatrician regarding the use of this medicine in children. While this drug may be prescribed for selected conditions, precautions do apply. This medicine is not approved for use in newborns and premature babies. Patients over 34 years old may have a stronger reaction and need a smaller dose. Overdosage: If you think you have taken too much of this medicine contact a poison control center or emergency room at once. NOTE: This medicine is only for you. Do not share this medicine with others. What if I miss a dose? If you miss a dose, take it as soon as you can. If it is almost time for your next dose, take only that dose. Do not take double or extra doses. What may interact with this medicine? Do not take this medicine with any of the following medications: -MAOIs like Carbex, Eldepryl, Marplan, Nardil, and Parnate This medicine may also interact with the following medications: -alcohol -barbiturates, like phenobarbital -medicines for bladder spasm like oxybutynin, tolterodine -medicines for blood pressure -medicines for depression, anxiety, or psychotic disturbances -medicines for movement abnormalities or Parkinson's disease -medicines for  sleep -other medicines for cold, cough or allergy -some medicines for the stomach like chlordiazepoxide, dicyclomine This list may not describe all possible interactions. Give your health care provider a list of all the medicines, herbs, non-prescription drugs, or dietary supplements you use. Also tell them if you smoke, drink alcohol, or use illegal drugs. Some items may interact with your medicine. What should I watch for while using this medicine? Your condition will be monitored carefully while you are receiving this medicine. Tell your doctor or healthcare professional if your symptoms do not start to get better or if they get worse. You may get drowsy or dizzy. Do not drive, use machinery, or do anything that needs mental alertness until you know how this medicine affects you. Do not stand or sit up quickly, especially if you are an older patient. This reduces the risk of dizzy or fainting spells. Alcohol may interfere with the effect of this medicine. Avoid alcoholic drinks. Your mouth may get dry. Chewing sugarless gum or sucking hard candy, and drinking plenty of water may help. Contact your doctor if the problem does not go away or is severe. What side effects may I notice from receiving this medicine? Side effects that you should report to your doctor or health care professional as soon as possible: -allergic reactions like skin rash, itching or hives, swelling of the face, lips, or tongue -breathing problems -changes in vision -chills -confused, agitated, nervous -irregular or fast heartbeat -low blood pressure -seizures -tremor -trouble passing urine -unusual bleeding or bruising -unusually weak or tired Side effects that usually do not require medical attention (report to your doctor or health care professional if they continue or are bothersome): -constipation, diarrhea -drowsy -headache -loss of appetite -stomach upset, vomiting -sweating -thick mucous This list may not  describe all possible side effects. Call your doctor for medical advice about side effects. You may report side effects to FDA at 1-800-FDA-1088. Where should I keep my medicine? Keep out of the reach of children. If you are using this medicine at home, you will be instructed on how to store this medicine. Throw away any unused medicine after the expiration date on the label. NOTE: This sheet is a summary. It may not cover all possible information. If you have questions about this medicine, talk to your doctor, pharmacist, or health care provider.    2016, [employer]. (2007-04-17 14:28:35) Lidocaine; Prilocaine cream What is this medicine? LIDOCAINE; PRILOCAINE (LYE doe kane; PRIL oh kane) is a topical anesthetic that causes loss of feeling in the skin and surrounding tissues. It is used to numb the skin before procedures or injections. This medicine may be used for other purposes; ask your health care provider or pharmacist if you have questions. What should I tell my health care provider before I take this medicine? They need to know if you have any of these conditions: -glucose-6-phosphate deficiencies -heart disease -kidney or liver disease -methemoglobinemia -an unusual or allergic reaction to lidocaine, prilocaine, other medicines, foods, dyes, or preservatives -pregnant or trying to get pregnant -breast-feeding How should I use this medicine? This medicine is for external use only on the skin. Do not take by mouth. Follow the directions on the prescription label. Wash hands before and after use. Do not use more or leave in contact with the skin longer than directed. Do not apply to eyes or open wounds. It can cause irritation and blurred or temporary loss of vision. If this medicine comes in contact with your  eyes, immediately rinse the eye with water. Do not touch or rub the eye. Contact your health care provider right away. Talk to your pediatrician regarding the use of  this medicine in children. While this medicine may be prescribed for children for selected conditions, precautions do apply. Overdosage: If you think you have taken too much of this medicine contact a poison control center or emergency room at once. NOTE: This medicine is only for you. Do not share this medicine with others. What if I miss a dose? This medicine is usually only applied once prior to each procedure. It must be in contact with the skin for a period of time for it to work. If you applied this medicine later than directed, tell your health care professional before starting the procedure. What may interact with this medicine? -acetaminophen -chloroquine -dapsone -medicines to control heart rhythm -nitrates like nitroglycerin and nitroprusside -other ointments, creams, or sprays that may contain anesthetic medicine -phenobarbital -phenytoin -quinine -sulfonamides like sulfacetamide, sulfamethoxazole, sulfasalazine and others This list may not describe all possible interactions. Give your health care provider a list of all the medicines, herbs, non-prescription drugs, or dietary supplements you use. Also tell them if you smoke, drink alcohol, or use illegal drugs. Some items may interact with your medicine. What should I watch for while using this medicine? Be careful to avoid injury to the treated area while it is numb and you are not aware of pain. Avoid scratching, rubbing, or exposing the treated area to hot or cold temperatures until complete sensation has returned. The numb feeling will wear off a few hours after applying the cream. What side effects may I notice from receiving this medicine? Side effects that you should report to your doctor or health care professional as soon as possible: -blurred vision -chest pain -difficulty breathing -dizziness -drowsiness -fast or irregular heartbeat -skin rash or itching -swelling of your throat, lips, or face -trembling Side  effects that usually do not require medical attention (report to your doctor or health care professional if they continue or are bothersome): -changes in ability to feel hot or cold -redness and swelling at the application site This list may not describe all possible side effects. Call your doctor for medical advice about side effects. You may report side effects to FDA at 1-800-FDA-1088. Where should I keep my medicine? Keep out of reach of children. Store at room temperature between 15 and 30 degrees C (59 and 86 degrees F). Keep container tightly closed. Throw away any unused medicine after the expiration date. NOTE: This sheet is a summary. It may not cover all possible information. If you have questions about this medicine, talk to your doctor, pharmacist, or health care provider.    2016, [employer]. (2007-07-02 17:14:35) Ondansetron tablets What is this medicine? ONDANSETRON (on DAN se tron) is used to treat nausea and vomiting caused by chemotherapy. It is also used to prevent or treat nausea and vomiting after surgery. This medicine may be used for other purposes; ask your health care provider or pharmacist if you have questions. What should I tell my health care provider before I take this medicine? They need to know if you have any of these conditions: -heart disease -history of irregular heartbeat -liver disease -low levels of magnesium or potassium in the blood -an unusual or allergic reaction to ondansetron, granisetron, other medicines, foods, dyes, or preservatives -pregnant or trying to get pregnant -breast-feeding How should I use this medicine? Take this medicine by mouth  with a glass of water. Follow the directions on your prescription label. Take your doses at regular intervals. Do not take your medicine more often than directed. Talk to your pediatrician regarding the use of this medicine in children. Special care may be needed. Overdosage: If you think  you have taken too much of this medicine contact a poison control center or emergency room at once. NOTE: This medicine is only for you. Do not share this medicine with others. What if I miss a dose? If you miss a dose, take it as soon as you can. If it is almost time for your next dose, take only that dose. Do not take double or extra doses. What may interact with this medicine? Do not take this medicine with any of the following medications: -apomorphine -certain medicines for fungal infections like fluconazole, itraconazole, ketoconazole, posaconazole, voriconazole -cisapride -dofetilide -dronedarone -pimozide -thioridazine -ziprasidone This medicine may also interact with the following medications: -carbamazepine -certain medicines for depression, anxiety, or psychotic disturbances -fentanyl -linezolid -MAOIs like Carbex, Eldepryl, Marplan, Nardil, and Parnate -methylene blue (injected into a vein) -other medicines that prolong the QT interval (cause an abnormal heart rhythm) -phenytoin -rifampicin -tramadol This list may not describe all possible interactions. Give your health care provider a list of all the medicines, herbs, non-prescription drugs, or dietary supplements you use. Also tell them if you smoke, drink alcohol, or use illegal drugs. Some items may interact with your medicine. What should I watch for while using this medicine? Check with your doctor or health care professional right away if you have any sign of an allergic reaction. What side effects may I notice from receiving this medicine? Side effects that you should report to your doctor or health care professional as soon as possible: -allergic reactions like skin rash, itching or hives, swelling of the face, lips or tongue -breathing problems -confusion -dizziness -fast or irregular heartbeat -feeling faint or lightheaded, falls -fever and chills -loss of balance or  coordination -seizures -sweating -swelling of the hands or feet -tightness in the chest -tremors -unusually weak or tired Side effects that usually do not require medical attention (report to your doctor or health care professional if they continue or are bothersome): -constipation or diarrhea -headache This list may not describe all possible side effects. Call your doctor for medical advice about side effects. You may report side effects to FDA at 1-800-FDA-1088. Where should I keep my medicine? Keep out of the reach of children. Store between 2 and 30 degrees C (36 and 86 degrees F). Throw away any unused medicine after the expiration date. NOTE: This sheet is a summary. It may not cover all possible information. If you have questions about this medicine, talk to your doctor, pharmacist, or health care provider.    2016, [employer]. (2012-10-03 16:27:45) Prochlorperazine tablets What is this medicine? PROCHLORPERAZINE (proe klor PER a zeen) helps to control severe nausea and vomiting. This medicine is also used to treat schizophrenia. It can also help patients who experience anxiety that is not due to psychological illness. This medicine may be used for other purposes; ask your health care provider or pharmacist if you have questions. What should I tell my health care provider before I take this medicine? They need to know if you have any of these conditions: -blood disorders or disease -dementia -liver disease or jaundice -Parkinson's disease -uncontrollable movement disorder -an unusual or allergic reaction to prochlorperazine, other medicines, foods, dyes, or preservatives -pregnant or trying to get  pregnant -breast-feeding How should I use this medicine? Take this medicine by mouth with a glass of water. Follow the directions on the prescription label. Take your doses at regular intervals. Do not take your medicine more often than directed. Do not stop taking this  medicine suddenly. This can cause nausea, vomiting, and dizziness. Ask your doctor or health care professional for advice. Talk to your pediatrician regarding the use of this medicine in children. Special care may be needed. While this drug may be prescribed for children as young as 2 years for selected conditions, precautions do apply. Overdosage: If you think you have taken too much of this medicine contact a poison control center or emergency room at once. NOTE: This medicine is only for you. Do not share this medicine with others. What if I miss a dose? If you miss a dose, take it as soon as you can. If it is almost time for your next dose, take only that dose. Do not take double or extra doses. What may interact with this medicine? Do not take this medicine with any of the following medications: -amoxapine -antidepressants like citalopram, escitalopram, fluoxetine, paroxetine, and sertraline -deferoxamine -dofetilide -maprotiline -tricyclic antidepressants like amitriptyline, clomipramine, imipramine, nortiptyline and others This medicine may also interact with the following medications: -lithium -medicines for pain -phenytoin -propranolol -warfarin This list may not describe all possible interactions. Give your health care provider a list of all the medicines, herbs, non-prescription drugs, or dietary supplements you use. Also tell them if you smoke, drink alcohol, or use illegal drugs. Some items may interact with your medicine. What should I watch for while using this medicine? Visit your doctor or health care professional for regular checks on your progress. You may get drowsy or dizzy. Do not drive, use machinery, or do anything that needs mental alertness until you know how this medicine affects you. Do not stand or sit up quickly, especially if you are an older patient. This reduces the risk of dizzy or fainting spells. Alcohol may interfere with the effect of this medicine. Avoid  alcoholic drinks. This medicine can reduce the response of your body to heat or cold. Dress warm in cold weather and stay hydrated in hot weather. If possible, avoid extreme temperatures like saunas, hot tubs, very hot or cold showers, or activities that can cause dehydration such as vigorous exercise. This medicine can make you more sensitive to the sun. Keep out of the sun. If you cannot avoid being in the sun, wear protective clothing and use sunscreen. Do not use sun lamps or tanning beds/booths. Your mouth may get dry. Chewing sugarless gum or sucking hard candy, and drinking plenty of water may help. Contact your doctor if the problem does not go away or is severe. What side effects may I notice from receiving this medicine? Side effects that you should report to your doctor or health care professional as soon as possible: -blurred vision -breast enlargement in men or women -breast milk in women who are not breast-feeding -chest pain, fast or irregular heartbeat -confusion, restlessness -dark yellow or brown urine -difficulty breathing or swallowing -dizziness or fainting spells -drooling, shaking, movement difficulty (shuffling walk) or rigidity -fever, chills, sore throat -involuntary or uncontrollable movements of the eyes, mouth, head, arms, and legs -seizures -stomach area pain -unusually weak or tired -unusual bleeding or bruising -yellowing of skin or eyes Side effects that usually do not require medical attention (report to your doctor or health care professional if they continue  or are bothersome): -difficulty passing urine -difficulty sleeping -headache -sexual dysfunction -skin rash, or itching This list may not describe all possible side effects. Call your doctor for medical advice about side effects. You may report side effects to FDA at 1-800-FDA-1088. Where should I keep my medicine? Keep out of the reach of children. Store at room temperature between 15 and 30  degrees C (59 and 86 degrees F). Protect from light. Throw away any unused medicine after the expiration date. NOTE: This sheet is a summary. It may not cover all possible information. If you have questions about this medicine, talk to your doctor, pharmacist, or health care provider.    2016, [employer]. (2011-05-17 16:59:39)

## 2015-03-19 ENCOUNTER — Encounter (HOSPITAL_COMMUNITY): Payer: Medicare Other | Attending: Hematology & Oncology

## 2015-03-19 DIAGNOSIS — C159 Malignant neoplasm of esophagus, unspecified: Secondary | ICD-10-CM | POA: Insufficient documentation

## 2015-03-20 ENCOUNTER — Telehealth (HOSPITAL_COMMUNITY): Payer: Self-pay | Admitting: *Deleted

## 2015-03-20 NOTE — Telephone Encounter (Signed)
Daughter notified that chemo will start Friday 03/27/15 for chemo. To start XRT on 03/23/15. PEG tube to be placed on 03/26/15 @ [facility]. Appt list to be faxed to [facility] and called to nurse providing care to patient @ [facility].

## 2015-03-22 ENCOUNTER — Encounter (HOSPITAL_COMMUNITY): Payer: Self-pay | Admitting: Hematology & Oncology

## 2015-03-23 ENCOUNTER — Other Ambulatory Visit (HOSPITAL_COMMUNITY): Payer: Self-pay | Admitting: *Deleted

## 2015-03-23 ENCOUNTER — Inpatient Hospital Stay (HOSPITAL_COMMUNITY): Payer: Medicare Other

## 2015-03-23 DIAGNOSIS — C159 Malignant neoplasm of esophagus, unspecified: Secondary | ICD-10-CM

## 2015-03-23 MED ORDER — PROCHLORPERAZINE MALEATE 10 MG PO TABS: 10.0000 mg | ORAL_TABLET | Freq: Four times a day (QID) | ORAL | Status: DC | PRN

## 2015-03-23 MED ORDER — LIDOCAINE-PRILOCAINE 2.5-2.5 % EX CREA: TOPICAL_CREAM | CUTANEOUS | Status: DC

## 2015-03-23 MED ORDER — LIDOCAINE-PRILOCAINE 2.5-2.5 % EX CREA
TOPICAL_CREAM | CUTANEOUS | Status: DC
Start: 2015-03-23 — End: 2015-10-22

## 2015-03-23 MED ORDER — ONDANSETRON 8 MG PO TBDP: 8.0000 mg | ORAL_TABLET | Freq: Three times a day (TID) | ORAL | Status: DC | PRN

## 2015-03-23 MED ORDER — PROCHLORPERAZINE MALEATE 10 MG PO TABS: 10.0000 mg | ORAL_TABLET | Freq: Four times a day (QID) | ORAL | Status: AC | PRN

## 2015-03-23 MED ORDER — ONDANSETRON 8 MG PO TBDP: 8.0000 mg | ORAL_TABLET | Freq: Three times a day (TID) | ORAL | Status: AC | PRN

## 2015-03-25 ENCOUNTER — Other Ambulatory Visit (HOSPITAL_COMMUNITY): Payer: Self-pay | Admitting: Hematology & Oncology

## 2015-03-25 ENCOUNTER — Other Ambulatory Visit: Payer: Self-pay | Admitting: Radiology

## 2015-03-26 ENCOUNTER — Inpatient Hospital Stay (HOSPITAL_COMMUNITY): Admission: RE | Admit: 2015-03-26 | Payer: Medicare Other | Source: Ambulatory Visit

## 2015-03-26 ENCOUNTER — Telehealth: Payer: Self-pay | Admitting: *Deleted

## 2015-03-26 ENCOUNTER — Telehealth (HOSPITAL_COMMUNITY): Payer: Self-pay | Admitting: *Deleted

## 2015-03-26 ENCOUNTER — Ambulatory Visit (HOSPITAL_COMMUNITY): Payer: Medicare Other

## 2015-03-26 NOTE — Assessment & Plan Note (Addendum)
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  Oncology history udated.  He was scheduled for PEG placement yesterday, 3/16 with IR, but he was a no-show for this procedure.  This procedure is rescheduled for 03/31/2015 with IR.  He is reminded of this appointment.  He is educated on the role of a PEG tube for supportive care during his treatment/cancer care.  His weight is down 2 more lbs compared to 2 weeks ago.  He notes that his appetite is good and his clothes still fit him as he remembers.    Pre-treatment labs today: CBC diff, CMET.  Carboplatin dosing will be based upon renal function today.  Return next week for follow-up and cycle #2.

## 2015-03-26 NOTE — Telephone Encounter (Signed)
Gave documentation back to Billy Carson, to fax to [place] office 11:05 AM

## 2015-03-26 NOTE — Telephone Encounter (Signed)
Message left for [facility] Director of [facility] regarding patient missing his PEG tube insertion appointment.

## 2015-03-26 NOTE — Progress Notes (Signed)
Jani Gravel, MD [address] [place] [address]  Squamous cell carcinoma of esophagus [facility])  CURRENT THERAPY: Beginning concomitant chemoradiation with Carboplatin/Paclitaxel on 03/27/2015  INTERVAL HISTORY: Billy Carson 70 y.o. 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    Squamous cell carcinoma of esophagus ([place])   02/10/2015 Procedure Colonoscopy by Dr. Oneida Alar   02/10/2015 Pathology Results Colon, polyp(s), sigmoid - TRADITIONAL SERRATED ADENOMA (2.3 CM) WITH HIGH GRADE DYSPLASIA. - HIGH GRADE DYSPLASIA INVOLVES APPROXIMATELY 10 TO 15% OF THE POLYP. - CAUTERIZED EDGES ARE FREE OF HIGH GRADE DYSPLASIA BUT ARE INVOLVED WITH ADENOMATOUS CHAN   02/10/2015 Procedure EGD by Dr. Oneida Alar   02/10/2015 Pathology Results Esophagus, biopsy, mass - INVASIVE SQUAMOUS CELL CARCINOMA.   02/25/2015 PET scan Esophageal mass extends longitudinally over about 6 cm and is associated with very hypermetabolic activity. No definite separable adenopathy in the chest or other metastatic disease identified. Coarsened trabeculation with very faintly accentuated...   03/11/2015 Procedure Port placement by IR   03/23/2015 -  Radiation Therapy    03/27/2015 -  Chemotherapy Carboplaton/Paclitaxel weekly     I personally reviewed and went over laboratory results with the patient.  The results are noted within this dictation.  Patient started XRT on 03/23/2015.  He will begin chemotherapy today.  He was suppose to have his PEG placed yesterday, 3/16, but the patient no-showed for this IR procedure. This has been rescheduled to 03/31/2015.  She denies any complaints. Today is his first treatment. All questions are answered today. He will have therapy teaching momentarily.   Past Medical History  Diagnosis Date  .  Hypertension   . Hyperlipidemia   . Stroke [facility])     left sided weakness  . Squamous cell carcinoma of esophagus (HCC) 02/26/2015    has CVA (cerebral infarction); Tobacco abuse; Alcohol abuse; Hypertension; Encounter for screening colonoscopy; and Squamous cell carcinoma of esophagus ([facility]) on his problem list.     has No Known Allergies.  Current Outpatient Prescriptions on File Prior to Visit  Medication Sig Dispense Refill  . atorvastatin (LIPITOR) 10 MG tablet Take 10 mg by mouth daily at 6 PM.     . CARBOPLATIN IV Inject into the vein. To be given once a week concurrently with XRT    . ENSURE PLUS (ENSURE PLUS) LIQD Take 237 mLs by mouth 2 (two) times daily.    . folic acid (FOLVITE) [id number] MCG tablet Take 400 mcg by mouth daily.    . hydrALAZINE (APRESOLINE) 10 MG tablet Take 1 tablet (10 mg total) by mouth every 8 (eight) hours. (Patient taking differently: Take 10 mg by mouth 2 (two) times daily. ) 90 tablet 1  . lidocaine-prilocaine (EMLA) cream Apply a quarter size amount to port site 1 hour prior to chemo. Do not rub in. Cover with plastic wrap. 30 g 3  . lisinopril (PRINIVIL,ZESTRIL) 10 MG tablet Take 10 mg by mouth daily.    . metoprolol tartrate (LOPRESSOR) 25 MG tablet Take 12.5 mg by mouth 2 (two) times daily.    . Multiple Vitamin (MULTIVITAMIN WITH MINERALS) TABS tablet Take 1 tablet by mouth daily. 30 tablet 1  . ondansetron (ZOFRAN ODT) 8 MG disintegrating tablet Take 1 tablet (8 mg total) by mouth every 8 (eight) hours as needed for nausea or vomiting.  30 tablet 2  . PACLitaxel (TAXOL IV) Inject into the vein. To be given once a week concurrently with XRT    . prochlorperazine (COMPAZINE) 10 MG tablet Take 1 tablet (10 mg total) by mouth every 6 (six) hours as needed for nausea or vomiting. 30 tablet 2  . promethazine (PHENERGAN) 25 MG tablet Take 12.5 mg by mouth every 6 (six) hours as needed for nausea or vomiting.     Marland Kitchen PROMETHAZINE HCL IJ Inject 12.5 mg as directed  every 6 (six) hours as needed (Nausea and vomitting).    Marland Kitchen senna-docusate (SENOKOT-S) 8.6-50 MG per tablet Take 1 tablet by mouth daily.    . Vitamin D, Ergocalciferol, (DRISDOL) 50000 units CAPS capsule Take 50,000 Units by mouth every 30 (thirty) days.    . cyanocobalamin (,VITAMIN B-12,) 1000 MCG/ML injection Inject 1,000 mcg into the muscle every 30 (thirty) days.     Current Facility-Administered Medications on File Prior to Visit  Medication Dose Route Frequency Provider Last Rate Last Dose  . sodium chloride flush (NS) 0.9 % injection 10 mL  10 mL Intracatheter PRN Patrici Ranks, MD   10 mL at 03/27/15 0945    Past Surgical History  Procedure Laterality Date  . Tonsillectomy    . Cataract extraction w/phaco Left 10/14/2013    Procedure: CATARACT EXTRACTION PHACO AND INTRAOCULAR LENS PLACEMENT LEFT EYE CDE=11.04;  Surgeon: Williams Che, MD;  Location: [facility];  Service: Ophthalmology;  Laterality: Left;  . Cataract extraction w/phaco Right 12/30/2013    Procedure: CATARACT EXTRACTION PHACO AND INTRAOCULAR LENS PLACEMENT; CDE:  110.53;  Surgeon: Williams Che, MD;  Location: [facility];  Service: Ophthalmology;  Laterality: Right;  . Colonoscopy with propofol N/A 02/10/2015    Procedure: COLONOSCOPY WITH PROPOFOL;  Surgeon: Danie Binder, MD;  Location: [facility];  Service: Endoscopy;  Laterality: N/A;  1030  . Esophagogastroduodenoscopy (egd) with propofol N/A 02/10/2015    Procedure: ESOPHAGOGASTRODUODENOSCOPY (EGD) WITH PROPOFOL;  Surgeon: Danie Binder, MD;  Location: [facility];  Service: Endoscopy;  Laterality: N/A;  . Polypectomy N/A 02/10/2015    Procedure: POLYPECTOMY;  Surgeon: Danie Binder, MD;  Location: [facility];  Service: Endoscopy;  Laterality: N/A;  cecum x 1 and ascending colon polyp x1, Medium sigmoid colon polyp, Medium and Large sigmoid colon polyps  . Biopsy N/A 02/10/2015    Procedure: BIOPSY;  Surgeon: Danie Binder, MD;  Location: [facility];   Service: Endoscopy;  Laterality: N/A;  Esophageal mass biopsies, Gastric biopsies  . Eus N/A 03/05/2015    Procedure: UPPER ENDOSCOPIC ULTRASOUND (EUS) RADIAL;  Surgeon: Milus Banister, MD;  Location: [facility];  Service: Endoscopy;  Laterality: N/A;    Denies any headaches, dizziness, double vision, fevers, chills, night sweats, nausea, vomiting, diarrhea, constipation, chest pain, heart palpitations, shortness of breath, blood in stool, black tarry stool, urinary pain, urinary burning, urinary frequency, hematuria.   PHYSICAL EXAMINATION  ECOG PERFORMANCE STATUS: 2 - Symptomatic, <50% confined to bed  There were no vitals filed for this visit.  GENERAL:alert, well nourished, well developed, comfortable, cooperative, smiling and in a chemotherapy recliner, port accessed. SKIN: skin color, texture, turgor are normal, no rashes or significant lesions HEAD: Normocephalic, No masses, lesions, tenderness or abnormalities EYES: normal, EOMI, Conjunctiva are pink and non-injected EARS: External ears normal OROPHARYNX:mucous membranes are moist  NECK: trachea midline, supple LYMPH:  not examined BREAST:not examined LUNGS: Clear to auscultation bilaterally without wheezes, rales, or  rhonchi. HEART: Regular rate and rhythm without murmur, rub, or gallop. Normal S1 and S2. ABDOMEN: Positive bowel sounds in all 4 quadrants, nontender, soft BACK: Back symmetric, no curvature. EXTREMITIES:less then 2 second capillary refill, no skin discoloration, no cyanosis  NEURO: alert & oriented x 3 with fluent speech   LABORATORY DATA: CBC    Component Value Date/Time   WBC 7.5 03/27/2015 0946   RBC 4.09* 03/27/2015 0946   HGB 12.9* 03/27/2015 0946   HCT 38.9* 03/27/2015 0946   PLT 182 03/27/2015 0946   MCV 95.1 03/27/2015 0946   MCH 31.5 03/27/2015 0946   MCHC 33.2 03/27/2015 0946   RDW 12.8 03/27/2015 0946   LYMPHSABS 1.3 03/27/2015 0946   MONOABS 1.0 03/27/2015 0946   EOSABS 0.1 03/27/2015  0946   BASOSABS 0.0 03/27/2015 0946      Chemistry      Component Value Date/Time   NA 139 03/27/2015 0946   K 4.2 03/27/2015 0946   CL 104 03/27/2015 0946   CO2 27 03/27/2015 0946   BUN 16 03/27/2015 0946   CREATININE 1.29* 03/27/2015 0946      Component Value Date/Time   CALCIUM 9.5 03/27/2015 0946   ALKPHOS 87 03/27/2015 0946   AST 19 03/27/2015 0946   ALT 14* 03/27/2015 0946   BILITOT 0.7 03/27/2015 0946        PENDING LABS:   RADIOGRAPHIC STUDIES:  Ir Fluoro Guide Cv Line Right  03/11/2015  CLINICAL DATA:  Esophageal carcinoma, needs long-term venous access for chemotherapy. EXAM: TUNNELED PORT CATHETER PLACEMENT WITH ULTRASOUND AND FLUOROSCOPIC GUIDANCE FLUOROSCOPY TIME:  54 seconds, 8 mGy ANESTHESIA/SEDATION: Intravenous Fentanyl and Versed were administered as conscious sedation during continuous monitoring of the patient's level of consciousness and physiological / cardiorespiratory status by the radiology RN, with a total moderate sedation time of 15 minutes. TECHNIQUE: The procedure, risks, benefits, and alternatives were explained to the patient. Questions regarding the procedure were encouraged and answered. The patient understands and consents to the procedure. As antibiotic prophylaxis, cefazolin 2 g was ordered pre-procedure and administered intravenously within one hour of incision. Patency of the right IJ vein was confirmed with ultrasound with image documentation. An appropriate skin site was determined. Skin site was marked. Region was prepped using maximum barrier technique including cap and mask, sterile gown, sterile gloves, large sterile sheet, and Chlorhexidine as cutaneous antisepsis. The region was infiltrated locally with 1% lidocaine. Under real-time ultrasound guidance, the right IJ vein was accessed with a 21 gauge micropuncture needle; the needle tip within the vein was confirmed with ultrasound image documentation. Needle was exchanged over a 018  guidewire for transitional dilator which allowed passage of the [facility] wire into the IVC. Over this, the transitional dilator was exchanged for a 5 [place] MPA catheter. A small incision was made on the right anterior chest wall and a subcutaneous pocket fashioned. The power-injectable port was positioned and its catheter tunneled to the right IJ dermatotomy site. The MPA catheter was exchanged over an Amplatz wire for a peel-away sheath, through which the port catheter, which had been trimmed to the appropriate length, was advanced and positioned under fluoroscopy with its tip at the cavoatrial junction. Spot chest radiograph confirms good catheter position and no pneumothorax. The pocket was closed with deep interrupted and subcuticular continuous 3-0 Monocryl sutures. The port was flushed per protocol. The incisions were covered with Dermabond then covered with a sterile dressing. COMPLICATIONS: COMPLICATIONS None immediate IMPRESSION: Technically successful right IJ power-injectable port  catheter placement. Ready for routine use. Electronically Signed   By: Lucrezia Europe M.D.   On: 03/11/2015 15:08   Ir US Guide Vasc Access Right  03/11/2015  CLINICAL DATA:  Esophageal carcinoma, needs long-term venous access for chemotherapy. EXAM: TUNNELED PORT CATHETER PLACEMENT WITH ULTRASOUND AND FLUOROSCOPIC GUIDANCE FLUOROSCOPY TIME:  54 seconds, 8 mGy ANESTHESIA/SEDATION: Intravenous Fentanyl and Versed were administered as conscious sedation during continuous monitoring of the patient's level of consciousness and physiological / cardiorespiratory status by the radiology RN, with a total moderate sedation time of 15 minutes. TECHNIQUE: The procedure, risks, benefits, and alternatives were explained to the patient. Questions regarding the procedure were encouraged and answered. The patient understands and consents to the procedure. As antibiotic prophylaxis, cefazolin 2 g was ordered pre-procedure and administered  intravenously within one hour of incision. Patency of the right IJ vein was confirmed with ultrasound with image documentation. An appropriate skin site was determined. Skin site was marked. Region was prepped using maximum barrier technique including cap and mask, sterile gown, sterile gloves, large sterile sheet, and Chlorhexidine as cutaneous antisepsis. The region was infiltrated locally with 1% lidocaine. Under real-time ultrasound guidance, the right IJ vein was accessed with a 21 gauge micropuncture needle; the needle tip within the vein was confirmed with ultrasound image documentation. Needle was exchanged over a 018 guidewire for transitional dilator which allowed passage of the [facility] wire into the IVC. Over this, the transitional dilator was exchanged for a 5 [place] MPA catheter. A small incision was made on the right anterior chest wall and a subcutaneous pocket fashioned. The power-injectable port was positioned and its catheter tunneled to the right IJ dermatotomy site. The MPA catheter was exchanged over an Amplatz wire for a peel-away sheath, through which the port catheter, which had been trimmed to the appropriate length, was advanced and positioned under fluoroscopy with its tip at the cavoatrial junction. Spot chest radiograph confirms good catheter position and no pneumothorax. The pocket was closed with deep interrupted and subcuticular continuous 3-0 Monocryl sutures. The port was flushed per protocol. The incisions were covered with Dermabond then covered with a sterile dressing. COMPLICATIONS: COMPLICATIONS None immediate IMPRESSION: Technically successful right IJ power-injectable port catheter placement. Ready for routine use. Electronically Signed   By: Lucrezia Europe M.D.   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  Oncology history udated.  He was scheduled for PEG placement yesterday, 3/16 with IR, but he was a no-show for this procedure.  This procedure is rescheduled for 03/31/2015 with IR.  He is reminded of this appointment.  He is educated on the role of a PEG tube for supportive care during his treatment/cancer care.  His weight is down 2 more lbs compared to 2 weeks ago.  He notes that his appetite is good and his clothes still fit him as he remembers.    Pre-treatment labs today: CBC diff, CMET.  Carboplatin dosing will be based upon renal function today.  Return next week for follow-up and cycle #2.   THERAPY PLAN:  Treatment as outlined above.  All questions were answered. The patient knows to call the clinic with any problems, questions or concerns. We can certainly see the patient much sooner if necessary.  Patient and plan discussed with Dr.  Shannon Penland and she is in agreement with the aforementioned.   This note is electronically signed by: Doy Mince 03/27/2015 4:12 PM

## 2015-03-27 ENCOUNTER — Encounter (HOSPITAL_BASED_OUTPATIENT_CLINIC_OR_DEPARTMENT_OTHER): Payer: Medicare Other | Admitting: Oncology

## 2015-03-27 ENCOUNTER — Encounter (HOSPITAL_COMMUNITY): Payer: Medicare Other

## 2015-03-27 ENCOUNTER — Encounter (HOSPITAL_BASED_OUTPATIENT_CLINIC_OR_DEPARTMENT_OTHER): Payer: Medicare Other

## 2015-03-27 VITALS — BP 140/78 | HR 66 | Temp 98.1°F | Resp 16 | Ht 70.0 in | Wt 203.6 lb

## 2015-03-27 DIAGNOSIS — C159 Malignant neoplasm of esophagus, unspecified: Secondary | ICD-10-CM

## 2015-03-27 DIAGNOSIS — Z5111 Encounter for antineoplastic chemotherapy: Secondary | ICD-10-CM

## 2015-03-27 DIAGNOSIS — R634 Abnormal weight loss: Secondary | ICD-10-CM

## 2015-03-27 LAB — COMPREHENSIVE METABOLIC PANEL
ALBUMIN: 4.1 g/dL (ref 3.5–5.0)
ALK PHOS: 87 U/L (ref 38–126)
ALT: 14 U/L — ABNORMAL LOW (ref 17–63)
ANION GAP: 8 (ref 5–15)
AST: 19 U/L (ref 15–41)
BILIRUBIN TOTAL: 0.7 mg/dL (ref 0.3–1.2)
BUN: 16 mg/dL (ref 6–20)
CALCIUM: 9.5 mg/dL (ref 8.9–10.3)
CO2: 27 mmol/L (ref 22–32)
Chloride: 104 mmol/L (ref 101–111)
Creatinine, Ser: 1.29 mg/dL — ABNORMAL HIGH (ref 0.61–1.24)
GFR calc Af Amer: >60 mL/min (ref >60)
GFR, EST NON AFRICAN AMERICAN: 54 mL/min — AB (ref >60)
GLUCOSE: 104 mg/dL — AB (ref 65–99)
Potassium: 4.2 mmol/L (ref 3.5–5.1)
Sodium: 139 mmol/L (ref 135–145)
TOTAL PROTEIN: 7.8 g/dL (ref 6.5–8.1)

## 2015-03-27 LAB — CBC WITH DIFFERENTIAL/PLATELET
BASOS PCT: 0 %
Basophils Absolute: 0 10*3/uL (ref 0.0–0.1)
Eosinophils Absolute: 0.1 10*3/uL (ref 0.0–0.7)
Eosinophils Relative: 1 %
HEMATOCRIT: 38.9 % — AB (ref 39.0–52.0)
HEMOGLOBIN: 12.9 g/dL — AB (ref 13.0–17.0)
LYMPHS ABS: 1.3 10*3/uL (ref 0.7–4.0)
LYMPHS PCT: 18 %
MCH: 31.5 pg (ref 26.0–34.0)
MCHC: 33.2 g/dL (ref 30.0–36.0)
MCV: 95.1 fL (ref 78.0–100.0)
MONO ABS: 1 10*3/uL (ref 0.1–1.0)
MONOS PCT: 13 %
NEUTROS ABS: 5.2 10*3/uL (ref 1.7–7.7)
NEUTROS PCT: 68 %
Platelets: 182 10*3/uL (ref 150–400)
RBC: 4.09 MIL/uL — ABNORMAL LOW (ref 4.22–5.81)
RDW: 12.8 % (ref 11.5–15.5)
WBC: 7.5 10*3/uL (ref 4.0–10.5)

## 2015-03-27 MED ORDER — PACLITAXEL CHEMO INJECTION 300 MG/50ML
45.0000 mg/m2 | Freq: Once | INTRAVENOUS | Status: AC
  Administered 2015-03-27: 96 mg via INTRAVENOUS
  Filled 2015-03-27: qty 16

## 2015-03-27 MED ORDER — SODIUM CHLORIDE 0.9 % IV SOLN
Freq: Once | INTRAVENOUS | Status: AC
  Administered 2015-03-27: 10:00:00 via INTRAVENOUS

## 2015-03-27 MED ORDER — FAMOTIDINE IN NACL 20-0.9 MG/50ML-% IV SOLN
20.0000 mg | Freq: Once | INTRAVENOUS | Status: AC
  Administered 2015-03-27: 20 mg via INTRAVENOUS
  Filled 2015-03-27: qty 50

## 2015-03-27 MED ORDER — SODIUM CHLORIDE 0.9% FLUSH
10.0000 mL | INTRAVENOUS | Status: DC | PRN
  Administered 2015-03-27: 10 mL
  Filled 2015-03-27: qty 10

## 2015-03-27 MED ORDER — SODIUM CHLORIDE 0.9 % IV SOLN
186.2000 mg | Freq: Once | INTRAVENOUS | Status: AC
  Administered 2015-03-27: 190 mg via INTRAVENOUS
  Filled 2015-03-27: qty 19

## 2015-03-27 MED ORDER — PALONOSETRON HCL INJECTION 0.25 MG/5ML
0.2500 mg | Freq: Once | INTRAVENOUS | Status: AC
  Administered 2015-03-27: 0.25 mg via INTRAVENOUS
  Filled 2015-03-27: qty 5

## 2015-03-27 MED ORDER — HEPARIN SOD (PORK) LOCK FLUSH 100 UNIT/ML IV SOLN
500.0000 [IU] | Freq: Once | INTRAVENOUS | Status: AC | PRN
  Administered 2015-03-27: 500 [IU]
  Filled 2015-03-27: qty 5

## 2015-03-27 MED ORDER — SODIUM CHLORIDE 0.9 % IV SOLN
20.0000 mg | Freq: Once | INTRAVENOUS | Status: AC
  Administered 2015-03-27: 20 mg via INTRAVENOUS
  Filled 2015-03-27: qty 2

## 2015-03-27 MED ORDER — DIPHENHYDRAMINE HCL 50 MG/ML IJ SOLN
50.0000 mg | Freq: Once | INTRAMUSCULAR | Status: AC
  Administered 2015-03-27: 50 mg via INTRAVENOUS
  Filled 2015-03-27: qty 1

## 2015-03-27 NOTE — Patient Instructions (Signed)
[facility] Discharge Instructions for Patients Receiving Chemotherapy   Beginning January 23rd 2017 lab work for the [facility] will be done in the  Main lab at [facility] on 1st floor. If you have a lab appointment with the [facility] please come in thru the  Main Entrance and check in at the main information desk   Today you received the following chemotherapy agents Taxol and Carbo.  To help prevent nausea and vomiting after your treatment, we encourage you to take your nausea medication as instructed. If you develop nausea and vomiting, or diarrhea that is not controlled by your medication, call the clinic.  The clinic phone number is [phone]. Office hours are Monday-Friday 8:30am-5:00pm.  BELOW ARE SYMPTOMS THAT SHOULD BE REPORTED IMMEDIATELY:  *FEVER GREATER THAN 101.0 F  *CHILLS WITH OR WITHOUT FEVER  NAUSEA AND VOMITING THAT IS NOT CONTROLLED WITH YOUR NAUSEA MEDICATION  *UNUSUAL SHORTNESS OF BREATH  *UNUSUAL BRUISING OR BLEEDING  TENDERNESS IN MOUTH AND THROAT WITH OR WITHOUT PRESENCE OF ULCERS  *URINARY PROBLEMS  *BOWEL PROBLEMS  UNUSUAL RASH Items with * indicate a potential emergency and should be followed up as soon as possible. If you have an emergency after office hours please contact your primary care physician or go to the nearest emergency department.  Please call the clinic during office hours if you have any questions or concerns.   You may also contact the Patient Navigator at [phone] should you have any questions or need assistance in obtaining follow up care.   Resources For Cancer Patients and their Caregivers ? American Cancer Society: Can assist with transportation, wigs, general needs, runs Look Good Feel Better.        4181170574 ? Cancer Care: Provides financial assistance, online support groups, medication/co-pay assistance.  1-800-813-HOPE 414-687-9790) ? [place] Assists  [place] cancer patients and their families through emotional , educational and financial support.  320-047-0542 ? [place] DSS Where to apply for food stamps, Medicaid and utility assistance. 4846537339 ? [facility]: Transportation to medical appointments. 678-631-5653 ? Social Security Administration: May apply for disability if have a Stage IV cancer. 609 628 5983 (613) 223-3052 ? [employer], Disability and Transit Services: Assists with nutrition, care and transit needs. (825) 605-0444

## 2015-03-27 NOTE — Patient Instructions (Signed)
Billy Carson has lost 2 more lbs today compared to 2 weeks ago.  He missed his PEG tube placement appointment by interventional radiology on 03/26/2015.  This is now rescheduled for 03/31/2015.  This will be used to support his nutritional needs during treatment.  Chemotherapy today consisting of Carboplatin and Paclitaxel today as planned and then weekly.  Tayten will return next week for pre-treatment labs and cycle #2 of treatment.  Jessenya Berdan, PA-C 03/27/2015 10:13 AM

## 2015-03-27 NOTE — Progress Notes (Signed)
Chemo teaching done and consent signed for Carboplatin and Taxol. Distress screening done.

## 2015-03-30 ENCOUNTER — Telehealth (HOSPITAL_COMMUNITY): Payer: Self-pay | Admitting: *Deleted

## 2015-03-30 ENCOUNTER — Other Ambulatory Visit: Payer: Self-pay | Admitting: General Surgery

## 2015-03-30 NOTE — Telephone Encounter (Signed)
Spoke with Olin Hauser Mabe,LPN at [facility]. She reports she worked with patient on Saturday as well as today. She reports patient has not had any side effects post chemo and there are no changes to report. Reports patient is doing very well considering radiation and chemo.

## 2015-03-31 ENCOUNTER — Inpatient Hospital Stay (HOSPITAL_COMMUNITY): Admission: RE | Admit: 2015-03-31 | Payer: Medicare Other | Source: Ambulatory Visit

## 2015-03-31 ENCOUNTER — Other Ambulatory Visit: Payer: Self-pay | Admitting: Radiology

## 2015-03-31 ENCOUNTER — Ambulatory Visit (HOSPITAL_COMMUNITY): Payer: Medicare Other

## 2015-04-01 ENCOUNTER — Ambulatory Visit (HOSPITAL_COMMUNITY)
Admission: RE | Admit: 2015-04-01 | Discharge: 2015-04-01 | Disposition: A | Discharge status: Home / Self Care | Payer: Medicare Other | Source: Ambulatory Visit | Attending: Hematology & Oncology | Admitting: Hematology & Oncology

## 2015-04-01 ENCOUNTER — Encounter (HOSPITAL_COMMUNITY): Payer: Self-pay

## 2015-04-01 ENCOUNTER — Ambulatory Visit (HOSPITAL_COMMUNITY)
Admission: RE | Admit: 2015-04-01 | Discharge: 2015-04-01 | Disposition: A | Discharge status: Home / Self Care | Payer: Medicare Other | Source: Ambulatory Visit | Attending: Oncology | Admitting: Oncology

## 2015-04-01 DIAGNOSIS — Z79899 Other long term (current) drug therapy: Secondary | ICD-10-CM | POA: Insufficient documentation

## 2015-04-01 DIAGNOSIS — E785 Hyperlipidemia, unspecified: Secondary | ICD-10-CM | POA: Diagnosis not present

## 2015-04-01 DIAGNOSIS — R1310 Dysphagia, unspecified: Secondary | ICD-10-CM | POA: Diagnosis not present

## 2015-04-01 DIAGNOSIS — Z87891 Personal history of nicotine dependence: Secondary | ICD-10-CM | POA: Insufficient documentation

## 2015-04-01 DIAGNOSIS — I69354 Hemiplegia and hemiparesis following cerebral infarction affecting left non-dominant side: Secondary | ICD-10-CM | POA: Insufficient documentation

## 2015-04-01 DIAGNOSIS — I10 Essential (primary) hypertension: Secondary | ICD-10-CM | POA: Insufficient documentation

## 2015-04-01 DIAGNOSIS — R634 Abnormal weight loss: Secondary | ICD-10-CM | POA: Diagnosis not present

## 2015-04-01 DIAGNOSIS — C159 Malignant neoplasm of esophagus, unspecified: Secondary | ICD-10-CM

## 2015-04-01 LAB — CBC WITH DIFFERENTIAL/PLATELET
BASOS ABS: 0 10*3/uL (ref 0.0–0.1)
BASOS PCT: 0 %
Eosinophils Absolute: 0.1 10*3/uL (ref 0.0–0.7)
Eosinophils Relative: 1 %
HEMATOCRIT: 36.7 % — AB (ref 39.0–52.0)
HEMOGLOBIN: 12.1 g/dL — AB (ref 13.0–17.0)
LYMPHS PCT: 16 %
Lymphs Abs: 0.9 10*3/uL (ref 0.7–4.0)
MCH: 31.4 pg (ref 26.0–34.0)
MCHC: 33 g/dL (ref 30.0–36.0)
MCV: 95.3 fL (ref 78.0–100.0)
Monocytes Absolute: 0.3 10*3/uL (ref 0.1–1.0)
Monocytes Relative: 5 %
NEUTROS ABS: 4.3 10*3/uL (ref 1.7–7.7)
NEUTROS PCT: 78 %
Platelets: 210 10*3/uL (ref 150–400)
RBC: 3.85 MIL/uL — AB (ref 4.22–5.81)
RDW: 12.8 % (ref 11.5–15.5)
WBC: 5.6 10*3/uL (ref 4.0–10.5)

## 2015-04-01 LAB — BASIC METABOLIC PANEL
ANION GAP: 8 (ref 5–15)
BUN: 22 mg/dL — ABNORMAL HIGH (ref 6–20)
CALCIUM: 9.3 mg/dL (ref 8.9–10.3)
CO2: 24 mmol/L (ref 22–32)
Chloride: 105 mmol/L (ref 101–111)
Creatinine, Ser: 1.12 mg/dL (ref 0.61–1.24)
Glucose, Bld: 104 mg/dL — ABNORMAL HIGH (ref 65–99)
POTASSIUM: 4.3 mmol/L (ref 3.5–5.1)
Sodium: 137 mmol/L (ref 135–145)

## 2015-04-01 LAB — PROTIME-INR
INR: 1.16 (ref 0.00–1.49)
PROTHROMBIN TIME: 14.5 s (ref 11.6–15.2)

## 2015-04-01 MED ORDER — GLUCAGON HCL RDNA (DIAGNOSTIC) 1 MG IJ SOLR
INTRAMUSCULAR | Status: AC
  Filled 2015-04-01: qty 1

## 2015-04-01 MED ORDER — LIDOCAINE-EPINEPHRINE 2 %-1:100000 IJ SOLN
INTRAMUSCULAR | Status: AC
  Filled 2015-04-01: qty 1

## 2015-04-01 MED ORDER — CEFAZOLIN SODIUM-DEXTROSE 2-4 GM/100ML-% IV SOLN: 2.0000 g | Freq: Three times a day (TID) | INTRAVENOUS | Status: DC

## 2015-04-01 MED ORDER — DEXTROSE 5 % IV SOLN
2.0000 g | INTRAVENOUS | Status: DC
  Filled 2015-04-01: qty 20

## 2015-04-01 MED ORDER — GLUCAGON HCL RDNA (DIAGNOSTIC) 1 MG IJ SOLR
INTRAMUSCULAR | Status: AC | PRN
  Administered 2015-04-01: 1 mg via INTRAVENOUS

## 2015-04-01 MED ORDER — CEFAZOLIN SODIUM-DEXTROSE 2-4 GM/100ML-% IV SOLN
2.0000 g | Freq: Once | INTRAVENOUS | Status: AC
  Administered 2015-04-01: 2 g via INTRAVENOUS

## 2015-04-01 MED ORDER — LIDOCAINE HCL 1 % IJ SOLN
INTRAMUSCULAR | Status: DC | PRN
  Administered 2015-04-01: 10 mL

## 2015-04-01 MED ORDER — MIDAZOLAM HCL 2 MG/2ML IJ SOLN
INTRAMUSCULAR | Status: AC | PRN
  Administered 2015-04-01 (×2): 1 mg via INTRAVENOUS

## 2015-04-01 MED ORDER — FENTANYL CITRATE (PF) 100 MCG/2ML IJ SOLN
INTRAMUSCULAR | Status: AC
  Filled 2015-04-01: qty 4

## 2015-04-01 MED ORDER — FENTANYL CITRATE (PF) 100 MCG/2ML IJ SOLN
INTRAMUSCULAR | Status: AC | PRN
  Administered 2015-04-01: 50 ug via INTRAVENOUS

## 2015-04-01 MED ORDER — MIDAZOLAM HCL 2 MG/2ML IJ SOLN
INTRAMUSCULAR | Status: AC
  Filled 2015-04-01: qty 6

## 2015-04-01 MED ORDER — HEPARIN SOD (PORK) LOCK FLUSH 100 UNIT/ML IV SOLN
500.0000 [IU] | INTRAVENOUS | Status: AC | PRN
  Administered 2015-04-01: 500 [IU]
  Filled 2015-04-01: qty 5

## 2015-04-01 MED ORDER — IOHEXOL 300 MG/ML  SOLN
50.0000 mL | Freq: Once | INTRAMUSCULAR | Status: AC | PRN
  Administered 2015-04-01: 10 mL

## 2015-04-01 MED ORDER — SODIUM CHLORIDE 0.9 % IV SOLN: INTRAVENOUS | Status: DC

## 2015-04-01 NOTE — Progress Notes (Signed)
Called [facility] & gave report to [facility] RN re: pts d/c instructions. [facility] transport notified.

## 2015-04-01 NOTE — Discharge Instructions (Signed)
Gastrostomy Tube Home Guide, Adult °A gastrostomy tube is a tube that is surgically placed into the stomach. It is also called a "G-tube." G-tubes are used when a person is unable to eat and drink enough on their own to stay healthy. The tube is inserted into the stomach through a small cut (incision) in the skin. This tube is used for: °· Feeding. °· Giving medication. °GASTROSTOMY TUBE CARE °· Wash your hands with soap and water. °· Remove the old dressing (if any). Some styles of G-tubes may need a dressing inserted between the skin and the G-tube. Other types of G-tubes do not require a dressing. Ask your health care provider if a dressing is needed. °· Check the area where the tube enters the skin (insertion site) for redness, swelling, or pus-like (purulent) drainage. A small amount of clear or tan liquid drainage is normal. Check to make sure scar tissue (skin) is not growing around the insertion site. This could have a raised, bumpy appearance. °· A cotton swab can be used to clean the skin around the tube: °¨ When the G-tube is first put in, a normal saline solution or water can be used to clean the skin. °¨ Mild soap and warm water can be used when the skin around the G-tube site has healed. °¨ Roll the cotton swab around the G-tube insertion site to remove any drainage or crusting at the insertion site. °STOMACH RESIDUALS °Feeding tube residuals are the amount of liquids that are in the stomach at any given time. Residuals may be checked before giving feedings, medications, or as instructed by your health care provider. °· Ask your health care provider if there are instances when you would not start tube feedings depending on the amount or type of contents withdrawn from the stomach. °· Check residuals by attaching a syringe to the G-tube and pulling back on the syringe plunger. Note the amount, and return the residual back into the stomach. °FLUSHING THE G-TUBE °· The G-tube should be periodically  flushed with clean warm water to keep it from clogging. °¨ Flush the G-tube after feedings or medications. Draw up 30 mL of warm water in a syringe. Connect the syringe to the G-tube and slowly push the water into the tube. °¨ Do not push feedings, medications, or flushes rapidly. Flush the G-tube gently and slowly. °¨ Only use syringes made for G-tubes to flush medications or feedings. °¨ Your health care provider may want the G-tube flushed more often or with more water. If this is the case, follow your health care provider's instructions. °FEEDINGS °Your health care provider will determine whether feedings are given as a bolus (a certain amount given at one time and at scheduled times) or whether feedings will be given continuously on a feeding pump.  °· Formulas should be given at room temperature. °· If feedings are continuous, no more than 4 hours worth of feedings should be placed in the feeding bag. This helps prevent spoilage or accidental excess infusion. °· Cover and place unused formula in the refrigerator. °· If feedings are continuous, stop the feedings when medications or flushes are given. Be sure to restart the feedings. °· Feeding bags and syringes should be replaced as instructed by your health care provider. °GIVING MEDICATION  °· In general, it is best if all medications are in a liquid form for G-tube administration. Liquid medications are less likely to clog the G-tube. °¨ Mix the liquid medication with 30 mL (or amount recommended by   your health care provider) of warm water. °¨ Draw up the medication into the syringe. °¨ Attach the syringe to the G-tube and slowly push the mixture into the G-tube. °¨ After giving the medication, draw up 30 mL of warm water in the syringe and slowly flush the G-tube. °· For pills or capsules, check with your health care provider first before crushing medications. Some pills are not effective if they are crushed. Some capsules are sustained-release  medications. °¨ If appropriate, crush the pill or capsule and mix with 30 mL of warm water. Using the syringe, slowly push the medication through the tube, then flush the tube with another 30 mL of tap water. °G-TUBE PROBLEMS °G-tube was pulled out. °· Cause: May have been pulled out accidentally. °· Solutions: Cover the opening with clean dressing and tape. Call your health care provider right away. The G-tube should be put in as soon as possible (within 4 hours) so the G-tube opening (tract) does not close. The G-tube needs to be put in at a health care setting. An X-ray needs to be done to confirm placement before the G-tube can be used again. °Redness, irritation, soreness, or foul odor around the gastrostomy site. °· Cause: May be caused by leakage or infection. °· Solutions: Call your health care provider right away. °Large amount of leakage of fluid or mucus-like liquid present (a large amount means it soaks clothing). °· Cause: Many reasons could cause the G-tube to leak. °· Solutions: Call your health care provider to discuss the amount of leakage. °Skin or scar tissue appears to be growing where tube enters skin.  °· Cause: Tissue growth may develop around the insertion site if the G-tube is moved or pulled on excessively. °· Solutions: Secure tube with tape so that excess movement does not occur. Call your health care provider. °G-tube is clogged. °· Cause: Thick formula or medication. °· Solutions: Try to slowly push warm water into the tube with a large syringe. Never try to push any object into the tube to unclog it. Do not force fluid into the G-tube. If you are unable to unclog the tube, call your health care provider right away. °TIPS °· Head of bed (HOB) position refers to the upright position of a person's upper body. °¨ When giving medications or a feeding bolus, keep the HOB up as told by your health care provider. Do this during the feeding and for 1 hour after the feeding or medication  administration. °¨ If continuous feedings are being given, it is best to keep the HOB up as told by your health care provider. When ADLs (activities of daily living) are performed and the HOB needs to be flat, be sure to turn the feeding pump off. Restart the feeding pump when the HOB is returned to the recommended height. °· Do not pull or put tension on the tube. °· To prevent fluid backflow, kink the G-tube before removing the cap or disconnecting a syringe. °· Check the G-tube length every day. Measure from the insertion site to the end of the G-tube. If the length is longer than previous measurements, the tube may be coming out. Call your health care provider if you notice increasing G-tube length. °· Oral care, such as brushing teeth, must be continued. °· You may need to remove excess air (vent) from the G-tube. Your health care provider will tell you if this is needed. °· Always call your health care provider if you have questions or problems with the   G-tube. SEEK IMMEDIATE MEDICAL CARE IF:   You have severe abdominal pain, tenderness, or abdominal bloating (distension).  You have nausea or vomiting.  You are constipated or have problems moving your bowels.  The G-tube insertion site is red, swollen, has a foul smell, or has yellow or brown drainage.  You have difficulty breathing or shortness of breath.  You have a fever.  You have a large amount of feeding tube residuals.  The G-tube is clogged and cannot be flushed. MAKE SURE YOU:   Understand these instructions.  Will watch your condition.  Will get help right away if you are not doing well or get worse.   This information is not intended to replace advice given to you by your health care provider. Make sure you discuss any questions you have with your health care provider.   Document Released: 03/07/2001 Document Revised: 05/13/2014 Document Reviewed: 09/03/2012 Elsevier Interactive Patient Education 2016 Elsevier  Inc. Moderate Conscious Sedation, Adult Sedation is the use of medicines to promote relaxation and relieve discomfort and anxiety. Moderate conscious sedation is a type of sedation. Under moderate conscious sedation you are less alert than normal but are still able to respond to instructions or stimulation. Moderate conscious sedation is used during short medical and dental procedures. It is milder than deep sedation or general anesthesia and allows you to return to your regular activities sooner. LET [facility] CARE PROVIDER KNOW ABOUT:   Any allergies you have.  All medicines you are taking, including vitamins, herbs, eye drops, creams, and over-the-counter medicines.  Use of steroids (by mouth or creams).  Previous problems you or members of your family have had with the use of anesthetics.  Any blood disorders you have.  Previous surgeries you have had.  Medical conditions you have.  Possibility of pregnancy, if this applies.  Use of cigarettes, alcohol, or illegal drugs. RISKS AND COMPLICATIONS Generally, this is a safe procedure. However, as with any procedure, problems can occur. Possible problems include:  Oversedation.  Trouble breathing on your own. You may need to have a breathing tube until you are awake and breathing on your own.  Allergic reaction to any of the medicines used for the procedure. BEFORE THE PROCEDURE  You may have blood tests done. These tests can help show how well your kidneys and liver are working. They can also show how well your blood clots.  A physical exam will be done.  Only take medicines as directed by your health care provider. You may need to stop taking medicines (such as blood thinners, aspirin, or nonsteroidal anti-inflammatory drugs) before the procedure.   Do not eat or drink at least 6 hours before the procedure or as directed by your health care provider.  Arrange for a responsible adult, family member, or friend to take you  home after the procedure. He or she should stay with you for at least 24 hours after the procedure, until the medicine has worn off. PROCEDURE   An intravenous (IV) catheter will be inserted into one of your veins. Medicine will be able to flow directly into your body through this catheter. You may be given medicine through this tube to help prevent pain and help you relax.  The medical or dental procedure will be done. AFTER THE PROCEDURE  You will stay in a recovery area until the medicine has worn off. Your blood pressure and pulse will be checked.   Depending on the procedure you had, you may be allowed  to go home when you can tolerate liquids and your pain is under control.   This information is not intended to replace advice given to you by your health care provider. Make sure you discuss any questions you have with your health care provider.   Document Released: 09/21/2000 Document Revised: 01/17/2014 Document Reviewed: 09/03/2012 Elsevier Interactive Patient Education [employer].

## 2015-04-01 NOTE — Procedures (Signed)
Successful fluoroscopic guided insertion of gastrostomy tube.   The gastrostomy tube may be used immediately for medications.   Tube feeds may be initiated in 24 hours as per the primary team.   EBL: Minimal No immediate post procedural complications.   Jay Darianny Momon, MD Pager #: [phone]    

## 2015-04-01 NOTE — Progress Notes (Signed)
Called [facility] to verify medications pt received in past 24 hrs. See PTA med documentation. Stacey Drain

## 2015-04-01 NOTE — H&P (Signed)
Chief Complaint: Patient was seen in consultation today for percutaneous gastrostomy tube placement  Referring Physician(s): [facility]  Supervising Physician:  Dr. Ronny Bacon  History of Present Illness: Billy Carson is a 70 y.o. male with past medical history of hypertension, hyperlipidemia, prior stroke with left-sided weakness and newly diagnosed squamous cell carcinoma of the esophagus who presents today for gastrostomy tube placement. He has had some dysphagia and wt loss, but is still eating at this point. PMHx, meds, labs, imaging reviewed. Has been NPO since about 0630 this am. He did drink thin barium last pm as directed.  Past Medical History  Diagnosis Date  . Hypertension   . Hyperlipidemia   . Stroke [facility])     left sided weakness  . Squamous cell carcinoma of esophagus ([place]) 02/26/2015    Past Surgical History  Procedure Laterality Date  . Tonsillectomy    . Cataract extraction w/phaco Left 10/14/2013    Procedure: CATARACT EXTRACTION PHACO AND INTRAOCULAR LENS PLACEMENT LEFT EYE CDE=11.04;  Surgeon: Williams Che, MD;  Location: [facility] ORS;  Service: Ophthalmology;  Laterality: Left;  . Cataract extraction w/phaco Right 12/30/2013    Procedure: CATARACT EXTRACTION PHACO AND INTRAOCULAR LENS PLACEMENT; CDE:  110.53;  Surgeon: Williams Che, MD;  Location: [facility];  Service: Ophthalmology;  Laterality: Right;  . Colonoscopy with propofol N/A 02/10/2015    Procedure: COLONOSCOPY WITH PROPOFOL;  Surgeon: Danie Binder, MD;  Location: [facility];  Service: Endoscopy;  Laterality: N/A;  1030  . Esophagogastroduodenoscopy (egd) with propofol N/A 02/10/2015    Procedure: ESOPHAGOGASTRODUODENOSCOPY (EGD) WITH PROPOFOL;  Surgeon: Danie Binder, MD;  Location: [facility];  Service: Endoscopy;  Laterality: N/A;  . Polypectomy N/A 02/10/2015    Procedure: POLYPECTOMY;  Surgeon: Danie Binder, MD;  Location: [facility];  Service: Endoscopy;   Laterality: N/A;  cecum x 1 and ascending colon polyp x1, Medium sigmoid colon polyp, Medium and Large sigmoid colon polyps  . Biopsy N/A 02/10/2015    Procedure: BIOPSY;  Surgeon: Danie Binder, MD;  Location: [facility];  Service: Endoscopy;  Laterality: N/A;  Esophageal mass biopsies, Gastric biopsies  . Eus N/A 03/05/2015    Procedure: UPPER ENDOSCOPIC ULTRASOUND (EUS) RADIAL;  Surgeon: Milus Banister, MD;  Location: [facility] ENDOS[facility];  Service: Endoscopy;  Laterality: N/A;  . Portacath placement  03/11/2015    Allergies: Review of patient's allergies indicates no known allergies.  Medications: Prior to Admission medications   Medication Sig Start Date End Date Taking? Authorizing Provider  atorvastatin (LIPITOR) 10 MG tablet Take 10 mg by mouth daily at 6 PM.  11/26/14  Yes Historical Provider, MD  ENSURE PLUS (ENSURE PLUS) LIQD Take 237 mLs by mouth 2 (two) times daily.   Yes Historical Provider, MD  folic acid (FOLVITE) [id number] MCG tablet Take 400 mcg by mouth daily.   Yes Historical Provider, MD  lisinopril (PRINIVIL,ZESTRIL) 10 MG tablet Take 10 mg by mouth daily.   Yes Historical Provider, MD  metoprolol tartrate (LOPRESSOR) 25 MG tablet Take 12.5 mg by mouth 2 (two) times daily.   Yes Historical Provider, MD  Multiple Vitamin (MULTIVITAMIN WITH MINERALS) TABS tablet Take 1 tablet by mouth daily. 09/11/12  Yes Costin Karlyne Greenspan, MD  senna-docusate (SENOKOT-S) 8.6-50 MG per tablet Take 1 tablet by mouth daily.   Yes Historical Provider, MD  Vitamin D, Ergocalciferol, (DRISDOL) 50000 units CAPS capsule Take 50,000 Units by mouth every 30 (thirty)  days.   Yes Historical Provider, MD  cyanocobalamin (,VITAMIN B-12,) 1000 MCG/ML injection Inject 1,000 mcg into the muscle every 30 (thirty) days.    Historical Provider, MD  hydrALAZINE (APRESOLINE) 10 MG tablet Take 1 tablet (10 mg total) by mouth every 8 (eight) hours. 09/11/12   Costin Karlyne Greenspan, MD  promethazine (PHENERGAN) 25 MG tablet Take 25 mg  by mouth every 6 (six) hours as needed for nausea or vomiting.    Historical Provider, MD  PROMETHAZINE HCL IJ Inject 12.5 mg as directed every 6 (six) hours as needed (Nausea and vomitting).    Historical Provider, MD     Family History  Problem Relation Age of Onset  . Colon cancer Neg Hx     Social History   Social History  . Marital Status: Married    Spouse Name: N/A  . Number of Children: N/A  . Years of Education: N/A   Social History Main Topics  . Smoking status: Former Smoker -- 1.00 packs/day for 20 years    Types: Cigarettes    Quit date: 10/03/2012  . Smokeless tobacco: Never Used  . Alcohol Use: No     Comment: Denies current ETOH since 12/2013; Previously 1 pint per day per pt 12/24/2013 x 8-10 years.  . Drug Use: No  . Sexual Activity: No   Other Topics Concern  . None   Social History Narrative      Review of Systems  Constitutional: Negative for fever and chills.  HENT: Positive for trouble swallowing.   Respiratory: Negative for cough and shortness of breath.   Cardiovascular: Negative for chest pain.  Gastrointestinal: Negative for nausea, vomiting, abdominal pain and blood in stool.  Genitourinary: Negative for dysuria and hematuria.  Musculoskeletal: Negative for back pain.  Neurological: Negative for headaches.    Vital Signs: There were no vitals taken for this visit.  Physical Exam  Constitutional: He is oriented to person, place, and time. He appears well-developed and well-nourished.  Cardiovascular: Normal rate and regular rhythm.   Pulmonary/Chest: Effort normal.  (R)chest port intact, accessed. Incision c/d/i  Abdominal: Soft. Bowel sounds are normal. There is no tenderness.  Musculoskeletal:  Mild left sided weakness noted  Neurological: He is alert and oriented to person, place, and time.  Psychiatric: He has a normal mood and affect. Judgment normal.    Mallampati Score:  MD Evaluation Airway: WNL Heart: WNL Abdomen:  WNL Chest/ Lungs: WNL ASA  Classification: 3 Mallampati/Airway Score: Two  Imaging: Ir Fluoro Guide Cv Line Right  03/11/2015  CLINICAL DATA:  Esophageal carcinoma, needs long-term venous access for chemotherapy. EXAM: TUNNELED PORT CATHETER PLACEMENT WITH ULTRASOUND AND FLUOROSCOPIC GUIDANCE FLUOROSCOPY TIME:  54 seconds, 8 mGy ANESTHESIA/SEDATION: Intravenous Fentanyl and Versed were administered as conscious sedation during continuous monitoring of the patient's level of consciousness and physiological / cardiorespiratory status by the radiology RN, with a total moderate sedation time of 15 minutes. TECHNIQUE: The procedure, risks, benefits, and alternatives were explained to the patient. Questions regarding the procedure were encouraged and answered. The patient understands and consents to the procedure. As antibiotic prophylaxis, cefazolin 2 g was ordered pre-procedure and administered intravenously within one hour of incision. Patency of the right IJ vein was confirmed with ultrasound with image documentation. An appropriate skin site was determined. Skin site was marked. Region was prepped using maximum barrier technique including cap and mask, sterile gown, sterile gloves, large sterile sheet, and Chlorhexidine as cutaneous antisepsis. The region was infiltrated locally with  1% lidocaine. Under real-time ultrasound guidance, the right IJ vein was accessed with a 21 gauge micropuncture needle; the needle tip within the vein was confirmed with ultrasound image documentation. Needle was exchanged over a 018 guidewire for transitional dilator which allowed passage of the [facility] wire into the IVC. Over this, the transitional dilator was exchanged for a 5 [place] MPA catheter. A small incision was made on the right anterior chest wall and a subcutaneous pocket fashioned. The power-injectable port was positioned and its catheter tunneled to the right IJ dermatotomy site. The MPA catheter was exchanged over an  Amplatz wire for a peel-away sheath, through which the port catheter, which had been trimmed to the appropriate length, was advanced and positioned under fluoroscopy with its tip at the cavoatrial junction. Spot chest radiograph confirms good catheter position and no pneumothorax. The pocket was closed with deep interrupted and subcuticular continuous 3-0 Monocryl sutures. The port was flushed per protocol. The incisions were covered with Dermabond then covered with a sterile dressing. COMPLICATIONS: COMPLICATIONS None immediate IMPRESSION: Technically successful right IJ power-injectable port catheter placement. Ready for routine use. Electronically Signed   By: Lucrezia Europe M.D.   On: 03/11/2015 15:08   Ir US Guide Vasc Access Right  03/11/2015  CLINICAL DATA:  Esophageal carcinoma, needs long-term venous access for chemotherapy. EXAM: TUNNELED PORT CATHETER PLACEMENT WITH ULTRASOUND AND FLUOROSCOPIC GUIDANCE FLUOROSCOPY TIME:  54 seconds, 8 mGy ANESTHESIA/SEDATION: Intravenous Fentanyl and Versed were administered as conscious sedation during continuous monitoring of the patient's level of consciousness and physiological / cardiorespiratory status by the radiology RN, with a total moderate sedation time of 15 minutes. TECHNIQUE: The procedure, risks, benefits, and alternatives were explained to the patient. Questions regarding the procedure were encouraged and answered. The patient understands and consents to the procedure. As antibiotic prophylaxis, cefazolin 2 g was ordered pre-procedure and administered intravenously within one hour of incision. Patency of the right IJ vein was confirmed with ultrasound with image documentation. An appropriate skin site was determined. Skin site was marked. Region was prepped using maximum barrier technique including cap and mask, sterile gown, sterile gloves, large sterile sheet, and Chlorhexidine as cutaneous antisepsis. The region was infiltrated locally with 1% lidocaine.  Under real-time ultrasound guidance, the right IJ vein was accessed with a 21 gauge micropuncture needle; the needle tip within the vein was confirmed with ultrasound image documentation. Needle was exchanged over a 018 guidewire for transitional dilator which allowed passage of the [facility] wire into the IVC. Over this, the transitional dilator was exchanged for a 5 [place] MPA catheter. A small incision was made on the right anterior chest wall and a subcutaneous pocket fashioned. The power-injectable port was positioned and its catheter tunneled to the right IJ dermatotomy site. The MPA catheter was exchanged over an Amplatz wire for a peel-away sheath, through which the port catheter, which had been trimmed to the appropriate length, was advanced and positioned under fluoroscopy with its tip at the cavoatrial junction. Spot chest radiograph confirms good catheter position and no pneumothorax. The pocket was closed with deep interrupted and subcuticular continuous 3-0 Monocryl sutures. The port was flushed per protocol. The incisions were covered with Dermabond then covered with a sterile dressing. COMPLICATIONS: COMPLICATIONS None immediate IMPRESSION: Technically successful right IJ power-injectable port catheter placement. Ready for routine use. Electronically Signed   By: Lucrezia Europe M.D.   On: 03/11/2015 15:08    Labs:  CBC:  Recent Labs  01/16/15 1330 03/11/15 1010 03/27/15  [id number] 04/01/15 1330  WBC 7.9 7.8 7.5 5.6  HGB 12.9* 12.4* 12.9* 12.1*  HCT 38.8* 37.7* 38.9* 36.7*  PLT 218 207 182 210    COAGS:  Recent Labs  03/11/15 1010 04/01/15 1330  INR 1.04 1.16  APTT 27  --     BMP:  Recent Labs  01/16/15 1330 03/27/15 0946  NA 139 139  K 4.3 4.2  CL 105 104  CO2 26 27  GLUCOSE 109* 104*  BUN 18 16  CALCIUM 9.5 9.5  CREATININE 1.36* 1.29*  GFRNONAA 51* 54*  GFRAA 59* >60    LIVER FUNCTION TESTS:  Recent Labs  03/27/15 0946  BILITOT 0.7  AST 19  ALT 14*  ALKPHOS  87  PROT 7.8  ALBUMIN 4.1    Assessment and Plan: Esophageal cancer Dysphagia, wt loss For G-tube placement Labs ok Risks and Benefits discussed with the patient including, but not limited to the need for a barium enema during the procedure, bleeding, infection, peritonitis, or damage to adjacent structures. All of the patient's questions were answered, patient is agreeable to proceed. Consent signed and in chart.    Thank you for this interesting consult.  I greatly enjoyed meeting MESHACH NEZAT and look forward to participating in their care.  A copy of this report was sent to the requesting provider on this date.  Electronically Signed: Ascencion Dike 04/01/2015, 2:09 PM   I spent a total of 15 minutes in face to face in clinical consultation, greater than 50% of which was counseling/coordinating care for percutaneous gastrostomy tube placement

## 2015-04-03 ENCOUNTER — Encounter: Payer: Self-pay | Admitting: Dietician

## 2015-04-03 ENCOUNTER — Encounter (HOSPITAL_BASED_OUTPATIENT_CLINIC_OR_DEPARTMENT_OTHER): Payer: Medicare Other | Admitting: Oncology

## 2015-04-03 ENCOUNTER — Encounter (HOSPITAL_COMMUNITY): Payer: Self-pay | Admitting: Oncology

## 2015-04-03 ENCOUNTER — Encounter (HOSPITAL_BASED_OUTPATIENT_CLINIC_OR_DEPARTMENT_OTHER): Payer: Medicare Other

## 2015-04-03 VITALS — Wt 199.2 lb

## 2015-04-03 VITALS — BP 125/72 | HR 70 | Temp 98.6°F | Resp 18 | Wt 201.0 lb

## 2015-04-03 DIAGNOSIS — Z5111 Encounter for antineoplastic chemotherapy: Secondary | ICD-10-CM

## 2015-04-03 DIAGNOSIS — R634 Abnormal weight loss: Secondary | ICD-10-CM

## 2015-04-03 DIAGNOSIS — C159 Malignant neoplasm of esophagus, unspecified: Secondary | ICD-10-CM

## 2015-04-03 DIAGNOSIS — C154 Malignant neoplasm of middle third of esophagus: Secondary | ICD-10-CM

## 2015-04-03 LAB — CBC WITH DIFFERENTIAL/PLATELET
Basophils Absolute: 0 10*3/uL (ref 0.0–0.1)
Basophils Relative: 0 %
EOS PCT: 1 %
Eosinophils Absolute: 0.1 10*3/uL (ref 0.0–0.7)
HEMATOCRIT: 36 % — AB (ref 39.0–52.0)
Hemoglobin: 12 g/dL — ABNORMAL LOW (ref 13.0–17.0)
LYMPHS ABS: 0.7 10*3/uL (ref 0.7–4.0)
LYMPHS PCT: 10 %
MCH: 31.5 pg (ref 26.0–34.0)
MCHC: 33.3 g/dL (ref 30.0–36.0)
MCV: 94.5 fL (ref 78.0–100.0)
MONO ABS: 0.6 10*3/uL (ref 0.1–1.0)
Monocytes Relative: 8 %
NEUTROS ABS: 5.8 10*3/uL (ref 1.7–7.7)
Neutrophils Relative %: 81 %
PLATELETS: 201 10*3/uL (ref 150–400)
RBC: 3.81 MIL/uL — ABNORMAL LOW (ref 4.22–5.81)
RDW: 12.8 % (ref 11.5–15.5)
WBC: 7.2 10*3/uL (ref 4.0–10.5)

## 2015-04-03 LAB — COMPREHENSIVE METABOLIC PANEL
ALT: 15 U/L — AB (ref 17–63)
AST: 16 U/L (ref 15–41)
Albumin: 4.1 g/dL (ref 3.5–5.0)
Alkaline Phosphatase: 75 U/L (ref 38–126)
Anion gap: 8 (ref 5–15)
BILIRUBIN TOTAL: 0.7 mg/dL (ref 0.3–1.2)
BUN: 23 mg/dL — AB (ref 6–20)
CHLORIDE: 103 mmol/L (ref 101–111)
CO2: 25 mmol/L (ref 22–32)
CREATININE: 1.23 mg/dL (ref 0.61–1.24)
Calcium: 9.2 mg/dL (ref 8.9–10.3)
GFR, EST NON AFRICAN AMERICAN: 57 mL/min — AB (ref >60)
Glucose, Bld: 106 mg/dL — ABNORMAL HIGH (ref 65–99)
Potassium: 4.5 mmol/L (ref 3.5–5.1)
Sodium: 136 mmol/L (ref 135–145)
TOTAL PROTEIN: 7.7 g/dL (ref 6.5–8.1)

## 2015-04-03 MED ORDER — SODIUM CHLORIDE 0.9% FLUSH
10.0000 mL | INTRAVENOUS | Status: DC | PRN
  Administered 2015-04-03: 10 mL
  Filled 2015-04-03: qty 10

## 2015-04-03 MED ORDER — SODIUM CHLORIDE 0.9 % IV SOLN
Freq: Once | INTRAVENOUS | Status: AC
  Administered 2015-04-03: 12:00:00 via INTRAVENOUS

## 2015-04-03 MED ORDER — HEPARIN SOD (PORK) LOCK FLUSH 100 UNIT/ML IV SOLN
500.0000 [IU] | Freq: Once | INTRAVENOUS | Status: AC | PRN
  Administered 2015-04-03: 500 [IU]
  Filled 2015-04-03: qty 5

## 2015-04-03 MED ORDER — FAMOTIDINE IN NACL 20-0.9 MG/50ML-% IV SOLN
20.0000 mg | Freq: Once | INTRAVENOUS | Status: AC
  Administered 2015-04-03: 20 mg via INTRAVENOUS
  Filled 2015-04-03: qty 50

## 2015-04-03 MED ORDER — PALONOSETRON HCL INJECTION 0.25 MG/5ML
0.2500 mg | Freq: Once | INTRAVENOUS | Status: AC
  Administered 2015-04-03: 0.25 mg via INTRAVENOUS
  Filled 2015-04-03: qty 5

## 2015-04-03 MED ORDER — PACLITAXEL CHEMO INJECTION 300 MG/50ML
45.0000 mg/m2 | Freq: Once | INTRAVENOUS | Status: AC
  Administered 2015-04-03: 96 mg via INTRAVENOUS
  Filled 2015-04-03: qty 16

## 2015-04-03 MED ORDER — OSMOLITE 1.2 CAL PO LIQD: ORAL | Status: DC

## 2015-04-03 MED ORDER — SODIUM CHLORIDE 0.9 % IV SOLN
192.8000 mg | Freq: Once | INTRAVENOUS | Status: AC
  Administered 2015-04-03: 190 mg via INTRAVENOUS
  Filled 2015-04-03: qty 19

## 2015-04-03 MED ORDER — DIPHENHYDRAMINE HCL 50 MG/ML IJ SOLN
50.0000 mg | Freq: Once | INTRAMUSCULAR | Status: AC
  Administered 2015-04-03: 50 mg via INTRAVENOUS
  Filled 2015-04-03: qty 1

## 2015-04-03 MED ORDER — SODIUM CHLORIDE 0.9 % IV SOLN
20.0000 mg | Freq: Once | INTRAVENOUS | Status: AC
  Administered 2015-04-03: 20 mg via INTRAVENOUS
  Filled 2015-04-03: qty 2

## 2015-04-03 NOTE — Progress Notes (Signed)
Jani Gravel, MD [address] [place] [address]  Squamous cell carcinoma of esophagus [facility])  CURRENT THERAPY: Beginning concomitant chemoradiation with Carboplatin/Paclitaxel on 03/27/2015  INTERVAL HISTORY: Nikoli Groening Katayama 70 y.o. 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    Squamous cell carcinoma of esophagus ([place])   02/10/2015 Procedure Colonoscopy by Dr. Oneida Alar   02/10/2015 Pathology Results Colon, polyp(s), sigmoid - TRADITIONAL SERRATED ADENOMA (2.3 CM) WITH HIGH GRADE DYSPLASIA. - HIGH GRADE DYSPLASIA INVOLVES APPROXIMATELY 10 TO 15% OF THE POLYP. - CAUTERIZED EDGES ARE FREE OF HIGH GRADE DYSPLASIA BUT ARE INVOLVED WITH ADENOMATOUS CHAN   02/10/2015 Procedure EGD by Dr. Oneida Alar   02/10/2015 Pathology Results Esophagus, biopsy, mass - INVASIVE SQUAMOUS CELL CARCINOMA.   02/25/2015 PET scan Esophageal mass extends longitudinally over about 6 cm and is associated with very hypermetabolic activity. No definite separable adenopathy in the chest or other metastatic disease identified. Coarsened trabeculation with very faintly accentuated...   03/11/2015 Procedure Port placement by IR   03/23/2015 -  Radiation Therapy    03/27/2015 -  Chemotherapy Carboplaton/Paclitaxel weekly    04/01/2015 Procedure PEG placed by IR    I personally reviewed and went over laboratory results with the patient.  The results are noted within this dictation.  Labs satisfy treatment parameters today.  He denies any nausea/vomiting, diarrhea/constipation.  He notes a stable and strong appetite.  His weight continues to decline.  Burtis Junes, nutritionist, is working on [employer] and caloric needs for weight maintenance.  Additionally, he is working on who will be managing tube feeding ([place]).  He denies any complaints.    Past Medical History  Diagnosis Date  . Hypertension   . Hyperlipidemia   . Stroke [facility])     left sided weakness  . Squamous cell carcinoma of esophagus (HCC) 02/26/2015    has CVA (cerebral infarction); Tobacco abuse; Alcohol abuse; Hypertension; Encounter for screening colonoscopy; and Squamous cell carcinoma of esophagus ([place]) on his problem list.     has No Known Allergies.  Current Outpatient Prescriptions on File Prior to Visit  Medication Sig Dispense Refill  . atorvastatin (LIPITOR) 10 MG tablet Take 10 mg by mouth daily at 6 PM.     . CARBOPLATIN IV Inject into the vein. To be given once a week concurrently with XRT    . cyanocobalamin (,VITAMIN B-12,) 1000 MCG/ML injection Inject 1,000 mcg into the muscle every 30 (thirty) days.    . ENSURE PLUS (ENSURE PLUS) LIQD Take 237 mLs by mouth 2 (two) times daily.    . folic acid (FOLVITE) [id number] MCG tablet Take 400 mcg by mouth daily.    . hydrALAZINE (APRESOLINE) 10 MG tablet Take 1 tablet (10 mg total) by mouth every 8 (eight) hours. (Patient taking differently: Take 10 mg by mouth 2 (two) times daily. ) 90 tablet 1  . lidocaine-prilocaine (EMLA) cream Apply a quarter size amount to port site 1 hour prior to chemo. Do not rub in. Cover with plastic wrap. 30 g 3  . lisinopril (PRINIVIL,ZESTRIL) 10 MG tablet Take 10 mg by mouth daily.    . metoprolol tartrate (LOPRESSOR) 25 MG tablet Take 12.5 mg by mouth 2 (two) times daily.    . Multiple Vitamin (MULTIVITAMIN WITH MINERALS) TABS tablet  Take 1 tablet by mouth daily. 30 tablet 1  . ondansetron (ZOFRAN ODT) 8 MG disintegrating tablet Take 1 tablet (8 mg total) by mouth every 8 (eight) hours as needed for nausea or vomiting. 30 tablet 2  . PACLitaxel (TAXOL IV) Inject into the vein. To be given once a week concurrently with XRT    . polyethylene glycol (MIRALAX / GLYCOLAX) packet Take 17 g by mouth daily.    .  prochlorperazine (COMPAZINE) 10 MG tablet Take 1 tablet (10 mg total) by mouth every 6 (six) hours as needed for nausea or vomiting. 30 tablet 2  . promethazine (PHENERGAN) 25 MG tablet Take 12.5 mg by mouth every 6 (six) hours as needed for nausea or vomiting.     Marland Kitchen PROMETHAZINE HCL IJ Inject 12.5 mg as directed every 6 (six) hours as needed (Nausea and vomitting).    Marland Kitchen senna-docusate (SENOKOT-S) 8.6-50 MG per tablet Take 1 tablet by mouth daily.    . Vitamin D, Ergocalciferol, (DRISDOL) 50000 units CAPS capsule Take 50,000 Units by mouth every 30 (thirty) days.     Current Facility-Administered Medications on File Prior to Visit  Medication Dose Route Frequency Provider Last Rate Last Dose  . sodium chloride flush (NS) 0.9 % injection 10 mL  10 mL Intracatheter PRN Patrici Ranks, MD   10 mL at 04/03/15 1200    Past Surgical History  Procedure Laterality Date  . Tonsillectomy    . Cataract extraction w/phaco Left 10/14/2013    Procedure: CATARACT EXTRACTION PHACO AND INTRAOCULAR LENS PLACEMENT LEFT EYE CDE=11.04;  Surgeon: Williams Che, MD;  Location: [facility];  Service: Ophthalmology;  Laterality: Left;  . Cataract extraction w/phaco Right 12/30/2013    Procedure: CATARACT EXTRACTION PHACO AND INTRAOCULAR LENS PLACEMENT; CDE:  110.53;  Surgeon: Williams Che, MD;  Location: [facility];  Service: Ophthalmology;  Laterality: Right;  . Colonoscopy with propofol N/A 02/10/2015    Procedure: COLONOSCOPY WITH PROPOFOL;  Surgeon: Danie Binder, MD;  Location: [facility];  Service: Endoscopy;  Laterality: N/A;  1030  . Esophagogastroduodenoscopy (egd) with propofol N/A 02/10/2015    Procedure: ESOPHAGOGASTRODUODENOSCOPY (EGD) WITH PROPOFOL;  Surgeon: Danie Binder, MD;  Location: [facility];  Service: Endoscopy;  Laterality: N/A;  . Polypectomy N/A 02/10/2015    Procedure: POLYPECTOMY;  Surgeon: Danie Binder, MD;  Location: [facility];  Service: Endoscopy;  Laterality: N/A;  cecum x 1  and ascending colon polyp x1, Medium sigmoid colon polyp, Medium and Large sigmoid colon polyps  . Biopsy N/A 02/10/2015    Procedure: BIOPSY;  Surgeon: Danie Binder, MD;  Location: [facility];  Service: Endoscopy;  Laterality: N/A;  Esophageal mass biopsies, Gastric biopsies  . Eus N/A 03/05/2015    Procedure: UPPER ENDOSCOPIC ULTRASOUND (EUS) RADIAL;  Surgeon: Milus Banister, MD;  Location: [facility];  Service: Endoscopy;  Laterality: N/A;  . Portacath placement  03/11/2015    Denies any headaches, dizziness, double vision, fevers, chills, night sweats, nausea, vomiting, diarrhea, constipation, chest pain, heart palpitations, shortness of breath, blood in stool, black tarry stool, urinary pain, urinary burning, urinary frequency, hematuria.   PHYSICAL EXAMINATION  ECOG PERFORMANCE STATUS: 2 - Symptomatic, <50% confined to bed  There were no vitals filed for this visit.  Blood pressure 141/81 Pulse 81 Respirations 18 Temperature 97.72F Oxygen saturation 95% on room air. Weight: 199.2 lbs  GENERAL:alert, well nourished, well developed, comfortable, cooperative, smiling and in a chemotherapy recliner, port  accessed.  Unaccompanied. SKIN: skin color, texture, turgor are normal, no rashes or significant lesions HEAD: Normocephalic, No masses, lesions, tenderness or abnormalities EYES: normal, EOMI, Conjunctiva are pink and non-injected EARS: External ears normal OROPHARYNX:mucous membranes are moist  NECK: trachea midline, supple LYMPH:  not examined BREAST:not examined LUNGS: Clear to auscultation bilaterally without wheezes, rales, or rhonchi. HEART: Regular rate and rhythm without murmur, rub, or gallop. Normal S1 and S2. ABDOMEN: Positive bowel sounds in all 4 quadrants, nontender, soft.  No organomegaly noted. BACK: Back symmetric, no curvature. EXTREMITIES:less then 2 second capillary refill, no skin discoloration, no cyanosis  NEURO: alert & oriented x 3 with fluent  speech   LABORATORY DATA: CBC    Component Value Date/Time   WBC 7.2 04/03/2015 1109   RBC 3.81* 04/03/2015 1109   HGB 12.0* 04/03/2015 1109   HCT 36.0* 04/03/2015 1109   PLT 201 04/03/2015 1109   MCV 94.5 04/03/2015 1109   MCH 31.5 04/03/2015 1109   MCHC 33.3 04/03/2015 1109   RDW 12.8 04/03/2015 1109   LYMPHSABS 0.7 04/03/2015 1109   MONOABS 0.6 04/03/2015 1109   EOSABS 0.1 04/03/2015 1109   BASOSABS 0.0 04/03/2015 1109      Chemistry      Component Value Date/Time   NA 136 04/03/2015 1109   K 4.5 04/03/2015 1109   CL 103 04/03/2015 1109   CO2 25 04/03/2015 1109   BUN 23* 04/03/2015 1109   CREATININE 1.23 04/03/2015 1109      Component Value Date/Time   CALCIUM 9.2 04/03/2015 1109   ALKPHOS 75 04/03/2015 1109   AST 16 04/03/2015 1109   ALT 15* 04/03/2015 1109   BILITOT 0.7 04/03/2015 1109        PENDING LABS:   RADIOGRAPHIC STUDIES:  Ir Gastrostomy Tube Mod Sed  04/01/2015  INDICATION: History of esophageal cancer. In need of gastrostomy tube placement for enteric nutrition supplementation. EXAM: PULL TROUGH GASTROSTOMY TUBE PLACEMENT COMPARISON:  Radiation planning CT scan - 03/12/2015 MEDICATIONS: Ancef 2 gm IV; Antibiotics were administered within 1 hour of the procedure. Glucagon 1 mg IV CONTRAST:  10 mL of Omnipaque 300 administered into the gastric lumen. ANESTHESIA/SEDATION: Moderate (conscious) sedation was employed during this procedure. A total of Versed 2 mg and Fentanyl 75 mcg was administered intravenously. Moderate Sedation Time: 15 minutes. The patient's level of consciousness and vital signs were monitored continuously by radiology nursing throughout the procedure under my direct supervision. FLUOROSCOPY TIME:  3 minutes 36 seconds (55 mGy) COMPLICATIONS: None immediate. PROCEDURE: Informed written consent was obtained from the patient following explanation of the procedure, risks, benefits and alternatives. A time out was performed prior to the  initiation of the procedure. Ultrasound scanning was performed to demarcate the edge of the left lobe of the liver. Maximal barrier sterile technique utilized including caps, mask, sterile gowns, sterile gloves, large sterile drape, hand hygiene and Betadine prep. The left upper quadrant was sterilely prepped and draped. An oral gastric catheter was inserted into the stomach under fluoroscopy. The existing nasogastric feeding tube was removed. The left costal margin and air and barium opacified transverse colon were identified and avoided. Air was injected into the stomach for insufflation and visualization under fluoroscopy. Under sterile conditions a 17 gauge trocar needle was utilized to access the stomach percutaneously beneath the left subcostal margin after the overlying soft tissues were anesthetized with 1% Lidocaine with epinephrine. Needle position was confirmed within the stomach with aspiration of air and injection of small  amount of contrast. A single T tack was deployed for gastropexy. Over an Amplatz guide wire, a 9-French sheath was inserted into the stomach. A snare device was utilized to capture the oral gastric catheter. The snare device was pulled retrograde from the stomach up the esophagus and out the oropharynx. The 20-French pull-through gastrostomy was connected to the snare device and pulled antegrade through the oropharynx down the esophagus into the stomach and then through the percutaneous tract external to the patient. The gastrostomy was assembled externally. Contrast injection confirms position in the stomach. Several spot radiographic images were obtained in various obliquities for documentation. The patient tolerated procedure well without immediate post procedural complication. FINDINGS: After successful fluoroscopic guided placement, the gastrostomy tube is appropriately positioned with internal disc against the ventral aspect of the gastric lumen. IMPRESSION: Successful  fluoroscopic insertion of a 20-French pull-through gastrostomy tube. The gastrostomy may be used immediately for medication administration and in 24 hrs for the initiation of feeds. Electronically Signed   By: Sandi Mariscal M.D.   On: 04/01/2015 15:52   Ir Fluoro Guide Cv Line Right  03/11/2015  CLINICAL DATA:  Esophageal carcinoma, needs long-term venous access for chemotherapy. EXAM: TUNNELED PORT CATHETER PLACEMENT WITH ULTRASOUND AND FLUOROSCOPIC GUIDANCE FLUOROSCOPY TIME:  54 seconds, 8 mGy ANESTHESIA/SEDATION: Intravenous Fentanyl and Versed were administered as conscious sedation during continuous monitoring of the patient's level of consciousness and physiological / cardiorespiratory status by the radiology RN, with a total moderate sedation time of 15 minutes. TECHNIQUE: The procedure, risks, benefits, and alternatives were explained to the patient. Questions regarding the procedure were encouraged and answered. The patient understands and consents to the procedure. As antibiotic prophylaxis, cefazolin 2 g was ordered pre-procedure and administered intravenously within one hour of incision. Patency of the right IJ vein was confirmed with ultrasound with image documentation. An appropriate skin site was determined. Skin site was marked. Region was prepped using maximum barrier technique including cap and mask, sterile gown, sterile gloves, large sterile sheet, and Chlorhexidine as cutaneous antisepsis. The region was infiltrated locally with 1% lidocaine. Under real-time ultrasound guidance, the right IJ vein was accessed with a 21 gauge micropuncture needle; the needle tip within the vein was confirmed with ultrasound image documentation. Needle was exchanged over a 018 guidewire for transitional dilator which allowed passage of the [facility] wire into the IVC. Over this, the transitional dilator was exchanged for a 5 [place] MPA catheter. A small incision was made on the right anterior chest wall and a  subcutaneous pocket fashioned. The power-injectable port was positioned and its catheter tunneled to the right IJ dermatotomy site. The MPA catheter was exchanged over an Amplatz wire for a peel-away sheath, through which the port catheter, which had been trimmed to the appropriate length, was advanced and positioned under fluoroscopy with its tip at the cavoatrial junction. Spot chest radiograph confirms good catheter position and no pneumothorax. The pocket was closed with deep interrupted and subcuticular continuous 3-0 Monocryl sutures. The port was flushed per protocol. The incisions were covered with Dermabond then covered with a sterile dressing. COMPLICATIONS: COMPLICATIONS None immediate IMPRESSION: Technically successful right IJ power-injectable port catheter placement. Ready for routine use. Electronically Signed   By: Lucrezia Europe M.D.   On: 03/11/2015 15:08   Ir US Guide Vasc Access Right  03/11/2015  CLINICAL DATA:  Esophageal carcinoma, needs long-term venous access for chemotherapy. EXAM: TUNNELED PORT CATHETER PLACEMENT WITH ULTRASOUND AND FLUOROSCOPIC GUIDANCE FLUOROSCOPY TIME:  54 seconds, 8 mGy  ANESTHESIA/SEDATION: Intravenous Fentanyl and Versed were administered as conscious sedation during continuous monitoring of the patient's level of consciousness and physiological / cardiorespiratory status by the radiology RN, with a total moderate sedation time of 15 minutes. TECHNIQUE: The procedure, risks, benefits, and alternatives were explained to the patient. Questions regarding the procedure were encouraged and answered. The patient understands and consents to the procedure. As antibiotic prophylaxis, cefazolin 2 g was ordered pre-procedure and administered intravenously within one hour of incision. Patency of the right IJ vein was confirmed with ultrasound with image documentation. An appropriate skin site was determined. Skin site was marked. Region was prepped using maximum barrier technique  including cap and mask, sterile gown, sterile gloves, large sterile sheet, and Chlorhexidine as cutaneous antisepsis. The region was infiltrated locally with 1% lidocaine. Under real-time ultrasound guidance, the right IJ vein was accessed with a 21 gauge micropuncture needle; the needle tip within the vein was confirmed with ultrasound image documentation. Needle was exchanged over a 018 guidewire for transitional dilator which allowed passage of the [facility] wire into the IVC. Over this, the transitional dilator was exchanged for a 5 [place] MPA catheter. A small incision was made on the right anterior chest wall and a subcutaneous pocket fashioned. The power-injectable port was positioned and its catheter tunneled to the right IJ dermatotomy site. The MPA catheter was exchanged over an Amplatz wire for a peel-away sheath, through which the port catheter, which had been trimmed to the appropriate length, was advanced and positioned under fluoroscopy with its tip at the cavoatrial junction. Spot chest radiograph confirms good catheter position and no pneumothorax. The pocket was closed with deep interrupted and subcuticular continuous 3-0 Monocryl sutures. The port was flushed per protocol. The incisions were covered with Dermabond then covered with a sterile dressing. COMPLICATIONS: COMPLICATIONS None immediate IMPRESSION: Technically successful right IJ power-injectable port catheter placement. Ready for routine use. Electronically Signed   By: Lucrezia Europe M.D.   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  Oncology history updated.  PEG tube is in place.  He reports that he is not using PEG for food/supplementation.  He reports a continued strong appetite, but this is not  reflected in his serial weight checks.  He denies any nausea or vomiting.  Continued weight loss, weighing 199.2 lbs today.    Nutritionist consulted  To assist with feeding management via PEG.  Will await recommendations from Burtis Junes.  Pre-treatment labs today: CBC diff, CMET.  Labs satisfy treatment parameters today.  He is given my card with the clinic phone number in case he has any issues needing oncology assistance/management.  Return next week for follow-up and cycle #3.   THERAPY PLAN:  Treatment as outlined above.  All questions were answered. The patient knows to call the clinic with any problems, questions or concerns. We can certainly see the patient much sooner if necessary.  Patient and plan discussed with Dr. Ancil Linsey and she is in agreement with the aforementioned.   This note is electronically signed by: Doy Mince 04/03/2015 5:18 PM

## 2015-04-03 NOTE — Progress Notes (Signed)
70 y/o male PMHx significant for HTN, HLD, Stroke who was recently diagnosed with esophageal carcinoma. Has already begun concomitant chemoradiation. He is s/p Peg placement. Dietitian to assess and come up with TF regimen   Contacted Pt by Visiting during chemo  Wt Readings from Last 10 Encounters:  03/27/15 203 lb 9.6 oz (92.352 kg)  03/11/15 205 lb (92.987 kg)  03/06/15 205 lb 3.2 oz (93.078 kg)  03/05/15 209 lb (94.802 kg)  02/26/15 209 lb 4.8 oz (94.938 kg)  02/16/15 208 lb (94.348 kg)  01/16/15 247 lb (112.038 kg)  12/24/14 247 lb (112.038 kg)  10/14/13 201 lb (91.173 kg)  10/03/13 201 lb (91.173 kg)   Patient weight has been stable.  Patient reports oral intake as Good and is suffering from only very minor symptoms including a slight decrease in appetite and medication controlled constipation. He is edentulous, but is still able to eat cut up foods. He is currently residing at [facility] and states he eats 3 meals a day there. He also some times drinks their Ensure/Boost.   Discussed Tube feeding with patient. Explained different feeding types and formulas.   Emphasized that as long as patient is able to eat orally, he is encouraged to do so.He denies having any painful or difficulty swallowing at this time.  Exercising his swallow function is beneficial. However, did recommend beginning to use tube early to become fcomfortable with its use.  Pt seemed to be doing well. He did not have much to say and was overall very nonchalant. Was agreeable to everything RD said.   Most recent anthropometrics: Height: 5\' 10"   Weight: 203.6 lbs BMI: 29.3  Estimated nutritional requirements while undergoing chemoradiation: Kcals: 2000-2200 kcals (22-24 kcal/kg bw) Protein: 98-113 g Pro (1.3-1.5 g/kg bw) Fluid: >2 liters  Due to patients reported consistent constipation and perceived difficulty with adequate fluid intake, may best be served with a fiber free formula for now.  Supplemental TF  Regimen (Pt is eating 3 meals): 1 can Osmolite 1.2 4x daily to meet 50% estimated needs. Flush w/ 50 mls before and after each can for 400 extra ccs of fluid.    Provides: 1138 kcals  53 g Pro 780 ml fluid  If/when patients oral intake decreases. Can increase regimen. Will follow wt/pt report as he will frequently be coming in.    Went over proposed feeding plan with patient who was agreeable.   Left my contact info and handouts titled "Soft and Moist High Protein Menu Ideas" + "Feeding Tube Use and Care (Bolus/Syring Feedings)"  Burtis Junes RD, LDN Clinical Nutrition Pager: [phone] 04/03/2015 10:18 AM

## 2015-04-03 NOTE — Progress Notes (Signed)
Tolerated chemo well. Stable on discharge back to facility.

## 2015-04-03 NOTE — Assessment & Plan Note (Addendum)
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  Oncology history updated.  PEG tube is in place.  He reports that he is not using PEG for food/supplementation.  He reports a continued strong appetite, but this is not reflected in his serial weight checks.  He denies any nausea or vomiting.  Continued weight loss, weighing 199.2 lbs today.    Nutritionist consulted  To assist with feeding management via PEG.  Will await recommendations from Burtis Junes.  Pre-treatment labs today: CBC diff, CMET.  Labs satisfy treatment parameters today.  He is given my card with the clinic phone number in case he has any issues needing oncology assistance/management.  Return next week for follow-up and cycle #3.

## 2015-04-03 NOTE — Patient Instructions (Signed)
[facility] Discharge Instructions for Patients Receiving Chemotherapy   Beginning January 23rd 2017 lab work for the [facility] will be done in the  Main lab at [facility] on 1st floor. If you have a lab appointment with the [place] please come in thru the  Main Entrance and check in at the main information desk   Today you received the following chemotherapy agents Taxol and Carbo week 2.  To help prevent nausea and vomiting after your treatment, we encourage you to take your nausea medication as instructed.  If you develop nausea and vomiting, or diarrhea that is not controlled by your medication, call the clinic.  The clinic phone number is [phone]. Office hours are Monday-Friday 8:30am-5:00pm.  BELOW ARE SYMPTOMS THAT SHOULD BE REPORTED IMMEDIATELY:  *FEVER GREATER THAN 101.0 F  *CHILLS WITH OR WITHOUT FEVER  NAUSEA AND VOMITING THAT IS NOT CONTROLLED WITH YOUR NAUSEA MEDICATION  *UNUSUAL SHORTNESS OF BREATH  *UNUSUAL BRUISING OR BLEEDING  TENDERNESS IN MOUTH AND THROAT WITH OR WITHOUT PRESENCE OF ULCERS  *URINARY PROBLEMS  *BOWEL PROBLEMS  UNUSUAL RASH Items with * indicate a potential emergency and should be followed up as soon as possible. If you have an emergency after office hours please contact your primary care physician or go to the nearest emergency department.  Please call the clinic during office hours if you have any questions or concerns.   You may also contact the Patient Navigator at [phone] should you have any questions or need assistance in obtaining follow up care.   Resources For Cancer Patients and their Caregivers ? American Cancer Society: Can assist with transportation, wigs, general needs, runs Look Good Feel Better.        ([phone] ? Cancer Care: Provides financial assistance, online support groups, medication/co-pay assistance.  1-800-813-HOPE 484-601-6146) ? [facility]ists [place] cancer patients and their families through emotional , educational and financial support.  820-507-8327 ? [place] DSS Where to apply for food stamps, Medicaid and utility assistance. 510-352-0733 ? [employer]: Transportation to medical appointments. ([phone] ? Social Security Administration: May apply for disability if have a Stage IV cancer. [phone] ? [employer], Disability and Transit Services: Assists with nutrition, care and transit needs. [phone]

## 2015-04-10 ENCOUNTER — Encounter (HOSPITAL_BASED_OUTPATIENT_CLINIC_OR_DEPARTMENT_OTHER): Payer: Medicare Other | Admitting: Hematology & Oncology

## 2015-04-10 ENCOUNTER — Encounter (HOSPITAL_BASED_OUTPATIENT_CLINIC_OR_DEPARTMENT_OTHER): Payer: Medicare Other

## 2015-04-10 VITALS — BP 110/67 | HR 72 | Temp 98.6°F | Resp 18 | Wt 199.1 lb

## 2015-04-10 VITALS — BP 108/74 | HR 78 | Temp 98.3°F | Resp 16

## 2015-04-10 DIAGNOSIS — I639 Cerebral infarction, unspecified: Secondary | ICD-10-CM

## 2015-04-10 DIAGNOSIS — C159 Malignant neoplasm of esophagus, unspecified: Secondary | ICD-10-CM

## 2015-04-10 DIAGNOSIS — E875 Hyperkalemia: Secondary | ICD-10-CM

## 2015-04-10 DIAGNOSIS — Z5111 Encounter for antineoplastic chemotherapy: Secondary | ICD-10-CM | POA: Diagnosis not present

## 2015-04-10 LAB — COMPREHENSIVE METABOLIC PANEL
ALBUMIN: 4.2 g/dL (ref 3.5–5.0)
ALK PHOS: 79 U/L (ref 38–126)
ALT: 20 U/L (ref 17–63)
ANION GAP: 9 (ref 5–15)
AST: 22 U/L (ref 15–41)
BILIRUBIN TOTAL: 0.7 mg/dL (ref 0.3–1.2)
BUN: 20 mg/dL (ref 6–20)
CALCIUM: 9.3 mg/dL (ref 8.9–10.3)
CO2: 25 mmol/L (ref 22–32)
Chloride: 101 mmol/L (ref 101–111)
Creatinine, Ser: 1.34 mg/dL — ABNORMAL HIGH (ref 0.61–1.24)
GFR calc Af Amer: 59 mL/min — ABNORMAL LOW (ref >60)
GFR, EST NON AFRICAN AMERICAN: 51 mL/min — AB (ref >60)
GLUCOSE: 101 mg/dL — AB (ref 65–99)
POTASSIUM: 5.3 mmol/L — AB (ref 3.5–5.1)
Sodium: 135 mmol/L (ref 135–145)
TOTAL PROTEIN: 7.8 g/dL (ref 6.5–8.1)

## 2015-04-10 LAB — CBC WITH DIFFERENTIAL/PLATELET
Basophils Absolute: 0 10*3/uL (ref 0.0–0.1)
Basophils Relative: 0 %
Eosinophils Absolute: 0.1 10*3/uL (ref 0.0–0.7)
Eosinophils Relative: 1 %
HEMATOCRIT: 37.4 % — AB (ref 39.0–52.0)
HEMOGLOBIN: 12.7 g/dL — AB (ref 13.0–17.0)
LYMPHS ABS: 0.5 10*3/uL — AB (ref 0.7–4.0)
LYMPHS PCT: 10 %
MCH: 32.2 pg (ref 26.0–34.0)
MCHC: 34 g/dL (ref 30.0–36.0)
MCV: 94.7 fL (ref 78.0–100.0)
MONO ABS: 0.7 10*3/uL (ref 0.1–1.0)
MONOS PCT: 13 %
NEUTROS ABS: 3.8 10*3/uL (ref 1.7–7.7)
NEUTROS PCT: 76 %
Platelets: 193 10*3/uL (ref 150–400)
RBC: 3.95 MIL/uL — ABNORMAL LOW (ref 4.22–5.81)
RDW: 12.7 % (ref 11.5–15.5)
WBC: 5 10*3/uL (ref 4.0–10.5)

## 2015-04-10 MED ORDER — SODIUM CHLORIDE 0.9% FLUSH
10.0000 mL | INTRAVENOUS | Status: DC | PRN
  Administered 2015-04-10: 10 mL
  Filled 2015-04-10: qty 10

## 2015-04-10 MED ORDER — HEPARIN SOD (PORK) LOCK FLUSH 100 UNIT/ML IV SOLN
500.0000 [IU] | Freq: Once | INTRAVENOUS | Status: AC | PRN
  Administered 2015-04-10: 500 [IU]
  Filled 2015-04-10: qty 5

## 2015-04-10 MED ORDER — FAMOTIDINE IN NACL 20-0.9 MG/50ML-% IV SOLN
INTRAVENOUS | Status: AC
  Filled 2015-04-10: qty 50

## 2015-04-10 MED ORDER — PACLITAXEL CHEMO INJECTION 300 MG/50ML
45.0000 mg/m2 | Freq: Once | INTRAVENOUS | Status: AC
  Administered 2015-04-10: 96 mg via INTRAVENOUS
  Filled 2015-04-10: qty 16

## 2015-04-10 MED ORDER — SODIUM CHLORIDE 0.9 % IV SOLN
20.0000 mg | Freq: Once | INTRAVENOUS | Status: AC
  Administered 2015-04-10: 20 mg via INTRAVENOUS
  Filled 2015-04-10: qty 2

## 2015-04-10 MED ORDER — DIPHENHYDRAMINE HCL 50 MG/ML IJ SOLN
INTRAMUSCULAR | Status: AC
  Filled 2015-04-10: qty 1

## 2015-04-10 MED ORDER — PALONOSETRON HCL INJECTION 0.25 MG/5ML
INTRAVENOUS | Status: AC
  Filled 2015-04-10: qty 5

## 2015-04-10 MED ORDER — PALONOSETRON HCL INJECTION 0.25 MG/5ML
0.2500 mg | Freq: Once | INTRAVENOUS | Status: AC
  Administered 2015-04-10: 0.25 mg via INTRAVENOUS

## 2015-04-10 MED ORDER — DIPHENHYDRAMINE HCL 50 MG/ML IJ SOLN
50.0000 mg | Freq: Once | INTRAMUSCULAR | Status: AC
Start: 2015-04-10 — End: 2015-04-10
  Administered 2015-04-10: 50 mg via INTRAVENOUS

## 2015-04-10 MED ORDER — SODIUM CHLORIDE 0.9 % IV SOLN
Freq: Once | INTRAVENOUS | Status: AC
  Administered 2015-04-10: 12:00:00 via INTRAVENOUS

## 2015-04-10 MED ORDER — FAMOTIDINE IN NACL 20-0.9 MG/50ML-% IV SOLN
20.0000 mg | Freq: Once | INTRAVENOUS | Status: AC
  Administered 2015-04-10: 20 mg via INTRAVENOUS

## 2015-04-10 MED ORDER — CARBOPLATIN CHEMO INJECTION 450 MG/45ML
181.0000 mg | Freq: Once | INTRAVENOUS | Status: AC
  Administered 2015-04-10: 180 mg via INTRAVENOUS
  Filled 2015-04-10: qty 18

## 2015-04-10 NOTE — Progress Notes (Signed)
Kayexelate orders were given prior to patient leaving today. Willette Cluster was to be given @ Nursing Home. Tom notified.

## 2015-04-10 NOTE — Progress Notes (Signed)
Tolerated chemo well. Stable on discharge back to facility with caregiver via wheelchair.

## 2015-04-10 NOTE — Progress Notes (Signed)
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 male is here because of newly diagnosed squamous cell carcinoma of the esophagus.  He also has a sigmoid polyp with high grade dysplasia (Dr. Oneida Alar believes she removed it all). 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 He is still able to ambulate with the assistance of a walker or a cane. He lives at [facility] assisted living facility with his wife.   Mr. Dilmore returns to the [facility] alone today. He is in a wheelchair, but looks very good.  In general, the patient confirms this, saying "it's going pretty good."  He denies any pain. He also denies any vomiting. He says he had a sore throat yesterday afternoon, but that was it. He denies any diarrhea. He confirms sleeping.  He notes that he is using his feeding tube, during the day time mostly, and in the afternoon. He says that there is a pump on it, but it usually runs on its own.  When asked how radiation is going, he says "it's going good." Weight is reviewed. No fever or chills.   MEDICAL HISTORY:  Past Medical History  Diagnosis Date  . Hypertension   . Hyperlipidemia   . Stroke [facility])     left sided weakness  . Squamous cell carcinoma of  esophagus ([place]) 02/26/2015    SURGICAL HISTORY: Past Surgical History  Procedure Laterality Date  . Tonsillectomy    . Cataract extraction w/phaco Left 10/14/2013    Procedure: CATARACT EXTRACTION PHACO AND INTRAOCULAR LENS PLACEMENT LEFT EYE CDE=11.04;  Surgeon: Williams Che, MD;  Location: [facility];  Service: Ophthalmology;  Laterality: Left;  . Cataract extraction w/phaco Right 12/30/2013    Procedure: CATARACT EXTRACTION PHACO AND INTRAOCULAR LENS PLACEMENT; CDE:  110.53;  Surgeon: Williams Che, MD;  Location: [facility] OR[facility];  Service: Ophthalmology;  Laterality: Right;  . Colonoscopy with propofol N/A 02/10/2015    Procedure: COLONOSCOPY WITH PROPOFOL;  Surgeon: Danie Binder, MD;  Location: [facility];  Service: Endoscopy;  Laterality: N/A;  1030  . Esophagogastroduodenoscopy (egd) with propofol N/A 02/10/2015    Procedure: ESOPHAGOGASTRODUODENOSCOPY (EGD) WITH PROPOFOL;  Surgeon: Danie Binder, MD;  Location: [facility];  Service: Endoscopy;  Laterality: N/A;  . Polypectomy N/A 02/10/2015    Procedure: POLYPECTOMY;  Surgeon: Danie Binder, MD;  Location: [facility];  Service: Endoscopy;  Laterality: N/A;  cecum x 1 and ascending colon polyp x1, Medium sigmoid colon polyp, Medium and Large sigmoid colon polyps  . Biopsy N/A 02/10/2015    Procedure: BIOPSY;  Surgeon: Danie Binder, MD;  Location: [facility];  Service: Endoscopy;  Laterality: N/A;  Esophageal mass biopsies, Gastric biopsies  . Eus N/A 03/05/2015    Procedure: UPPER ENDOSCOPIC ULTRASOUND (EUS) RADIAL;  Surgeon: Milus Banister,  MD;  Location: [facility] END[facility];  Service: Endoscopy;  Laterality: N/A;  . Portacath placement  03/11/2015    SOCIAL HISTORY: Social History   Social History  . Marital Status: Married    Spouse Name: N/A  . Number of Children: N/A  . Years of Education: N/A   Occupational History  . Not on file.   Social History Main Topics  . Smoking status: Former Smoker -- 1.00 packs/day for 20  years    Types: Cigarettes    Quit date: 10/03/2012  . Smokeless tobacco: Never Used  . Alcohol Use: No     Comment: Denies current ETOH since 12/2013; Previously 1 pint per day per pt 12/24/2013 x 8-10 years.  . Drug Use: No  . Sexual Activity: No   Other Topics Concern  . 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 Colon cancer Neg Hx    has no family status information on file.   Father deceased at 22 yo of a stroke Mother deceased, not sure of the cause 4 brothers and 5 sisters Only 1 sister still living. 1 brother died of throat cancer  ALLERGIES:  has No Known Allergies.  MEDICATIONS:  Current Outpatient Prescriptions  Medication Sig Dispense Refill  . atorvastatin (LIPITOR) 10 MG tablet Take 10 mg by mouth daily at 6 PM.     . cyanocobalamin (,VITAMIN B-12,) 1000 MCG/ML injection Inject 1,000 mcg into the muscle every 30 (thirty) days.    Marland Kitchen lidocaine-prilocaine (EMLA) cream Apply a quarter size amount to port site 1 hour prior to chemo. Do not rub in. Cover with plastic wrap. 30 g 3  . Nutritional Supplements (FEEDING SUPPLEMENT, OSMOLITE 1.2 CAL,) LIQD 1 can 4x daily (1000, 1400, 1800, 2200) Flush w/ 50 mls before and after each can: 400 extra ccs of fluid.  0  . ondansetron (ZOFRAN ODT) 8 MG disintegrating tablet Take 1 tablet (8 mg total) by mouth every 8 (eight) hours as needed for nausea or vomiting. 30 tablet 2  . oxyCODONE-acetaminophen (PERCOCET/ROXICET) 5-325 MG tablet     . polyethylene glycol (MIRALAX / GLYCOLAX) packet Take 17 g by mouth daily.    Marland Kitchen senna-docusate (SENOKOT-S) 8.6-50 MG per tablet Take 1 tablet by mouth daily.    Marland Kitchen CARBOPLATIN IV Inject into the vein. To be given once a week concurrently with XRT    . ENSURE PLUS (ENSURE PLUS) LIQD Take 237 mLs by mouth 2 (two)  times daily.    . folic acid (FOLVITE) Q000111Q MCG tablet Take 400 mcg by mouth daily.    . hydrALAZINE (APRESOLINE) 10 MG tablet Take 1 tablet (10 mg total) by mouth every 8 (eight) hours. (Patient taking differently: Take 10 mg by mouth 2 (two) times daily. ) 90 tablet 1  . lisinopril (PRINIVIL,ZESTRIL) 10 MG tablet Take 10 mg by mouth daily.    . metoprolol tartrate (LOPRESSOR) 25 MG tablet Take 12.5 mg by mouth 2 (two) times daily.    . Multiple Vitamin (MULTIVITAMIN WITH MINERALS) TABS tablet Take 1 tablet by mouth daily. 30 tablet 1  . PACLitaxel (TAXOL IV) Inject into the vein. To be given once a week concurrently with XRT    . prochlorperazine (COMPAZINE) 10 MG tablet Take 1 tablet (10 mg total) by mouth every 6 (six) hours as  needed for nausea or vomiting. 30 tablet 2  . promethazine (PHENERGAN) 25 MG tablet Take 12.5 mg by mouth every 6 (six) hours as needed for nausea or vomiting.     Marland Kitchen PROMETHAZINE HCL IJ Inject 12.5 mg as directed every 6 (six) hours as needed (Nausea and vomitting).    . Vitamin D, Ergocalciferol, (DRISDOL) 50000 units CAPS capsule Take 50,000 Units by mouth every 30 (thirty) days.     No current facility-administered medications for this visit.   Facility-Administered Medications Ordered in Other Visits  Medication Dose Route Frequency Provider Last Rate Last Dose  . sodium chloride flush (NS) 0.9 % injection 10 mL  10 mL Intracatheter PRN Patrici Ranks, MD   10 mL at 04/10/15 1224    Review of Systems  Constitutional: Denies HENT: Negative.        Trouble swallowing.  Eyes: Negative.   Respiratory: Negative.   Cardiovascular: Negative.   Gastrointestinal: Denies  Genitourinary: Negative.   Musculoskeletal: Negative.   Skin: Negative.   Neurological: Positive for sensory change, speech change, focal weakness and weakness.       Left sided weakness associated with prior stroke.  Endo/Heme/Allergies: Negative.   Psychiatric/Behavioral: Negative.   All  other systems reviewed and are negative.  14 point ROS was done and is otherwise as detailed above or in HPI   PHYSICAL EXAMINATION: ECOG PERFORMANCE STATUS: 2 - Symptomatic, <50% confined to bed   Filed Vitals:   04/10/15 1057  BP: 110/67  Pulse: 72  Temp: 98.6 F (37 C)  Resp: 18   Filed Weights   04/10/15 1057  Weight: 199 lb 1.6 oz (90.311 kg)    Physical Exam  Constitutional: He is oriented to person, place, and time and well-developed, well-nourished, and in no distress.  In wheelchair. Wears glasses. Physical exam performed in wheelchair today. HENT:  Head: Normocephalic and atraumatic.  Mouth/Throat: Oropharynx is clear and moist.  Eyes: Conjunctivae and EOM are normal. Pupils are equal, round, and reactive to light. Right eye exhibits no discharge. Left eye exhibits no discharge.  Right pupil deformity noted.  Neck: Normal range of motion. Neck supple. No JVD present.  Cardiovascular: Normal rate and regular rhythm.   Pulmonary/Chest: Effort normal and breath sounds normal.  Abdominal: Soft. Bowel sounds are normal. He exhibits no distension and no mass. There is no tenderness. There is no rebound and no guarding.  Musculoskeletal: Normal range of motion. He exhibits no edema or tenderness.  Lymphadenopathy:    He has no cervical adenopathy.  Neurological: He is alert and oriented to person, place, and time. No cranial nerve deficit. He exhibits abnormal muscle tone. Coordination abnormal.  Left sided weakness in both upper and lower extremities. Limited grip strength in the left hand.  Skin: Skin is warm and dry.  Psychiatric: Memory normal.  Nursing note and vitals reviewed.   LABORATORY DATA:  I have reviewed the data as listed  Lab Results  Component Value Date   WBC 5.0 04/10/2015   HGB 12.7* 04/10/2015   HCT 37.4* 04/10/2015   MCV 94.7 04/10/2015   PLT 193 04/10/2015   CMP     Component Value Date/Time   NA 135 04/10/2015 1119   K 5.3*  04/10/2015 1119   CL 101 04/10/2015 1119   CO2 25 04/10/2015 1119   GLUCOSE 101* 04/10/2015 1119   BUN 20 04/10/2015 1119   CREATININE 1.34* 04/10/2015 1119   CALCIUM 9.3 04/10/2015 1119   PROT 7.8  04/10/2015 1119   ALBUMIN 4.2 04/10/2015 1119   AST 22 04/10/2015 1119   ALT 20 04/10/2015 1119   ALKPHOS 79 04/10/2015 1119   BILITOT 0.7 04/10/2015 1119   GFRNONAA 51* 04/10/2015 1119   GFRAA 59* 04/10/2015 1119    PATHOLOGY:   RADIOGRAPHIC STUDIES: I have personally reviewed the radiological images as listed and agreed with the findings in the report.   02/25/2015 Study Result     CLINICAL DATA: Initial treatment strategy for squamous cell esophageal carcinoma. Initial diagnosis by endoscopy 02/10/2015  EXAM: NUCLEAR MEDICINE PET SKULL BASE TO THIGH  TECHNIQUE: For 9.9 mCi F-18 FDG was injected intravenously. Full-ring PET imaging was performed from the skull base to thigh after the radiotracer. CT data was obtained and used for attenuation correction and anatomic localization.  FASTING BLOOD GLUCOSE: Value: 99 mg/dl  COMPARISON: MR brain from 09/06/2012  FINDINGS: NECK  No hypermetabolic lymph nodes in the neck. Mild chronic right maxillary sinusitis.  CHEST  Abnormal mid thoracic esophageal region of hypermetabolism extends over a 6 cm vertical excursion, with maximum standard uptake value of 19.1. There is a small metal clip or fiducial along mass hypermetabolic region. No other hypermetabolic activity in the chest.  Linear subsegmental atelectasis or scarring in the posterior basal segment right lower lobe. Pleural calcification along the left hemidiaphragm, significance uncertain.  ABDOMEN/PELVIS  Faintly high activity in both adrenal glands without visible adrenal mass, right adrenal gland maximum SUV 6.8 and left adrenal gland 7.2.  Scattered physiologic activity in the bowel. Prominent prostate gland.  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 I am skeptical of osseous metastatic disease given the overall pattern and the very faint increased metabolic activity.  IMPRESSION: 1. Esophageal mass extends longitudinally over about 6 cm and is associated with very hypermetabolic activity. No definite separable adenopathy in the chest or other metastatic disease identified. 2. 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 3. Faintly increased activity in both adrenal glands without adrenal mass, likely physiologic/incidental. 4. Prominent prostate gland. 5. Mild chronic right maxillary sinusitis.   Electronically Signed  By: Van Clines M.D.  On: 02/25/2015 12:51     CLINICAL DATA: 70 year old male with left-sided weakness. Inability to move left extremities. Stroke.  EXAM: MRI HEAD WITHOUT CONTRAST  MRA HEAD WITHOUT CONTRAST  TECHNIQUE: Multiplanar, multiecho pulse sequences of the brain and surrounding structures were obtained without intravenous contrast. Angiographic images of the head were obtained using MRA technique without contrast.  COMPARISON: Head CT without contrast 09/06/2012.  FINDINGS: MRI HEAD FINDINGS  Confluent restricted diffusion in the posterior right lentiform nuclei probably also affecting the posterior limb of the right internal and external capsules. This infarct does extend toward the right Corona radiata.  In addition, there are punctate areas of restricted diffusion in the left posterior hemisphere along the parieto-occipital sulcus (series 3, image 39) and also in the right occipital lobe.  No  associated mass effect or hemorrhage with these findings. Major intracranial vascular flow voids are preserved.  Underlying numerous chronic lacunar infarcts in the bilateral deep gray matter nuclei, cerebellar hemispheres, and patchy and confluent bilateral cerebral white matter T2 and FLAIR hyperintensity. There is a chronic micro hemorrhage in the left posterior temporal lobe (  series 13, image 13).  Ventricular size and configuration are within normal limits. #2 No midline shift, mass effect, or evidence of intracranial mass lesion. Negative pituitary, cervicomedullary junction visualized cervical spine. Normal bone marrow signal.  Visualized orbit soft tissues are within normal limits. Minor paranasal sinus mucosal thickening. Mastoids are clear. Negative scalp soft tissues.  MRA HEAD FINDINGS  Antegrade flow in the posterior circulation. Codominant distal vertebral arteries. Patent left PICA origin. Patent vertebrobasilar junction. No basilar artery stenosis. SCA and PCA origins are patent. Left posterior communicating artery is present, the right is diminutive or absent. There is mild focal irregularity of the right PCA P1 segment (series 106, image 26). Otherwise the bilateral PCA branches are within normal limits.  Antegrade flow in both ICA siphons. No ICA stenosis. Ophthalmic and left posterior communicating artery origins are within normal limits. Small infundibula at the right ICA terminus suspected.  Carotid termini are patent with normal MCA and ACA origins. Anterior communicating artery and proximal ACA branches are within normal limits. Questionable moderate irregularity and stenosis of the bilateral ACA is at the callosum marginal artery level. Bilateral MCA M1 segments are patent without stenosis. Both MCA bifurcations are patent. Visualized bilateral MCA branches are patent with mild branch irregularity.  IMPRESSION: MRI HEAD IMPRESSION  1. Acute  lacunar infarct of the right lentiform nuclei, and likely affecting the posterior limbs of the right internal and external capsules. No associated mass effect or hemorrhage.  2. Underlying advanced chronic small vessel ischemia, such that punctate acute infarcts also noted in the bilateral PCA territories probably reflect synchronous small vessel disease.  MRA HEAD IMPRESSION  1. Mild irregularity of the right PCA P1 segment which could be related to the acute MRI findings on the basis of right thalamus striate artery origin involvement.  2. Otherwise mild intracranial atherosclerosis. Questionable moderate stenoses of the ACA callosomarginal arteries, versus artifact.   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 Will Proceed forward today with next cycle of carbo/taxol. We are going to refer him to Dr. Verlene Mayer, I have discussed this with the patient's family.   His blood work looks good today.  His potassium is high, medications reviewed. Kayexalate 30 gm po X1.  He will return in one week for ongoing follow-up and therapy.   All questions were answered. The patient knows to call the clinic with any problems, questions or concerns.  This document serves as a record of services personally performed by Ancil Linsey, MD. It was created on her behalf by Toni Amend, a trained medical scribe. The  creation of this record is based on the scribe's personal observations and the provider's statements to them. This document has been checked and approved by the attending provider.  I have reviewed the above documentation for accuracy and completeness, and I agree with the above.  This note was electronically signed.  Molli Hazard, MD  04/10/2015 4:02 PM

## 2015-04-10 NOTE — Patient Instructions (Signed)
[facility] Discharge Instructions for Patients Receiving Chemotherapy   Beginning January 23rd 2017 lab work for the [facility] will be done in the  Main lab at [facility] on 1st floor. If you have a lab appointment with the [facility] please come in thru the  Main Entrance and check in at the main information desk   Today you received the following chemotherapy agents weekly Taxol and carbo.  To help prevent nausea and vomiting after your treatment, we encourage you to take your nausea medication as instructed.   If you develop nausea and vomiting, or diarrhea that is not controlled by your medication, call the clinic.  The clinic phone number is [phone]. Office hours are Monday-Friday 8:30am-5:00pm.  BELOW ARE SYMPTOMS THAT SHOULD BE REPORTED IMMEDIATELY:  *FEVER GREATER THAN 101.0 F  *CHILLS WITH OR WITHOUT FEVER  NAUSEA AND VOMITING THAT IS NOT CONTROLLED WITH YOUR NAUSEA MEDICATION  *UNUSUAL SHORTNESS OF BREATH  *UNUSUAL BRUISING OR BLEEDING  TENDERNESS IN MOUTH AND THROAT WITH OR WITHOUT PRESENCE OF ULCERS  *URINARY PROBLEMS  *BOWEL PROBLEMS  UNUSUAL RASH Items with * indicate a potential emergency and should be followed up as soon as possible. If you have an emergency after office hours please contact your primary care physician or go to the nearest emergency department.  Please call the clinic during office hours if you have any questions or concerns.   You may also contact the Patient Navigator at [phone] should you have any questions or need assistance in obtaining follow up care.  Resources For Cancer Patients and their Caregivers ? American Cancer Society: Can assist with transportation, wigs, general needs, runs Look Good Feel Better.        810-883-5443 ? Cancer Care: Provides financial assistance, online support groups, medication/co-pay assistance.  1-800-813-HOPE (463)230-4588) ? [facility] Assists [place] cancer patients and their families through emotional , educational and financial support.  [phone] ? [place] DSS Where to apply for food stamps, Medicaid and utility assistance. (801)737-9159 ? [employer]: Transportation to medical appointments. 769-359-6613 ? Social Security Administration: May apply for disability if have a Stage IV cancer. [phone] [phone] ? [employer], Disability and Transit Services: Assists with nutrition, care and transit needs. [phone]

## 2015-04-10 NOTE — Patient Instructions (Addendum)
[place] at [facility] Discharge Instructions  RECOMMENDATIONS MADE BY THE CONSULTANT AND ANY TEST RESULTS WILL BE SENT TO YOUR REFERRING PHYSICIAN.   Exam and discussion by Dr Whitney Muse today Labs are stable Potassium high, will need to take kayexalate 60 ml this evening  Return to see the doctor as scheduled Weekly treatments  Please call the clinic if you have any questions or concerns    Thank you for choosing [place] at [facility] to provide your oncology and hematology care.  To afford each patient quality time with our provider, please arrive at least 15 minutes before your scheduled appointment time.   Beginning January 23rd 2017 lab work for the [facility] will be done in the  Main lab at [facility] on 1st floor. If you have a lab appointment with the [facility] please come in thru the  Main Entrance and check in at the main information desk  You need to re-schedule your appointment should you arrive 10 or more minutes late.  We strive to give you quality time with our providers, and arriving late affects you and other patients whose appointments are after yours.  Also, if you no show three or more times for appointments you may be dismissed from the clinic at the providers discretion.     Again, thank you for choosing [facility].  Our hope is that these requests will decrease the amount of time that you wait before being seen by our physicians.       _____________________________________________________________  Should you have questions after your visit to [facility], please contact our office at [phone] between the hours of 8:30 a.m. and 4:30 p.m.  Voicemails left after 4:30 p.m. will not be returned until the following business day.  For prescription refill requests, have your pharmacy contact our office.         Resources For Cancer Patients and their Caregivers ? American  Cancer Society: Can assist with transportation, wigs, general needs, runs Look Good Feel Better.        (930) 176-1582 ? Cancer Care: Provides financial assistance, online support groups, medication/co-pay assistance.  1-800-813-HOPE (215)489-6568) ? [place] Assists [place] cancer patients and their families through emotional , educational and financial support.  931-787-4130 ? [place] DSS Where to apply for food stamps, Medicaid and utility assistance. 786-533-3774 ? [employer]: Transportation to medical appointments. (567)033-6474 ? Social Security Administration: May apply for disability if have a Stage IV cancer. 667-550-1154 412-502-2845 ? [employer], Disability and Transit Services: Assists with nutrition, care and transit needs. [phone]

## 2015-04-17 ENCOUNTER — Encounter (HOSPITAL_COMMUNITY): Payer: Medicare Other | Attending: Hematology & Oncology

## 2015-04-17 ENCOUNTER — Encounter (HOSPITAL_BASED_OUTPATIENT_CLINIC_OR_DEPARTMENT_OTHER): Payer: Medicare Other | Admitting: Hematology & Oncology

## 2015-04-17 ENCOUNTER — Encounter: Payer: Self-pay | Admitting: Dietician

## 2015-04-17 ENCOUNTER — Encounter (HOSPITAL_COMMUNITY): Payer: Self-pay | Admitting: Hematology & Oncology

## 2015-04-17 VITALS — BP 121/71 | HR 75 | Temp 99.1°F | Resp 16

## 2015-04-17 VITALS — BP 117/61 | HR 72 | Temp 98.6°F | Resp 18 | Wt 197.8 lb

## 2015-04-17 DIAGNOSIS — Z5111 Encounter for antineoplastic chemotherapy: Secondary | ICD-10-CM

## 2015-04-17 DIAGNOSIS — Z789 Other specified health status: Secondary | ICD-10-CM

## 2015-04-17 DIAGNOSIS — C159 Malignant neoplasm of esophagus, unspecified: Secondary | ICD-10-CM | POA: Diagnosis present

## 2015-04-17 DIAGNOSIS — R634 Abnormal weight loss: Secondary | ICD-10-CM

## 2015-04-17 DIAGNOSIS — I639 Cerebral infarction, unspecified: Secondary | ICD-10-CM

## 2015-04-17 LAB — COMPREHENSIVE METABOLIC PANEL
ALBUMIN: 3.9 g/dL (ref 3.5–5.0)
ALK PHOS: 74 U/L (ref 38–126)
ALT: 19 U/L (ref 17–63)
ANION GAP: 8 (ref 5–15)
AST: 20 U/L (ref 15–41)
BILIRUBIN TOTAL: 0.6 mg/dL (ref 0.3–1.2)
BUN: 17 mg/dL (ref 6–20)
CALCIUM: 8.9 mg/dL (ref 8.9–10.3)
CO2: 24 mmol/L (ref 22–32)
CREATININE: 1.11 mg/dL (ref 0.61–1.24)
Chloride: 102 mmol/L (ref 101–111)
GFR calc Af Amer: >60 mL/min (ref >60)
GFR calc non Af Amer: >60 mL/min (ref >60)
GLUCOSE: 107 mg/dL — AB (ref 65–99)
Potassium: 4.9 mmol/L (ref 3.5–5.1)
SODIUM: 134 mmol/L — AB (ref 135–145)
TOTAL PROTEIN: 7.4 g/dL (ref 6.5–8.1)

## 2015-04-17 LAB — CBC WITH DIFFERENTIAL/PLATELET
BASOS ABS: 0 10*3/uL (ref 0.0–0.1)
BASOS PCT: 0 %
Eosinophils Absolute: 0 10*3/uL (ref 0.0–0.7)
Eosinophils Relative: 1 %
HEMATOCRIT: 32.9 % — AB (ref 39.0–52.0)
HEMOGLOBIN: 11.2 g/dL — AB (ref 13.0–17.0)
Lymphocytes Relative: 10 %
Lymphs Abs: 0.4 10*3/uL — ABNORMAL LOW (ref 0.7–4.0)
MCH: 32 pg (ref 26.0–34.0)
MCHC: 34 g/dL (ref 30.0–36.0)
MCV: 94 fL (ref 78.0–100.0)
Monocytes Absolute: 0.3 10*3/uL (ref 0.1–1.0)
Monocytes Relative: 8 %
NEUTROS ABS: 3.3 10*3/uL (ref 1.7–7.7)
NEUTROS PCT: 81 %
Platelets: 149 10*3/uL — ABNORMAL LOW (ref 150–400)
RBC: 3.5 MIL/uL — AB (ref 4.22–5.81)
RDW: 13 % (ref 11.5–15.5)
WBC: 4.1 10*3/uL (ref 4.0–10.5)

## 2015-04-17 MED ORDER — SODIUM CHLORIDE 0.9% FLUSH
10.0000 mL | INTRAVENOUS | Status: DC | PRN
  Administered 2015-04-17: 10 mL
  Filled 2015-04-17: qty 10

## 2015-04-17 MED ORDER — HEPARIN SOD (PORK) LOCK FLUSH 100 UNIT/ML IV SOLN
500.0000 [IU] | Freq: Once | INTRAVENOUS | Status: AC | PRN
  Administered 2015-04-17: 500 [IU]

## 2015-04-17 MED ORDER — PALONOSETRON HCL INJECTION 0.25 MG/5ML
0.2500 mg | Freq: Once | INTRAVENOUS | Status: AC
  Administered 2015-04-17: 0.25 mg via INTRAVENOUS
  Filled 2015-04-17: qty 5

## 2015-04-17 MED ORDER — SODIUM CHLORIDE 0.9 % IV SOLN
20.0000 mg | Freq: Once | INTRAVENOUS | Status: AC
  Administered 2015-04-17: 20 mg via INTRAVENOUS
  Filled 2015-04-17: qty 2

## 2015-04-17 MED ORDER — SODIUM CHLORIDE 0.9 % IV SOLN
Freq: Once | INTRAVENOUS | Status: AC
  Administered 2015-04-17: 11:00:00 via INTRAVENOUS

## 2015-04-17 MED ORDER — DIPHENHYDRAMINE HCL 50 MG/ML IJ SOLN
50.0000 mg | Freq: Once | INTRAMUSCULAR | Status: AC
  Administered 2015-04-17: 50 mg via INTRAVENOUS
  Filled 2015-04-17: qty 1

## 2015-04-17 MED ORDER — SODIUM CHLORIDE 0.9 % IV SOLN
208.2000 mg | Freq: Once | INTRAVENOUS | Status: AC
  Administered 2015-04-17: 210 mg via INTRAVENOUS
  Filled 2015-04-17: qty 21

## 2015-04-17 MED ORDER — PACLITAXEL CHEMO INJECTION 300 MG/50ML
45.0000 mg/m2 | Freq: Once | INTRAVENOUS | Status: AC
  Administered 2015-04-17: 96 mg via INTRAVENOUS
  Filled 2015-04-17: qty 16

## 2015-04-17 MED ORDER — FAMOTIDINE IN NACL 20-0.9 MG/50ML-% IV SOLN
20.0000 mg | Freq: Once | INTRAVENOUS | Status: AC
  Administered 2015-04-17: 20 mg via INTRAVENOUS
  Filled 2015-04-17: qty 50

## 2015-04-17 NOTE — Patient Instructions (Signed)
[place] at [facility] Discharge Instructions  RECOMMENDATIONS MADE BY THE CONSULTANT AND ANY TEST RESULTS WILL BE SENT TO YOUR REFERRING PHYSICIAN.  Exam done and seen today by Dr. Whitney Muse When done with treatment, we will get you to surgeon. Will refer you to Dr. Servando Snare in [place]. Return to see the doctor in one week with labs Please call the clinic if you have any questions or concerns   Thank you for choosing [place] at [facility] to provide your oncology and hematology care.  To afford each patient quality time with our provider, please arrive at least 15 minutes before your scheduled appointment time.   Beginning January 23rd 2017 lab work for the [facility] will be done in the  Main lab at [facility] on 1st floor. If you have a lab appointment with the [facility] please come in thru the  Main Entrance and check in at the main information desk  You need to re-schedule your appointment should you arrive 10 or more minutes late.  We strive to give you quality time with our providers, and arriving late affects you and other patients whose appointments are after yours.  Also, if you no show three or more times for appointments you may be dismissed from the clinic at the providers discretion.     Again, thank you for choosing [facility].  Our hope is that these requests will decrease the amount of time that you wait before being seen by our physicians.       _____________________________________________________________  Should you have questions after your visit to [facility], please contact our office at [phone] between the hours of 8:30 a.m. and 4:30 p.m.  Voicemails left after 4:30 p.m. will not be returned until the following business day.  For prescription refill requests, have your pharmacy contact our office.         Resources For Cancer Patients and their Caregivers ? American  Cancer Society: Can assist with transportation, wigs, general needs, runs Look Good Feel Better.        343-431-6385 ? Cancer Care: Provides financial assistance, online support groups, medication/co-pay assistance.  1-800-813-HOPE 860-183-1243) ? [place] Assists [place] cancer patients and their families through emotional , educational and financial support.  (360) 623-1704 ? [place] DSS Where to apply for food stamps, Medicaid and utility assistance. 4796168704 ? [employer]: Transportation to medical appointments. (747) 887-1780 ? Social Security Administration: May apply for disability if have a Stage IV cancer. ([phone] 5057852760 ? [employer], Disability and Transit Services: Assists with nutrition, care and transit needs. [phone]

## 2015-04-17 NOTE — Progress Notes (Signed)
Tolerated chemo well. Stable on discharge home with caregiver via wheelchair. 

## 2015-04-17 NOTE — Progress Notes (Signed)
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 Oneida Alar   02/10/2015 Pathology Results Colon, polyp(s), sigmoid - TRADITIONAL SERRATED ADENOMA (2.3 CM) WITH HIGH GRADE DYSPLASIA. - HIGH GRADE DYSPLASIA INVOLVES APPROXIMATELY 10 TO 15% OF THE POLYP. - CAUTERIZED EDGES ARE FREE OF HIGH GRADE DYSPLASIA BUT ARE INVOLVED WITH ADENOMATOUS CHAN   02/10/2015 Procedure EGD by Dr. Oneida Alar   02/10/2015 Pathology Results Esophagus, biopsy, mass - INVASIVE SQUAMOUS CELL CARCINOMA.   02/25/2015 PET scan Esophageal mass extends longitudinally over about 6 cm and is associated with very hypermetabolic activity. No definite separable adenopathy in the chest or other metastatic disease identified. Coarsened trabeculation with very faintly accentuated...   03/11/2015 Procedure Port placement by IR   03/23/2015 - 04/30/2015 Radiation Therapy Dr. Lisbeth Renshaw, completed on 4/20   03/27/2015 - 05/01/2015 Chemotherapy Carboplaton/Paclitaxel weekly    04/01/2015 Procedure PEG placed by IR    HISTORY OF PRESENTING ILLNESS:  Billy Carson 70 y.o. male is here for follow up of squamous cell carcinoma of the esophagus.  He also has a sigmoid polyp with high grade dysplasia (Dr. Oneida Alar believes she removed it all). 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 He is still able to ambulate with the assistance of a walker or a cane. He lives at [facility] assisted [facility] facility with his wife.   Billy Carson returns to the [facility] alone today. He is in a wheelchair as usual and is receiving treatment today.  His weight is down 2 pounds, but he denies any pain when he swallows now. He says he was given something for his throat that has  helped. He is on TF at [facility]. He notes that he still eats.   He notes that everything else is all right and denies any nausea or vomiting.  He sees Dr. Lisbeth Renshaw in [place] for XRT.   MEDICAL HISTORY:  Past Medical History  Diagnosis Date  . Hypertension   . Hyperlipidemia   . Stroke [facility])     left sided weakness  . 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 Tonsillectomy    . Cataract extraction w/phaco Left 10/14/2013    Procedure: CATARACT EXTRACTION PHACO AND INTRAOCULAR LENS PLACEMENT LEFT EYE CDE=11.04;  Surgeon: Williams Che, MD;  Location: [facility] ORS;  Service: Ophthalmology;  Laterality: Left;  . Cataract extraction w/phaco Right 12/30/2013    Procedure: CATARACT EXTRACTION PHACO AND INTRAOCULAR LENS PLACEMENT; CDE:  110.53;  Surgeon: Williams Che, MD;  Location: [facility] ORS;  Service: Ophthalmology;  Laterality: Right;  . Colonoscopy with propofol N/A 02/10/2015    Procedure: COLONOSCOPY WITH PROPOFOL;  Surgeon: Danie Binder, MD;  Location: [facility] SU[facility];  Service: Endoscopy;  Laterality: N/A;  1030  . Esophagogastroduodenoscopy (egd) with propofol N/A 02/10/2015    Procedure: ESOPHAGOGASTRODUODENOSCOPY (EGD) WITH PROPOFOL;  Surgeon: Danie Binder, MD;  Location: [facility];  Service: Endoscopy;  Laterality: N/A;  . Polypectomy N/A 02/10/2015    Procedure: POLYPECTOMY;  Surgeon: Danie Binder, MD;  Location: [facility];  Service: Endoscopy;  Laterality: N/A;  cecum x 1 and ascending colon polyp x1, Medium sigmoid  colon polyp, Medium and Large sigmoid colon polyps  . Biopsy N/A 02/10/2015    Procedure: BIOPSY;  Surgeon: Danie Binder, MD;  Location: [facility];  Service: Endoscopy;  Laterality: N/A;  Esophageal mass biopsies, Gastric biopsies  . Eus N/A 03/05/2015    Procedure: UPPER ENDOSCOPIC ULTRASOUND (EUS) RADIAL;  Surgeon: Milus Banister, MD;  Location: [facility];  Service:  Endoscopy;  Laterality: N/A;  . Portacath placement  03/11/2015    SOCIAL HISTORY: Social History   Social History  . Marital Status: Married    Spouse Name: N/A  . Number of Children: N/A  . Years of Education: N/A   Occupational History  . Not on file.   Social History Main Topics  . Smoking status: Former Smoker -- 1.00 packs/day for 20 years    Types: Cigarettes    Quit date: 10/03/2012  . Smokeless tobacco: Never Used  . Alcohol Use: No     Comment: Denies current ETOH since 12/2013; Previously 1 pint per day per pt 12/24/2013 x 8-10 years.  . Drug Use: No  . Sexual Activity: No   Other Topics Concern  . 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 Colon cancer Neg Hx    has no family status information on file.   Father deceased at 54 yo of a stroke Mother deceased, not sure of the cause 4 brothers and 5 sisters Only 1 sister still living. 1 brother died of throat cancer  ALLERGIES:  has No Known Allergies.  MEDICATIONS:  Current Outpatient Prescriptions  Medication Sig Dispense Refill  . atorvastatin (LIPITOR) 10 MG tablet Take 10 mg by mouth daily at 6 PM.     . CARBOPLATIN IV Inject into the vein. To be given once a week concurrently with XRT    . cyanocobalamin (,VITAMIN B-12,) 1000 MCG/ML injection Inject 1,000 mcg into the muscle every 30 (thirty) days.    . ENSURE PLUS (ENSURE PLUS) LIQD Take 237 mLs by mouth 2 (two) times daily.    . folic acid (FOLVITE) [id number] MCG tablet Take 400 mcg by mouth daily.    . hydrALAZINE (APRESOLINE) 10 MG tablet Take 1 tablet (10 mg total) by mouth every 8 (eight) hours. (Patient taking differently: Take 10 mg by mouth 2 (two) times daily. ) 90 tablet 1  . lidocaine-prilocaine (EMLA) cream Apply a quarter size amount to port site 1 hour prior to  chemo. Do not rub in. Cover with plastic wrap. 30 g 3  . lisinopril (PRINIVIL,ZESTRIL) 10 MG tablet Take 10 mg by mouth daily.    . metoprolol tartrate (LOPRESSOR) 25 MG tablet Take 12.5 mg by mouth 2 (two) times daily.    . Multiple Vitamin (MULTIVITAMIN WITH MINERALS) TABS tablet Take 1 tablet by mouth daily. 30 tablet 1  . Nutritional Supplements (FEEDING SUPPLEMENT, OSMOLITE 1.2 CAL,) LIQD 1 can 4x daily (1000, 1400, 1800, 2200) Flush w/ 50 mls before and after each can: 400 extra ccs of fluid.  0  . nystatin (MYCOSTATIN) 100000 UNIT/ML suspension     . PACLitaxel (TAXOL IV) Inject into the vein. To be given once a week concurrently with XRT    . polyethylene glycol (MIRALAX / GLYCOLAX) packet Take 17 g by mouth daily.    . prochlorperazine (COMPAZINE) 10 MG  tablet Take 1 tablet (10 mg total) by mouth every 6 (six) hours as needed for nausea or vomiting. 30 tablet 2  . promethazine (PHENERGAN) 25 MG tablet Take 12.5 mg by mouth every 6 (six) hours as needed for nausea or vomiting.     Marland Kitchen PROMETHAZINE HCL IJ Inject 12.5 mg as directed every 6 (six) hours as needed (Nausea and vomitting).    Marland Kitchen senna-docusate (SENOKOT-S) 8.6-50 MG per tablet Take 1 tablet by mouth daily.    . Vitamin D, Ergocalciferol, (DRISDOL) 50000 units CAPS capsule Take 50,000 Units by mouth every 30 (thirty) days.    . ondansetron (ZOFRAN ODT) 8 MG disintegrating tablet Take 1 tablet (8 mg total) by mouth every 8 (eight) hours as needed for nausea or vomiting. (Patient not taking: Reported on 04/17/2015) 30 tablet 2  . oxyCODONE-acetaminophen (PERCOCET/ROXICET) 5-325 MG tablet Reported on 04/17/2015     No current facility-administered medications for this visit.   Facility-Administered Medications Ordered in Other Visits  Medication Dose Route Frequency Provider Last Rate Last Dose  . 0.9 %  sodium chloride infusion   Intravenous Once Patrici Ranks, MD      . dexamethasone (DECADRON) 20 mg in sodium chloride 0.9 % 50 mL  IVPB  20 mg Intravenous Once Patrici Ranks, MD      . diphenhydrAMINE (BENADRYL) injection 50 mg  50 mg Intravenous Once Patrici Ranks, MD      . famotidine (PEPCID) IVPB 20 mg premix  20 mg Intravenous Once Patrici Ranks, MD      . heparin lock flush 100 unit/mL  500 Units Intracatheter Once PRN Patrici Ranks, MD      . palonosetron (ALOXI) injection 0.25 mg  0.25 mg Intravenous Once Patrici Ranks, MD      . sodium chloride flush (NS) 0.9 % injection 10 mL  10 mL Intracatheter PRN Patrici Ranks, MD        Review of Systems  Constitutional: Positive for weight loss.  HENT: Negative.        Trouble swallowing.  Eyes: Negative.   Respiratory: Negative.   Cardiovascular: Negative.   Gastrointestinal: Negative Genitourinary: Negative.   Musculoskeletal: Negative.   Skin: Negative.   Neurological: Positive for sensory change, speech change, focal weakness and weakness.       Left sided weakness associated with prior stroke.  Endo/Heme/Allergies: Negative.   Psychiatric/Behavioral: Negative.   All other systems reviewed and are negative.  14 point ROS was done and is otherwise as detailed above or in HPI   PHYSICAL EXAMINATION: ECOG PERFORMANCE STATUS: 2 - Symptomatic, <50% confined to bed   Filed Vitals:   04/17/15 0936  BP: 117/61  Pulse: 72  Temp: 98.6 F (37 C)  Resp: 18   Filed Weights   04/17/15 0936  Weight: 197 lb 12.8 oz (89.721 kg)    Physical Exam  Constitutional: He is oriented to person, place, and time and well-developed, well-nourished, and in no distress.  In wheelchair. Wears glasses. Able to get on exam table with assistance.  HENT:  Head: Normocephalic and atraumatic.  Mouth/Throat: Oropharynx is clear and moist.  Eyes: Conjunctivae and EOM are normal. Pupils are equal, round, and reactive to light. Right eye exhibits no discharge. Left eye exhibits no discharge.  Right pupil deformity noted.  Neck: Normal range of motion.  Neck supple. No JVD present.  Cardiovascular: Normal rate and regular rhythm.   Pulmonary/Chest: Effort normal and breath sounds normal.  Abdominal: Soft. Bowel sounds are normal. He exhibits no distension and no mass. There is no tenderness. There is no rebound and no guarding. FT C/D/I Musculoskeletal: Normal range of motion. He exhibits no edema or tenderness.  Lymphadenopathy:    He has no cervical adenopathy.  Neurological: He is alert and oriented to person, place, and time. No cranial nerve deficit. He exhibits abnormal muscle tone. Coordination abnormal.  Left sided weakness in both upper and lower extremities. Limited grip strength in the left hand.  Skin: Skin is warm and dry.  Psychiatric: Memory normal.  Nursing note and vitals reviewed.   LABORATORY DATA:  I have reviewed the data as listed Lab Results  Component Value Date   WBC 4.1 04/17/2015   HGB 11.2* 04/17/2015   HCT 32.9* 04/17/2015   MCV 94.0 04/17/2015   PLT 149* 04/17/2015   CMP     Component Value Date/Time   NA 134* 04/17/2015 0930   K 4.9 04/17/2015 0930   CL 102 04/17/2015 0930   CO2 24 04/17/2015 0930   GLUCOSE 107* 04/17/2015 0930   BUN 17 04/17/2015 0930   CREATININE 1.11 04/17/2015 0930   CALCIUM 8.9 04/17/2015 0930   PROT 7.4 04/17/2015 0930   ALBUMIN 3.9 04/17/2015 0930   AST 20 04/17/2015 0930   ALT 19 04/17/2015 0930   ALKPHOS 74 04/17/2015 0930   BILITOT 0.6 04/17/2015 0930   GFRNONAA >60 04/17/2015 0930   GFRAA >60 04/17/2015 0930    PATHOLOGY:   RADIOGRAPHIC STUDIES: I have personally reviewed the radiological images as listed and agreed with the findings in the report.   02/25/2015 Study Result     CLINICAL DATA: Initial treatment strategy for squamous cell esophageal carcinoma. Initial diagnosis by endoscopy 02/10/2015  EXAM: NUCLEAR MEDICINE PET SKULL BASE TO THIGH  TECHNIQUE: For 9.9 mCi F-18 FDG was injected intravenously. Full-ring PET imaging was performed  from the skull base to thigh after the radiotracer. CT data was obtained and used for attenuation correction and anatomic localization.  FASTING BLOOD GLUCOSE: Value: 99 mg/dl  COMPARISON: MR brain from 09/06/2012  FINDINGS: NECK  No hypermetabolic lymph nodes in the neck. Mild chronic right maxillary sinusitis.  CHEST  Abnormal mid thoracic esophageal region of hypermetabolism extends over a 6 cm vertical excursion, with maximum standard uptake value of 19.1. There is a small metal clip or fiducial along mass hypermetabolic region. No other hypermetabolic activity in the chest.  Linear subsegmental atelectasis or scarring in the posterior basal segment right lower lobe. Pleural calcification along the left hemidiaphragm, significance uncertain.  ABDOMEN/PELVIS  Faintly high activity in both adrenal glands without visible adrenal mass, right adrenal gland maximum SUV 6.8 and left adrenal gland 7.2.  Scattered physiologic activity in the bowel. Prominent prostate gland.  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 I am skeptical of osseous metastatic disease given the overall pattern and the very faint increased metabolic activity.  IMPRESSION: 1. Esophageal mass extends longitudinally over about 6 cm and is associated with very hypermetabolic activity. No definite separable adenopathy in the chest or other metastatic disease identified. 2. 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 3. Faintly increased activity in both adrenal glands without adrenal mass, likely physiologic/incidental. 4. Prominent  prostate  gland. 5. Mild chronic right maxillary sinusitis.   Electronically Signed  By: Van Clines M.D.  On: 02/25/2015 12:51     CLINICAL DATA: 70 year old male with left-sided weakness. Inability to move left extremities. Stroke.  EXAM: MRI HEAD WITHOUT CONTRAST  MRA HEAD WITHOUT CONTRAST  TECHNIQUE: Multiplanar, multiecho pulse sequences of the brain and surrounding structures were obtained without intravenous contrast. Angiographic images of the head were obtained using MRA technique without contrast.  COMPARISON: Head CT without contrast 09/06/2012.  FINDINGS: MRI HEAD FINDINGS  Confluent restricted diffusion in the posterior right lentiform nuclei probably also affecting the posterior limb of the right internal and external capsules. This infarct does extend toward the right Corona radiata.  In addition, there are punctate areas of restricted diffusion in the left posterior hemisphere along the parieto-occipital sulcus (series 3, image 39) and also in the right occipital lobe.  No associated mass effect or hemorrhage with these findings. Major intracranial vascular flow voids are preserved.  Underlying numerous chronic lacunar infarcts in the bilateral deep gray matter nuclei, cerebellar hemispheres, and patchy and confluent bilateral cerebral white matter T2 and FLAIR hyperintensity. There is a chronic micro hemorrhage in the left posterior temporal lobe (series 13, image 13).  Ventricular size and configuration are within normal limits. #2 No midline shift, mass effect, or evidence of intracranial mass lesion. Negative pituitary, cervicomedullary junction visualized cervical spine. Normal bone marrow signal.  Visualized orbit soft tissues are within normal limits. Minor paranasal sinus mucosal thickening. Mastoids are clear. Negative scalp soft tissues.  MRA HEAD FINDINGS  Antegrade flow in the posterior circulation. Codominant  distal vertebral arteries. Patent left PICA origin. Patent vertebrobasilar junction. No basilar artery stenosis. SCA and PCA origins are patent. Left posterior communicating artery is present, the right is diminutive or absent. There is mild focal irregularity of the right PCA P1 segment (series 106, image 26). Otherwise the bilateral PCA branches are within normal limits.  Antegrade flow in both ICA siphons. No ICA stenosis. Ophthalmic and left posterior communicating artery origins are within normal limits. Small infundibula at the right ICA terminus suspected.  Carotid termini are patent with normal MCA and ACA origins. Anterior communicating artery and proximal ACA branches are within normal limits. Questionable moderate irregularity and stenosis of the bilateral ACA is at the callosum marginal artery level. Bilateral MCA M1 segments are patent without stenosis. Both MCA bifurcations are patent. Visualized bilateral MCA branches are patent with mild branch irregularity.  IMPRESSION: MRI HEAD IMPRESSION  1. Acute lacunar infarct of the right lentiform nuclei, and likely affecting the posterior limbs of the right internal and external capsules. No associated mass effect or hemorrhage.  2. Underlying advanced chronic small vessel ischemia, such that punctate acute infarcts also noted in the bilateral PCA territories probably reflect synchronous small vessel disease.  MRA HEAD IMPRESSION  1. Mild irregularity of the right PCA P1 segment which could be related to the acute MRI findings on the basis of right thalamus striate artery origin involvement.  2. Otherwise mild intracranial atherosclerosis. Questionable moderate stenoses of the ACA callosomarginal arteries, versus artifact.   Electronically Signed  By: Lars Pinks  On: 09/06/2012 18:19    ASSESSMENT & PLAN:  EGD 02/10/2015 with esophageal mass, biopsy c/w squamous cell carcinoma Esophageal mass  extends from 25cm to 31 cm from the teeth, narrowing lumen to 10-11 cm in mid esophagus Two clips placed to control bleeding Colonoscopy on 02/10/2015 with 5 colon polyps, large internal hemorrhoids Pathology  with sigmoid polyp, serrated adenoma 2.3 cm with high grade dysplasia History of stroke with left-sided weakness Stage III chronic kidney disease NH resident at [facility] Concurrent chemo/xrt Tube Feeding secondary to progressive weight loss  He is doing suprisingly well with therapy. He never complains, but weight is fairly stable, labs and vitals are good.  Will proceed with therapy again today.  . Reminder to call Dr. Lisbeth Renshaw in [place] regarding surgery referral.   We will also refer him down to Dr. Servando Snare in [place].  He will come back in 1 week with lab and treatment and visit.  I will have Ovid Curd revisit his nutrition plan at [facility] as weight is down slightly today.   All questions were answered. The patient knows to call the clinic with any problems, questions or concerns.  This document serves as a record of services personally performed by Ancil Linsey, MD. It was created on her behalf by Toni Amend, a trained medical scribe. The creation of this record is based on the scribe's personal observations and the provider's statements to them. This document has been checked and approved by the attending provider.  I have reviewed the above documentation for accuracy and completeness, and I agree with the above.  This note was electronically signed.    Molli Hazard, MD  04/17/2015 10:44 AM

## 2015-04-17 NOTE — Progress Notes (Signed)
Following up with patient. He had been started on TF approximately 2 weeks ago.   Wt Readings from Last 10 Encounters:  04/17/15 197 lb 12.8 oz (89.721 kg)  04/10/15 199 lb 1.6 oz (90.311 kg)  04/03/15 199 lb 3.2 oz (90.357 kg)  04/03/15 201 lb (91.173 kg)  03/27/15 203 lb 9.6 oz (92.352 kg)  03/11/15 205 lb (92.987 kg)  03/06/15 205 lb 3.2 oz (93.078 kg)  03/05/15 209 lb (94.802 kg)  02/26/15 209 lb 4.8 oz (94.938 kg)  02/16/15 208 lb (94.348 kg)   Patient weight has Decreased by 6 lbs since he started TF.   Pt is a poor historian and is was difficult to discern exactly how much TF he is receiving and how much PO intake he has.   Pt initially stated that he is no longer eating, only drinking "water and milk". He said that the staff at the snf gives him Tube feeding 2-3 times a day. RD asked if this feed consisted of 1 can or 2 cans. He says they will will give one can and then give "another one a little later". Could not discern if was describing separate "feeds" or the same one.   RD explained that he needs to increase his TF as he now has become mostly reliant on it for nutrition. To meet his needs it is estimated he requires 8 cans each day.   He denied n/v/c/d. He does not have any mouth soreness. RD asked if it hurts when he drinks fluids. He say fluids dont seem to bother him, but food can. RD then asked him to clarify if he was eating. He then stated that he was, in fact, still eating solid foods.   Patient reports oral intake as present, but decreased and is currently not suffering from  Any notable side effects.  Discussed with physician. MD to write appointment order for pt's TF regimen to be increased to 6 cans when he returns to SNF.   Pt seemed to be in no distress. Again, he is very placid today.   RD to continue to follow and monitor weight  Burtis Junes RD, LDN Nutrition Pager: [phone] 04/17/2015 3:18 PM

## 2015-04-17 NOTE — Patient Instructions (Signed)
[facility] Discharge Instructions for Patients Receiving Chemotherapy   Beginning January 23rd 2017 lab work for the [facility] will be done in the  Main lab at [facility] on 1st floor. If you have a lab appointment with the [facility] please come in thru the  Main Entrance and check in at the main information desk   Today you received the following chemotherapy agents Taxol and Carbo.  To help prevent nausea and vomiting after your treatment, we encourage you to take your nausea medication as instructed.   If you develop nausea and vomiting, or diarrhea that is not controlled by your medication, call the clinic.  The clinic phone number is [phone]. Office hours are Monday-Friday 8:30am-5:00pm.  BELOW ARE SYMPTOMS THAT SHOULD BE REPORTED IMMEDIATELY:  *FEVER GREATER THAN 101.0 F  *CHILLS WITH OR WITHOUT FEVER  NAUSEA AND VOMITING THAT IS NOT CONTROLLED WITH YOUR NAUSEA MEDICATION  *UNUSUAL SHORTNESS OF BREATH  *UNUSUAL BRUISING OR BLEEDING  TENDERNESS IN MOUTH AND THROAT WITH OR WITHOUT PRESENCE OF ULCERS  *URINARY PROBLEMS  *BOWEL PROBLEMS  UNUSUAL RASH Items with * indicate a potential emergency and should be followed up as soon as possible. If you have an emergency after office hours please contact your primary care physician or go to the nearest emergency department.  Please call the clinic during office hours if you have any questions or concerns.   You may also contact the Patient Navigator at [phone] should you have any questions or need assistance in obtaining follow up care.  Resources For Cancer Patients and their Caregivers ? American Cancer Society: Can assist with transportation, wigs, general needs, runs Look Good Feel Better.        773-093-9663 ? Cancer Care: Provides financial assistance, online support groups, medication/co-pay assistance.  1-800-813-HOPE 509-357-1214) ? [facility] Assists  [place] cancer patients and their families through emotional , educational and financial support.  (236) 539-8290 ? [place] DSS Where to apply for food stamps, Medicaid and utility assistance. (412) 231-9900 ? [facility]: Transportation to medical appointments. 940-882-3541 ? Social Security Administration: May apply for disability if have a Stage IV cancer. ([phone] ? [employer], Disability and Transit Services: Assists with nutrition, care and transit needs. [phone]

## 2015-04-21 ENCOUNTER — Telehealth: Payer: Self-pay | Admitting: *Deleted

## 2015-04-21 NOTE — Telephone Encounter (Signed)
CALLED 3 TIMES TO GIVE APPOINTMENT INFO TO DAUGHTER THAT WAS LISTED IN DEMOGRAPHICS, FINALLY REACHED DAUGHTER AND SHE ASKED ME TO CALL [facility], PATIENT IS A RESIDENT THERE. TALKED WITH EVELYN AT [facility]//CM

## 2015-04-22 ENCOUNTER — Institutional Professional Consult (permissible substitution) (INDEPENDENT_AMBULATORY_CARE_PROVIDER_SITE_OTHER): Payer: Medicare Other | Admitting: Cardiothoracic Surgery

## 2015-04-22 VITALS — BP 119/73 | HR 81 | Resp 16 | Ht 72.0 in | Wt 200.0 lb

## 2015-04-22 DIAGNOSIS — C153 Malignant neoplasm of upper third of esophagus: Secondary | ICD-10-CM

## 2015-04-22 NOTE — Assessment & Plan Note (Addendum)
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  Oncology history updated.  Weight is down another 2 pounds. Billy Carson has seen the patient today in follow-up. He recommends 6 cans of nutrition per day. The patient is currently receiving 4 cans per day. Our nursing staff has called the patient's nursing residence and nursing there reports that due to radiation appointments, they have been unable to provide 6 cans of nutrition per day given the long time that the patient is out of the facility and there avoidance of hindering the patient's desire to eat meals.  I wonder if he is getting 6 cans of nutrition per day on weekends when he does not have any appointments?  The nursing home in coordination with their own nutrition is we'll work on increasing the patient's feedings.  Pre-treatment labs today: CBC diff, CMET.  Labs meet treatment parameters today.  Leukopenia is noted.  ANC is appropriate.  Anemia is stable.  Treatment plan dates are manipulated to accommodate for the Easter holiday season.  Hyperkalemia noted.  Order is placed for 30 grams of Kayexalate at nursing facility today.  Order placed to increase feeding cans to 6 per day (compared to 4 per day) due to continued weight loss.  He has seen Dr. Servando Snare ([facility]) on 04/22/2015 and his note is not yet completed for review. I'll be on the lookout for this.  Return next week for follow-up and cycle #6.  This may be his last cycle based upon what he tells me his completion date for radiation is ("next Thursday").  We will need to confirm this with XRT. I was unable to get in touch with radiation oncology prior to closing time and therefore I sent a message to nursing requesting a call to radiation oncology to confirm completion date of radiation.

## 2015-04-22 NOTE — Progress Notes (Signed)
Jani Gravel, MD [address] [place] [address]  Squamous cell carcinoma of esophagus [facility])  CURRENT THERAPY: Beginning concomitant chemoradiation with Carboplatin/Paclitaxel on 03/27/2015  INTERVAL HISTORY: Billy Carson 70 y.o. 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    Squamous cell carcinoma of esophagus ([place])   02/10/2015 Procedure Colonoscopy by Dr. Oneida Alar   02/10/2015 Pathology Results Colon, polyp(s), sigmoid - TRADITIONAL SERRATED ADENOMA (2.3 CM) WITH HIGH GRADE DYSPLASIA. - HIGH GRADE DYSPLASIA INVOLVES APPROXIMATELY 10 TO 15% OF THE POLYP. - CAUTERIZED EDGES ARE FREE OF HIGH GRADE DYSPLASIA BUT ARE INVOLVED WITH ADENOMATOUS CHAN   02/10/2015 Procedure EGD by Dr. Oneida Alar   02/10/2015 Pathology Results Esophagus, biopsy, mass - INVASIVE SQUAMOUS CELL CARCINOMA.   02/25/2015 PET scan Esophageal mass extends longitudinally over about 6 cm and is associated with very hypermetabolic activity. No definite separable adenopathy in the chest or other metastatic disease identified. Coarsened trabeculation with very faintly accentuated...   03/11/2015 Procedure Port placement by IR   03/23/2015 -  Radiation Therapy Dr. Lisbeth Renshaw   03/27/2015 -  Chemotherapy Carboplaton/Paclitaxel weekly    04/01/2015 Procedure PEG placed by IR    I personally reviewed and went over laboratory results with the patient.  The results are noted within this dictation.  Labs today demonstrate a minimal leukopenia with a normal ANC of 2.9. Platelet count is down to 128,000. Anemia is stable at 11.5 g/dL. He meets treatment parameters today. Metabolic panel demonstrates a minimal hyperkalemia of 5.3. Renal function is stable.  Chart reviewed. He has seen Dr. Servando Snare on 04/22/2015 for cardiothoracic surgery  consultation.  I do not believe the note is yet completed and I will be on the look out for this.  Patient reports his appetite is strong. He denies any noticeable weight loss. Today his weight is down another 2 pounds according to our scales. Of note, he has a weight of 200 pounds on 04/22/2015 but that must be on a different scale, probably away in at Dr. Everrett Coombe office.  He reports that he eats 3 meals per day in addition to 4 cans of nutrition through his feeding tube. He is prescribed 6 cans per day and I will have nursing call [facility] to check on this. Due to his continued weight loss I had sent Burtis Junes, RD, an updated message.  Ovid Curd saw the patient today and reports that he needs 6 cans per day.  Nursing called his facility and they confirm that they are trying to feed him 6 cans per day but this has been complicated by long appointments at radiation oncology hindering their ability to provide except hands in a 24-hour period without interfering with his desire to eat foods at mealtimes. I wonder if he is getting 6 cans per day on weekends when he does not have radiation therapy?  Dywane denies any nausea, vomiting, diarrhea, constipation, abdominal pain, shortness of breath, cough, sputum production, difficulty swallowing, etc.  Past Medical History  Diagnosis Date  . Hypertension   . Hyperlipidemia   . Stroke [facility])     left sided weakness  . Squamous cell carcinoma of esophagus (HCC) 02/26/2015    has CVA (cerebral infarction); Tobacco abuse; Alcohol abuse; Hypertension; Encounter for screening colonoscopy; and Squamous cell carcinoma of esophagus ([place]) on his problem list.  has No Known Allergies.  Current Outpatient Prescriptions on File Prior to Visit  Medication Sig Dispense Refill  . acetaminophen (TYLENOL) 500 MG tablet Take 500 mg by mouth every 8 (eight) hours as needed (VIA G-TUBE FOR AS NEEDED FOR PAIN).    Marland Kitchen atorvastatin (LIPITOR) 10 MG tablet Take 10 mg by  mouth daily at 6 PM.     . CARBOPLATIN IV Inject into the vein. To be given once a week concurrently with XRT    . cyanocobalamin (,VITAMIN B-12,) 1000 MCG/ML injection Inject 1,000 mcg into the muscle every 30 (thirty) days.    . ENSURE PLUS (ENSURE PLUS) LIQD Take 237 mLs by mouth 2 (two) times daily.    . folic acid (FOLVITE) [id number] MCG tablet Take 400 mcg by mouth daily.    . hydrALAZINE (APRESOLINE) 10 MG tablet Take 1 tablet (10 mg total) by mouth every 8 (eight) hours. (Patient taking differently: Take 10 mg by mouth 2 (two) times daily. ) 90 tablet 1  . lidocaine-prilocaine (EMLA) cream Apply a quarter size amount to port site 1 hour prior to chemo. Do not rub in. Cover with plastic wrap. 30 g 3  . lisinopril (PRINIVIL,ZESTRIL) 10 MG tablet Take 10 mg by mouth daily.    . metoprolol tartrate (LOPRESSOR) 25 MG tablet Take 12.5 mg by mouth 2 (two) times daily.    . Multiple Vitamin (MULTIVITAMIN WITH MINERALS) TABS tablet Take 1 tablet by mouth daily. 30 tablet 1  . Nutritional Supplements (FEEDING SUPPLEMENT, OSMOLITE 1.2 CAL,) LIQD 1 can 4x daily (1000, 1400, 1800, 2200) Flush w/ 50 mls before and after each can: 400 extra ccs of fluid.  0  . nystatin (MYCOSTATIN) 100000 UNIT/ML suspension 5 mLs every 4 (four) hours as needed.     . ondansetron (ZOFRAN ODT) 8 MG disintegrating tablet Take 1 tablet (8 mg total) by mouth every 8 (eight) hours as needed for nausea or vomiting. (Patient not taking: Reported on 04/17/2015) 30 tablet 2  . oxyCODONE-acetaminophen (PERCOCET/ROXICET) 5-325 MG tablet Reported on 04/22/2015    . PACLitaxel (TAXOL IV) Inject into the vein. Reported on 04/22/2015    . polyethylene glycol (MIRALAX / GLYCOLAX) packet Take 17 g by mouth daily.    . prochlorperazine (COMPAZINE) 10 MG tablet Take 1 tablet (10 mg total) by mouth every 6 (six) hours as needed for nausea or vomiting. 30 tablet 2  . promethazine (PHENERGAN) 25 MG tablet Take 12.5 mg by mouth every 6 (six) hours as  needed for nausea or vomiting.     Marland Kitchen PROMETHAZINE HCL IJ Inject 12.5 mg as directed every 6 (six) hours as needed (Nausea and vomitting).    Marland Kitchen senna-docusate (SENOKOT-S) 8.6-50 MG per tablet Take 1 tablet by mouth daily.    . Vitamin D, Ergocalciferol, (DRISDOL) 50000 units CAPS capsule Take 50,000 Units by mouth every 30 (thirty) days.     Current Facility-Administered Medications on File Prior to Visit  Medication Dose Route Frequency Provider Last Rate Last Dose  . sodium chloride flush (NS) 0.9 % injection 10 mL  10 mL Intracatheter PRN Patrici Ranks, MD   10 mL at 04/23/15 [id number]    Past Surgical History  Procedure Laterality Date  . Tonsillectomy    . Cataract extraction w/phaco Left 10/14/2013    Procedure: CATARACT EXTRACTION PHACO AND INTRAOCULAR LENS PLACEMENT LEFT EYE CDE=11.04;  Surgeon: Williams Che, MD;  Location: [facility];  Service: Ophthalmology;  Laterality: Left;  . Cataract extraction w/phaco  Right 12/30/2013    Procedure: CATARACT EXTRACTION PHACO AND INTRAOCULAR LENS PLACEMENT; CDE:  110.53;  Surgeon: Williams Che, MD;  Location: [facility];  Service: Ophthalmology;  Laterality: Right;  . Colonoscopy with propofol N/A 02/10/2015    Procedure: COLONOSCOPY WITH PROPOFOL;  Surgeon: Danie Binder, MD;  Location: [facility];  Service: Endoscopy;  Laterality: N/A;  1030  . Esophagogastroduodenoscopy (egd) with propofol N/A 02/10/2015    Procedure: ESOPHAGOGASTRODUODENOSCOPY (EGD) WITH PROPOFOL;  Surgeon: Danie Binder, MD;  Location: [facility];  Service: Endoscopy;  Laterality: N/A;  . Polypectomy N/A 02/10/2015    Procedure: POLYPECTOMY;  Surgeon: Danie Binder, MD;  Location: [facility];  Service: Endoscopy;  Laterality: N/A;  cecum x 1 and ascending colon polyp x1, Medium sigmoid colon polyp, Medium and Large sigmoid colon polyps  . Biopsy N/A 02/10/2015    Procedure: BIOPSY;  Surgeon: Danie Binder, MD;  Location: [facility];  Service: Endoscopy;   Laterality: N/A;  Esophageal mass biopsies, Gastric biopsies  . Eus N/A 03/05/2015    Procedure: UPPER ENDOSCOPIC ULTRASOUND (EUS) RADIAL;  Surgeon: Milus Banister, MD;  Location: [facility];  Service: Endoscopy;  Laterality: N/A;  . Portacath placement  03/11/2015    Denies any headaches, dizziness, double vision, fevers, chills, night sweats, nausea, vomiting, diarrhea, constipation, chest pain, heart palpitations, shortness of breath, blood in stool, black tarry stool, urinary pain, urinary burning, urinary frequency, hematuria.   PHYSICAL EXAMINATION  ECOG PERFORMANCE STATUS: 2 - Symptomatic, <50% confined to bed  Filed Vitals:   04/23/15 0932  BP: 118/76  Temp: 98.9 F (37.2 C)  Resp: 18     GENERAL:alert, well nourished, well developed, comfortable, cooperative, smiling and in a chemotherapy recliner, port accessed.  Unaccompanied. SKIN: skin color, texture, turgor are normal, no rashes or significant lesions HEAD: Normocephalic, No masses, lesions, tenderness or abnormalities EYES: normal, EOMI, Conjunctiva are pink and non-injected EARS: External ears normal OROPHARYNX:mucous membranes are moist  NECK: trachea midline, supple LYMPH:  not examined BREAST:not examined LUNGS: Clear to auscultation bilaterally without wheezes, rales, or rhonchi. HEART: Regular rate and rhythm without murmur, rub, or gallop. Normal S1 and S2. ABDOMEN: Positive bowel sounds in all 4 quadrants, nontender, soft.  No organomegaly noted. BACK: Back symmetric, no curvature. EXTREMITIES:less then 2 second capillary refill, no skin discoloration, no cyanosis  NEURO: alert & oriented x 3 with fluent speech   LABORATORY DATA: CBC    Component Value Date/Time   WBC 3.5* 04/23/2015 0925   RBC 3.60* 04/23/2015 0925   HGB 11.5* 04/23/2015 0925   HCT 33.7* 04/23/2015 0925   PLT 128* 04/23/2015 0925   MCV 93.6 04/23/2015 0925   MCH 31.9 04/23/2015 0925   MCHC 34.1 04/23/2015 0925   RDW 12.8  04/23/2015 0925   LYMPHSABS 0.4* 04/23/2015 0925   MONOABS 0.2 04/23/2015 0925   EOSABS 0.0 04/23/2015 0925   BASOSABS 0.0 04/23/2015 0925      Chemistry      Component Value Date/Time   NA 135 04/23/2015 0925   K 5.3* 04/23/2015 0925   CL 102 04/23/2015 0925   CO2 25 04/23/2015 0925   BUN 24* 04/23/2015 0925   CREATININE 1.12 04/23/2015 0925      Component Value Date/Time   CALCIUM 9.2 04/23/2015 0925   ALKPHOS 72 04/23/2015 0925   AST 19 04/23/2015 0925   ALT 20 04/23/2015 0925   BILITOT 0.8 04/23/2015 0925  PENDING LABS:   RADIOGRAPHIC STUDIES:  Ir Gastrostomy Tube Mod Sed  04/01/2015  INDICATION: History of esophageal cancer. In need of gastrostomy tube placement for enteric nutrition supplementation. EXAM: PULL TROUGH GASTROSTOMY TUBE PLACEMENT COMPARISON:  Radiation planning CT scan - 03/12/2015 MEDICATIONS: Ancef 2 gm IV; Antibiotics were administered within 1 hour of the procedure. Glucagon 1 mg IV CONTRAST:  10 mL of Omnipaque 300 administered into the gastric lumen. ANESTHESIA/SEDATION: Moderate (conscious) sedation was employed during this procedure. A total of Versed 2 mg and Fentanyl 75 mcg was administered intravenously. Moderate Sedation Time: 15 minutes. The patient's level of consciousness and vital signs were monitored continuously by radiology nursing throughout the procedure under my direct supervision. FLUOROSCOPY TIME:  3 minutes 36 seconds (55 mGy) COMPLICATIONS: None immediate. PROCEDURE: Informed written consent was obtained from the patient following explanation of the procedure, risks, benefits and alternatives. A time out was performed prior to the initiation of the procedure. Ultrasound scanning was performed to demarcate the edge of the left lobe of the liver. Maximal barrier sterile technique utilized including caps, mask, sterile gowns, sterile gloves, large sterile drape, hand hygiene and Betadine prep. The left upper quadrant was sterilely  prepped and draped. An oral gastric catheter was inserted into the stomach under fluoroscopy. The existing nasogastric feeding tube was removed. The left costal margin and air and barium opacified transverse colon were identified and avoided. Air was injected into the stomach for insufflation and visualization under fluoroscopy. Under sterile conditions a 17 gauge trocar needle was utilized to access the stomach percutaneously beneath the left subcostal margin after the overlying soft tissues were anesthetized with 1% Lidocaine with epinephrine. Needle position was confirmed within the stomach with aspiration of air and injection of small amount of contrast. A single T tack was deployed for gastropexy. Over an Amplatz guide wire, a 9-French sheath was inserted into the stomach. A snare device was utilized to capture the oral gastric catheter. The snare device was pulled retrograde from the stomach up the esophagus and out the oropharynx. The 20-French pull-through gastrostomy was connected to the snare device and pulled antegrade through the oropharynx down the esophagus into the stomach and then through the percutaneous tract external to the patient. The gastrostomy was assembled externally. Contrast injection confirms position in the stomach. Several spot radiographic images were obtained in various obliquities for documentation. The patient tolerated procedure well without immediate post procedural complication. FINDINGS: After successful fluoroscopic guided placement, the gastrostomy tube is appropriately positioned with internal disc against the ventral aspect of the gastric lumen. IMPRESSION: Successful fluoroscopic insertion of a 20-French pull-through gastrostomy tube. The gastrostomy may be used immediately for medication administration and in 24 hrs for the initiation of feeds. Electronically Signed   By: Sandi Mariscal M.D.   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  Oncology history updated.  Weight is down another 2 pounds. Burtis Junes has seen the patient today in follow-up. He recommends 6 cans of nutrition per day. The patient is currently receiving 4 cans per day. Our nursing staff has called the patient's nursing residence and nursing there reports that due to radiation appointments, they have been unable to provide 6 cans of nutrition per day given the long time that the patient is out  of the facility and there avoidance of hindering the patient's desire to eat meals.  I wonder if he is getting 6 cans of nutrition per day on weekends when he does not have any appointments?  The nursing home in coordination with their own nutrition is we'll work on increasing the patient's feedings.  Pre-treatment labs today: CBC diff, CMET.  Labs meet treatment parameters today.  Leukopenia is noted.  ANC is appropriate.  Anemia is stable.  Treatment plan dates are manipulated to accommodate for the Easter holiday season.  Hyperkalemia noted.  Order is placed for 30 grams of Kayexalate at nursing facility today.  Order placed to increase feeding cans to 6 per day (compared to 4 per day) due to continued weight loss.  He has seen Dr. Servando Snare ([facility]) on 04/22/2015 and his note is not yet completed for review. I'll be on the lookout for this.  Return next week for follow-up and cycle #6.  This may be his last cycle based upon what he tells me his completion date for radiation is ("next Thursday").  We will need to confirm this with XRT. I was unable to get in touch with radiation oncology prior to closing time and therefore I sent a message to nursing requesting a call to radiation oncology to confirm completion date of radiation.   THERAPY PLAN:  Treatment as outlined  above.  All questions were answered. The patient knows to call the clinic with any problems, questions or concerns. We can certainly see the patient much sooner if necessary.  Patient and plan discussed with Dr. Ancil Linsey and she is in agreement with the aforementioned.   This note is electronically signed by: Doy Mince 04/23/2015 5:48 PM

## 2015-04-22 NOTE — Progress Notes (Signed)
[address]       [place] [address]             [phone]                    Quantae E Brokaw [facility] Medical Record [id number] Date of Birth: 1945-05-14  Referring: Jani Gravel, MD Primary Care: Jani Gravel, MD Oncology: Dr. Whitney Muse Chief Complaint:    Chief Complaint  Patient presents with  . Esophageal Cancer    eval for possible surgery...chemo/XRT at present..has had ENDOSCOPY, ESOPHAGEAL U/S, PET    History of Present Illness:    [facility] 70 y.o. 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 . 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 He is still able to ambulate with the assistance of a walker or a cane. He lives at [facility] assisted living facility with his wife. Initially he had increasing difficulty swallowing and required placement of a PEG tube. With the supplemental nutrition and the chemotherapy and radiation that he has been receiving he is slowly improved. However his functional status is very limited. He is able to give some history but not many details, and tends to answer all questions as" it's going pretty good"   He will complete his radiation and chemotherapy April 20 and 21st.  Filed Weights   04/22/15 1637  Weight: 200 lb (90.719 kg)   Current Activity/ Functional Status:  Patient is not independent with mobility/ambulation, transfers, ADL's, IADL's.   Zubrod Score: At the time of surgery this patient's most appropriate activity status/level should be described as: []     0    Normal activity, no symptoms []     1    Restricted in physical strenuous activity but ambulatory, able to do out light work [x]     2    Ambulatory and capable of self care, unable to do work activities, up and about               >50 % of waking hours                              []     3    Only limited self care, in bed greater than 50% of  waking hours []     4    Completely disabled, no self care, confined to bed or chair []     5    Moribund   Past Medical History  Diagnosis Date  . Hypertension   . Hyperlipidemia   . Stroke [facility])     left sided weakness  . Squamous cell carcinoma of esophagus ([place]) 02/26/2015    Past Surgical History  Procedure Laterality Date  . Tonsillectomy    . Cataract extraction w/phaco Left 10/14/2013    Procedure: CATARACT EXTRACTION PHACO AND INTRAOCULAR LENS PLACEMENT LEFT EYE CDE=11.04;  Surgeon: Williams Che, MD;  Location: [facility];  Service: Ophthalmology;  Laterality: Left;  . Cataract extraction w/phaco Right 12/30/2013    Procedure: CATARACT EXTRACTION PHACO AND INTRAOCULAR LENS PLACEMENT; CDE:  110.53;  Surgeon: Williams Che, MD;  Location: [facility];  Service: Ophthalmology;  Laterality: Right;  . Colonoscopy with propofol N/A 02/10/2015    Procedure: COLONOSCOPY WITH PROPOFOL;  Surgeon: Danie Binder, MD;  Location: [facility];  Service: Endoscopy;  Laterality: N/A;  1030  . Esophagogastroduodenoscopy (egd) with propofol N/A 02/10/2015    Procedure: ESOPHAGOGASTRODUODENOSCOPY (EGD) WITH PROPOFOL;  Surgeon: Danie Binder, MD;  Location: [facility];  Service: Endoscopy;  Laterality: N/A;  . Polypectomy N/A 02/10/2015    Procedure: POLYPECTOMY;  Surgeon: Danie Binder, MD;  Location: [facility];  Service: Endoscopy;  Laterality: N/A;  cecum x 1 and ascending colon polyp x1, Medium sigmoid colon polyp, Medium and Large sigmoid colon polyps  . Biopsy N/A 02/10/2015    Procedure: BIOPSY;  Surgeon: Danie Binder, MD;  Location: [facility];  Service: Endoscopy;  Laterality: N/A;  Esophageal mass biopsies, Gastric biopsies  . Eus N/A 03/05/2015    Procedure: UPPER ENDOSCOPIC ULTRASOUND (EUS) RADIAL;  Surgeon: Milus Banister, MD;  Location: [facility];  Service: Endoscopy;  Laterality: N/A;  . Portacath placement  03/11/2015    Family History  Problem Relation Age of Onset  .  Colon cancer Neg Hx     Social History   Social History  . Marital Status: Married    Spouse Name: N/A  . Number of Children: N/A  . Years of Education: N/A   Occupational History  . Not on file.   Social History Main Topics  . Smoking status: Former Smoker -- 1.00 packs/day for 20 years    Types: Cigarettes    Quit date: 10/03/2012  . Smokeless tobacco: Never Used  . Alcohol Use: No     Comment: Denies current ETOH since 12/2013; Previously 1 pint per day per pt 12/24/2013 x 8-10 years.  . Drug Use: No  . Sexual Activity: No   Other Topics Concern  . Not on file   Social History Narrative    History  Smoking status  . Former Smoker -- 1.00 packs/day for 20 years  . Types: Cigarettes  . Quit date: 10/03/2012  Smokeless tobacco  . Never Used    History  Alcohol Use No    Comment: Denies current ETOH since 12/2013; Previously 1 pint per day per pt 12/24/2013 x 8-10 years.     No Known Allergies  Current Outpatient Prescriptions  Medication Sig Dispense Refill  . acetaminophen (TYLENOL) 500 MG tablet Take 500 mg by mouth every 8 (eight) hours as needed (VIA G-TUBE FOR AS NEEDED FOR PAIN).    Marland Kitchen atorvastatin (LIPITOR) 10 MG tablet Take 10 mg by mouth daily at 6 PM.     . CARBOPLATIN IV Inject into the vein. To be given once a week concurrently with XRT    . cyanocobalamin (,VITAMIN B-12,) 1000 MCG/ML injection Inject 1,000 mcg into the muscle every 30 (thirty) days.    . folic acid (FOLVITE) Q000111Q MCG tablet Take 400 mcg by mouth daily.    . hydrALAZINE (APRESOLINE) 10 MG tablet Take 1 tablet (10 mg total) by mouth every 8 (eight) hours. (Patient taking differently: Take 10 mg by mouth 2 (two) times daily. ) 90 tablet 1  . lidocaine-prilocaine (EMLA) cream Apply a quarter size amount to port site 1 hour prior to chemo. Do not rub in. Cover with plastic wrap. 30 g 3  . lisinopril (PRINIVIL,ZESTRIL) 10 MG tablet Take 10 mg by mouth daily.    . metoprolol tartrate  (LOPRESSOR) 25 MG tablet Take 12.5 mg by mouth 2 (two) times daily.    . Multiple Vitamin (MULTIVITAMIN WITH MINERALS) TABS tablet Take 1 tablet by mouth daily. 30 tablet 1  . Nutritional Supplements (  FEEDING SUPPLEMENT, OSMOLITE 1.2 CAL,) LIQD 1 can 4x daily (1000, 1400, 1800, 2200) Flush w/ 50 mls before and after each can: 400 extra ccs of fluid.  0  . nystatin (MYCOSTATIN) 100000 UNIT/ML suspension 5 mLs every 4 (four) hours as needed.     Marland Kitchen PACLitaxel (TAXOL IV) Inject into the vein. Reported on 04/22/2015    . polyethylene glycol (MIRALAX / GLYCOLAX) packet Take 17 g by mouth daily.    . prochlorperazine (COMPAZINE) 10 MG tablet Take 1 tablet (10 mg total) by mouth every 6 (six) hours as needed for nausea or vomiting. 30 tablet 2  . promethazine (PHENERGAN) 25 MG tablet Take 12.5 mg by mouth every 6 (six) hours as needed for nausea or vomiting.     Marland Kitchen PROMETHAZINE HCL IJ Inject 12.5 mg as directed every 6 (six) hours as needed (Nausea and vomitting).    Marland Kitchen senna-docusate (SENOKOT-S) 8.6-50 MG per tablet Take 1 tablet by mouth daily.    . Vitamin D, Ergocalciferol, (DRISDOL) 50000 units CAPS capsule Take 50,000 Units by mouth every 30 (thirty) days.    . ENSURE PLUS (ENSURE PLUS) LIQD Take 237 mLs by mouth 2 (two) times daily.    . ondansetron (ZOFRAN ODT) 8 MG disintegrating tablet Take 1 tablet (8 mg total) by mouth every 8 (eight) hours as needed for nausea or vomiting. (Patient not taking: Reported on 04/17/2015) 30 tablet 2  . oxyCODONE-acetaminophen (PERCOCET/ROXICET) 5-325 MG tablet Reported on 04/22/2015     No current facility-administered medications for this visit.      Review of Systems:     Cardiac Review of Systems: Y or N  Chest Pain [ n   ]  Resting SOB [ n  ] Exertional SOB  [n  ]  Orthopnea [ n ]   Pedal Edema [ n  ]    Palpitations [n  ] Syncope  [ n ]   Presyncope [  n ]  General Review of Systems: [Y] = yes [  ]=no Constitional: recent weight change [  ];  Wt loss over  the last 3 months [   ] anorexia Blue.Reese  ]; fatigue Blue.Reese  ]; nausea [  ]; night sweats [  ]; fever [  ]; or chills [  ];          Dental: poor dentition[  ]; Last Dentist visit:   Eye : blurred vision [  ]; diplopia [   ]; vision changes [  ];  Amaurosis fugax[  ]; Resp: cough [  ];  wheezing[  ];  hemoptysis[  ]; shortness of breath[  ]; paroxysmal nocturnal dyspnea[  ]; dyspnea on exertion[  ]; or orthopnea[  ];  GI:  gallstones[  ], vomiting[  ];  dysphagia[  ]; melena[  ];  hematochezia [  ]; heartburn[  ];   Hx of  Colonoscopy[  ]; GU: kidney stones [  ]; hematuria[  ];   dysuria [  ];  nocturia[  ];  history of     obstruction [  ]; urinary frequency [  ]             Skin: rash, swelling[  ];, hair loss[  ];  peripheral edema[  ];  or itching[  ]; Musculosketetal: myalgias[  ];  joint swelling[  ];  joint erythema[  ];  joint pain[  ];  back pain[  ];  Heme/Lymph: bruising[  ];  bleeding[  ];  anemia[  ];  Neuro: TIA[  ];  Mishka.Peer  ];  stroke[ y ];  vertigo[  ];  seizures[  ];   paresthesias[  ];  difficulty walking[  ];  Psych:depression[  ]; anxiety[  ];  Endocrine: diabetes[  ];  thyroid dysfunction[  ];  Immunizations: Flu up to date [  ]; Pneumococcal up to date [  ];  Other:  Physical Exam: BP 119/73 mmHg  Pulse 81  Resp 16  Ht 6' (1.829 m)  Wt 200 lb (90.719 kg)  BMI 27.12 kg/m2  SpO2 98%  PHYSICAL EXAMINATION: General appearance: alert, cooperative, appears older than stated age, no distress and slowed mentation Head: Normocephalic, without obvious abnormality, atraumatic Neck: no adenopathy, no carotid bruit, no JVD, supple, symmetrical, trachea midline and thyroid not enlarged, symmetric, no tenderness/mass/nodules Lymph nodes: Cervical, supraclavicular, and axillary nodes normal. Resp: clear to auscultation bilaterally Back: symmetric, no curvature. ROM normal. No CVA tenderness. Cardio: regular rate and rhythm, S1, S2 normal, no murmur, click, rub or gallop GI: soft,  non-tender; bowel sounds normal; no masses,  no organomegaly and Well-healed PEG tube just to the left of the midline abdomen Extremities: extremities normal, atraumatic, no cyanosis or edema and Palpable pulses at the ankles Neurologic: Patient has significant left arm and left leg weakness, he is able to walk with assistance of a walker but slowly.  Diagnostic Studies & Laboratory data:     Recent Radiology Findings:   CLINICAL DATA: Initial treatment strategy for squamous cell esophageal carcinoma. Initial diagnosis by endoscopy 02/10/2015  EXAM: NUCLEAR MEDICINE PET SKULL BASE TO THIGH  TECHNIQUE: For 9.9 mCi F-18 FDG was injected intravenously. Full-ring PET imaging was performed from the skull base to thigh after the radiotracer. CT data was obtained and used for attenuation correction and anatomic localization.  FASTING BLOOD GLUCOSE: Value: 99 mg/dl  COMPARISON: MR brain from 09/06/2012  FINDINGS: NECK  No hypermetabolic lymph nodes in the neck. Mild chronic right maxillary sinusitis.  CHEST  Abnormal mid thoracic esophageal region of hypermetabolism extends over a 6 cm vertical excursion, with maximum standard uptake value of 19.1. There is a small metal clip or fiducial along mass hypermetabolic region. No other hypermetabolic activity in the chest.  Linear subsegmental atelectasis or scarring in the posterior basal segment right lower lobe. Pleural calcification along the left hemidiaphragm, significance uncertain.  ABDOMEN/PELVIS  Faintly high activity in both adrenal glands without visible adrenal mass, right adrenal gland maximum SUV 6.8 and left adrenal gland 7.2.  Scattered physiologic activity in the bowel. Prominent prostate gland.  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 I am skeptical of osseous metastatic disease given the overall pattern and the very faint increased metabolic activity.  IMPRESSION: 1. Esophageal mass extends longitudinally over about 6 cm and is associated with very hypermetabolic activity. No definite separable adenopathy in the chest or other metastatic disease identified. 2. 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 3. Faintly increased activity in both adrenal glands without adrenal mass, likely physiologic/incidental. 4. Prominent prostate gland. 5. Mild chronic right maxillary sinusitis.   Electronically Signed  By: Van Clines M.D.  On: 02/25/2015 12:51   I have independently reviewed the above radiologic studies.   ESOPHAGUS: MASS LOCATED 25 CM -31  CM FROM THE TEETH. NARROWING LUMEN TO 10-11 CM. TEAR CREATED IN MID ESOPHAGUS. TWO CLIPS PLACED TO CONTROL BLEEDING. 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 SMALL HIATAL HERNIA. MILD ERYTHEMA/EDEMA IN ANTRUM. COLD BIOPSIES OBTAINED. DUODENUM: Mild duodenal inflammation was found in the duodenal bulb. The duodenal mucosa showed no abnormalities in the 2nd part of the duodenum. COMPLICATIONS: There were no immediate complications. ENDOSCOPIC IMPRESSION: 1. ANEMIA DUE TO ESOPHAGEAL MASS, CRI, AND COLON POLYPS 2. MILD GASTRITIS AND DUODENITIS  Endoscopic findings: 1. Clearly malignant, circumferential mass in the esophagus. The proximal edge of this mass is located 26cm from the incisors. I could not advance the large diameter echoendoscope through the malignant stircture and so could not assess much more than just to proximal aspect of the tumor. EUS findings (incomplete examination, see above): 1. I could only examine the proximal aspect of the cancer due to the fairly snug malignant  stricture (lumen 6-67mm across). The observed tumor invades into the muscularis propria but not clearly through that layer (uT2) 2. Incomplete evaluation for local adenopathy (uNX) ENDOSCOPIC IMPRESSION: The maligant, circumferential mass is causing a 27mm lumen stricture. I could not advance the echoendoscope through this stricture and so could only examine the proximal aspect of the tumor. Proximal edge of mass is at 26cm from the incisors and the limited EUS evaluation shows uT2Nx invasion. Given the limitations described above, this may be an underestimate of the true staging. RECOMMENDATIONS: I will communicate these results with Drs. Kefalas, Pennland and Fields. _______________________________ eSigned: Recent Lab Findings: Lab Results  Component Value Date   WBC 4.1 04/17/2015   HGB 11.2* 04/17/2015   HCT 32.9* 04/17/2015   PLT 149* 04/17/2015   GLUCOSE 107* 04/17/2015   CHOL 172 09/07/2012   TRIG 123 09/07/2012   HDL 57 09/07/2012   LDLCALC 90 09/07/2012   ALT 19 04/17/2015   AST 20 04/17/2015   NA 134* 04/17/2015   K 4.9 04/17/2015   CL 102 04/17/2015   CREATININE 1.11 04/17/2015   BUN 17 04/17/2015   CO2 24 04/17/2015   INR 1.16 04/01/2015   HGBA1C 5.7* 09/06/2012   PATH:[id number] Diagnosis 1. Colon, polyp(s), cecal, ascending - TUBULAR ADENOMA (X1). - NO HIGH GRADE DYSPLASIA OR MALIGNANCY IDENTIFIED. 2. Colon, polyp(s), medium sigmoid - TUBULAR ADENOMA, 1 CM. - NO HIGH GRADE DYSPLASIA OR MALIGNANCY IDENTIFIED. - SEE COMMENT. 3. 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 - HIGH GRADE DYSPLASIA INVOLVES APPROXIMATELY 10 TO 15% OF THE POLYP. - CAUTERIZED EDGES ARE FREE OF HIGH GRADE DYSPLASIA BUT ARE INVOLVED WITH ADENOMATOUS CHANGE. - ADDITIONAL FRAGMENTS OF TRADITIONAL SERRATED ADENOMA (1.9 CM IN AGGREGATE) WITH HIGH GRADE DYSPLASIA INVOLVING APPROXIMATELY 20% OF ADDITIONAL ADENOMATOUS FRAGMENTS. 4. Stomach,  biopsy - BENIGN GASTRIC MUCOSA WITH CHRONIC INACTIVE GASTRITIS. - WARTHIN-STARRY STAIN IS NEGATIVE FOR HELICOBACTER PYLORI. - NO INTESTINAL METAPLASIA, DYSPLASIA, OR MALIGNANCY. 5. Esophagus, biopsy, mass - INVASIVE SQUAMOUS CELL CARCINOMA.   Assessment / Plan:   Patient with advanced stage squamous cell carcinoma the mid esophagus,, at least T2 by ultrasound likely under stage because of incompleteness of esophageal ultrasound I discussed with the patient and his daughter what is involved with esophagectomy pros and cons, overall the patient has a very low functional status and it is unclear of his understanding of the underlying disease process. With the current therapy he has significantly improved by both the physician's history and by his history. I suspect that we will not benefit him much by undertaking  a significant operation with his limited functional status however before making this decision of plan to see him back in 4-5 weeks with a follow-up CT scan of the chest and abdomen and further discussed with him options.     I  spent 40 minutes counseling the patient face to face and 50% or more the  time was spent in counseling and coordination of care. The total time spent in the appointment was 60 minutes.  Grace Isaac MD      [address] [place] [address] Office [phone]   Beeper [phone]  04/22/2015 4:54 PM

## 2015-04-23 ENCOUNTER — Encounter (HOSPITAL_BASED_OUTPATIENT_CLINIC_OR_DEPARTMENT_OTHER): Payer: Medicare Other

## 2015-04-23 ENCOUNTER — Other Ambulatory Visit: Payer: Self-pay | Admitting: *Deleted

## 2015-04-23 ENCOUNTER — Encounter: Payer: Self-pay | Admitting: Dietician

## 2015-04-23 ENCOUNTER — Encounter (HOSPITAL_BASED_OUTPATIENT_CLINIC_OR_DEPARTMENT_OTHER): Payer: Medicare Other | Admitting: Oncology

## 2015-04-23 ENCOUNTER — Encounter (HOSPITAL_COMMUNITY): Payer: Self-pay | Admitting: Oncology

## 2015-04-23 VITALS — BP 108/72 | HR 86 | Temp 98.3°F | Resp 18 | Wt 195.4 lb

## 2015-04-23 VITALS — BP 118/76 | Temp 98.9°F | Resp 18 | Ht 72.0 in

## 2015-04-23 DIAGNOSIS — Z5111 Encounter for antineoplastic chemotherapy: Secondary | ICD-10-CM | POA: Diagnosis not present

## 2015-04-23 DIAGNOSIS — C154 Malignant neoplasm of middle third of esophagus: Secondary | ICD-10-CM

## 2015-04-23 DIAGNOSIS — E875 Hyperkalemia: Secondary | ICD-10-CM | POA: Diagnosis not present

## 2015-04-23 DIAGNOSIS — C159 Malignant neoplasm of esophagus, unspecified: Secondary | ICD-10-CM

## 2015-04-23 DIAGNOSIS — R634 Abnormal weight loss: Secondary | ICD-10-CM | POA: Diagnosis not present

## 2015-04-23 LAB — CBC WITH DIFFERENTIAL/PLATELET
Basophils Absolute: 0 10*3/uL (ref 0.0–0.1)
Basophils Relative: 0 %
Eosinophils Absolute: 0 10*3/uL (ref 0.0–0.7)
Eosinophils Relative: 0 %
HEMATOCRIT: 33.7 % — AB (ref 39.0–52.0)
HEMOGLOBIN: 11.5 g/dL — AB (ref 13.0–17.0)
LYMPHS PCT: 11 %
Lymphs Abs: 0.4 10*3/uL — ABNORMAL LOW (ref 0.7–4.0)
MCH: 31.9 pg (ref 26.0–34.0)
MCHC: 34.1 g/dL (ref 30.0–36.0)
MCV: 93.6 fL (ref 78.0–100.0)
MONO ABS: 0.2 10*3/uL (ref 0.1–1.0)
MONOS PCT: 5 %
NEUTROS ABS: 2.9 10*3/uL (ref 1.7–7.7)
Neutrophils Relative %: 84 %
Platelets: 128 10*3/uL — ABNORMAL LOW (ref 150–400)
RBC: 3.6 MIL/uL — ABNORMAL LOW (ref 4.22–5.81)
RDW: 12.8 % (ref 11.5–15.5)
WBC: 3.5 10*3/uL — ABNORMAL LOW (ref 4.0–10.5)

## 2015-04-23 LAB — COMPREHENSIVE METABOLIC PANEL
ALT: 20 U/L (ref 17–63)
ANION GAP: 8 (ref 5–15)
AST: 19 U/L (ref 15–41)
Albumin: 4 g/dL (ref 3.5–5.0)
Alkaline Phosphatase: 72 U/L (ref 38–126)
BILIRUBIN TOTAL: 0.8 mg/dL (ref 0.3–1.2)
BUN: 24 mg/dL — AB (ref 6–20)
CALCIUM: 9.2 mg/dL (ref 8.9–10.3)
CO2: 25 mmol/L (ref 22–32)
CREATININE: 1.12 mg/dL (ref 0.61–1.24)
Chloride: 102 mmol/L (ref 101–111)
GFR calc Af Amer: >60 mL/min (ref >60)
GLUCOSE: 114 mg/dL — AB (ref 65–99)
POTASSIUM: 5.3 mmol/L — AB (ref 3.5–5.1)
Sodium: 135 mmol/L (ref 135–145)
Total Protein: 7.5 g/dL (ref 6.5–8.1)

## 2015-04-23 MED ORDER — SODIUM CHLORIDE 0.9 % IV SOLN
Freq: Once | INTRAVENOUS | Status: AC
  Administered 2015-04-23: 11:00:00 via INTRAVENOUS

## 2015-04-23 MED ORDER — HEPARIN SOD (PORK) LOCK FLUSH 100 UNIT/ML IV SOLN
500.0000 [IU] | Freq: Once | INTRAVENOUS | Status: AC | PRN
  Administered 2015-04-23: 500 [IU]
  Filled 2015-04-23: qty 5

## 2015-04-23 MED ORDER — SODIUM CHLORIDE 0.9% FLUSH
10.0000 mL | INTRAVENOUS | Status: DC | PRN
  Administered 2015-04-23: 10 mL
  Filled 2015-04-23: qty 10

## 2015-04-23 MED ORDER — PALONOSETRON HCL INJECTION 0.25 MG/5ML
0.2500 mg | Freq: Once | INTRAVENOUS | Status: AC
  Administered 2015-04-23: 0.25 mg via INTRAVENOUS
  Filled 2015-04-23: qty 5

## 2015-04-23 MED ORDER — FAMOTIDINE IN NACL 20-0.9 MG/50ML-% IV SOLN
20.0000 mg | Freq: Once | INTRAVENOUS | Status: AC
  Administered 2015-04-23: 20 mg via INTRAVENOUS
  Filled 2015-04-23: qty 50

## 2015-04-23 MED ORDER — DIPHENHYDRAMINE HCL 50 MG/ML IJ SOLN
50.0000 mg | Freq: Once | INTRAMUSCULAR | Status: AC
  Administered 2015-04-23: 50 mg via INTRAVENOUS
  Filled 2015-04-23: qty 1

## 2015-04-23 MED ORDER — PACLITAXEL CHEMO INJECTION 300 MG/50ML
45.0000 mg/m2 | Freq: Once | INTRAVENOUS | Status: AC
  Administered 2015-04-23: 96 mg via INTRAVENOUS
  Filled 2015-04-23: qty 16

## 2015-04-23 MED ORDER — SODIUM CHLORIDE 0.9 % IV SOLN
20.0000 mg | Freq: Once | INTRAVENOUS | Status: AC
  Administered 2015-04-23: 20 mg via INTRAVENOUS
  Filled 2015-04-23: qty 2

## 2015-04-23 MED ORDER — SODIUM CHLORIDE 0.9 % IV SOLN
206.8000 mg | Freq: Once | INTRAVENOUS | Status: AC
  Administered 2015-04-23: 210 mg via INTRAVENOUS
  Filled 2015-04-23: qty 21

## 2015-04-23 NOTE — Progress Notes (Signed)
Briefly saw pt. Unfortunately, he reports he has only been receiving 4 cans of TF instead of the 6 cans that was reccommended at last encounter.   Asked PA  to highlight on appointment instructions the need for staff at SNF to increase his TF to 6 cans due to continued weight loss.   Pt states he still eats 50% of each of his meals. Denies any intolerance to TF  Wt Readings from Last 10 Encounters:  04/23/15 195 lb 6.4 oz (88.633 kg)  04/22/15 200 lb (90.719 kg)  04/17/15 197 lb 12.8 oz (89.721 kg)  04/10/15 199 lb 1.6 oz (90.311 kg)  04/03/15 199 lb 3.2 oz (90.357 kg)  04/03/15 201 lb (91.173 kg)  03/27/15 203 lb 9.6 oz (92.352 kg)  03/11/15 205 lb (92.987 kg)  03/06/15 205 lb 3.2 oz (93.078 kg)  03/05/15 209 lb (94.802 kg)    Burtis Junes RD, LDN Nutrition Pager: [phone] 04/23/2015 2:14 PM

## 2015-04-23 NOTE — Progress Notes (Signed)
Tolerated well

## 2015-04-23 NOTE — Patient Instructions (Signed)
[facility] Discharge Instructions for Patients Receiving Chemotherapy   Beginning January 23rd 2017 lab work for the [facility] will be done in the  Main lab at [facility] on 1st floor. If you have a lab appointment with the [facility] please come in thru the  Main Entrance and check in at the main information desk   Today you received the following chemotherapy agents Carboplatin and taxol  PT seen and evaluated by [name], PA today with chemo treatment. New order for Kayexalate 64mL today at [place]. Can feedings to 6 cans/day. Return in 1 week for follow-up labs and treatment. Report called to [facility] (SNF) to his nurse today for new orders  To help prevent nausea and vomiting after your treatment, we encourage you to take your nausea medication  If you develop nausea and vomiting, or diarrhea that is not controlled by your medication, call the clinic.  The clinic phone number is [phone]. Office hours are Monday-Friday 8:30am-5:00pm.  BELOW ARE SYMPTOMS THAT SHOULD BE REPORTED IMMEDIATELY:  *FEVER GREATER THAN 101.0 F  *CHILLS WITH OR WITHOUT FEVER  NAUSEA AND VOMITING THAT IS NOT CONTROLLED WITH YOUR NAUSEA MEDICATION  *UNUSUAL SHORTNESS OF BREATH  *UNUSUAL BRUISING OR BLEEDING  TENDERNESS IN MOUTH AND THROAT WITH OR WITHOUT PRESENCE OF ULCERS  *URINARY PROBLEMS  *BOWEL PROBLEMS  UNUSUAL RASH Items with * indicate a potential emergency and should be followed up as soon as possible. If you have an emergency after office hours please contact your primary care physician or go to the nearest emergency department.  Please call the clinic during office hours if you have any questions or concerns.   You may also contact the Patient Navigator at [phone] should you have any questions or need assistance in obtaining follow up care.      Resources For Cancer Patients and their Caregivers ? American Cancer Society: Can assist  with transportation, wigs, general needs, runs Look Good Feel Better.        (419)216-1922 ? Cancer Care: Provides financial assistance, online support groups, medication/co-pay assistance.  1-800-813-HOPE 367-753-1950) ? [place] Assists [place] cancer patients and their families through emotional , educational and financial support.  267-010-3384 ? [place] DSS Where to apply for food stamps, Medicaid and utility assistance. (434)816-5186 ? [facility]: Transportation to medical appointments. (878)773-2901 ? Social Security Administration: May apply for disability if have a Stage IV cancer. 4377423758 765-201-4721 ? [employer], Disability and Transit Services: Assists with nutrition, care and transit needs. [phone]

## 2015-04-23 NOTE — Patient Instructions (Signed)
[facility] Discharge Instructions  RECOMMENDATIONS MADE BY THE CONSULTANT AND ANY TEST RESULTS WILL BE SENT TO YOUR REFERRING PHYSICIAN.  PT seen and evaluated by [name], PA today with chemo treatment. New order for Kayexalate 66mL today at [place]. Can feedings to 6 cans/day. Return in 1 week for follow-up labs and treatment. Report called to [facility] (SNF) to his nurse today for new orders.  Thank you for choosing [facility] at [facility] to provide your oncology and hematology care.  To afford each patient quality time with our provider, please arrive at least 15 minutes before your scheduled appointment time.   Beginning January 23rd 2017 lab work for the [facility] will be done in the  Main lab at [facility] on 1st floor. If you have a lab appointment with the [facility] please come in thru the  Main Entrance and check in at the main information desk  You need to re-schedule your appointment should you arrive 10 or more minutes late.  We strive to give you quality time with our providers, and arriving late affects you and other patients whose appointments are after yours.  Also, if you no show three or more times for appointments you may be dismissed from the clinic at the providers discretion.     Again, thank you for choosing [facility].  Our hope is that these requests will decrease the amount of time that you wait before being seen by our physicians.       _____________________________________________________________  Should you have questions after your visit to [facility], please contact our office at [phone] between the hours of 8:30 a.m. and 4:30 p.m.  Voicemails left after 4:30 p.m. will not be returned until the following business day.  For prescription refill requests, have your pharmacy contact our office.         Resources For Cancer Patients and their  Caregivers ? American Cancer Society: Can assist with transportation, wigs, general needs, runs Look Good Feel Better.        ([phone] ? Cancer Care: Provides financial assistance, online support groups, medication/co-pay assistance.  1-800-813-HOPE 6813239871) ? [place] Assists [place] cancer patients and their families through emotional , educational and financial support.  (561) 320-7833 ? [place] DSS Where to apply for food stamps, Medicaid and utility assistance. 805-762-9790 ? [facility]: Transportation to medical appointments. 7575667731 ? Social Security Administration: May apply for disability if have a Stage IV cancer. [phone] [phone])[phone] ? [employer], Disability and Transit Services: Assists with nutrition, care and transit needs. [phone]

## 2015-05-01 ENCOUNTER — Encounter (HOSPITAL_COMMUNITY): Payer: Medicare Other

## 2015-05-01 ENCOUNTER — Encounter (HOSPITAL_BASED_OUTPATIENT_CLINIC_OR_DEPARTMENT_OTHER): Payer: Medicare Other

## 2015-05-01 ENCOUNTER — Ambulatory Visit (HOSPITAL_COMMUNITY): Payer: Medicare Other | Admitting: Hematology & Oncology

## 2015-05-01 ENCOUNTER — Other Ambulatory Visit (HOSPITAL_COMMUNITY): Payer: Self-pay | Admitting: Oncology

## 2015-05-01 VITALS — BP 118/71 | HR 82 | Temp 99.3°F | Resp 18

## 2015-05-01 DIAGNOSIS — Z5189 Encounter for other specified aftercare: Secondary | ICD-10-CM

## 2015-05-01 DIAGNOSIS — Z5111 Encounter for antineoplastic chemotherapy: Secondary | ICD-10-CM

## 2015-05-01 DIAGNOSIS — C159 Malignant neoplasm of esophagus, unspecified: Secondary | ICD-10-CM

## 2015-05-01 LAB — COMPREHENSIVE METABOLIC PANEL
ALBUMIN: 3.6 g/dL (ref 3.5–5.0)
ALK PHOS: 55 U/L (ref 38–126)
ALT: 16 U/L — AB (ref 17–63)
AST: 20 U/L (ref 15–41)
Anion gap: 10 (ref 5–15)
BUN: 23 mg/dL — ABNORMAL HIGH (ref 6–20)
CALCIUM: 9.3 mg/dL (ref 8.9–10.3)
CHLORIDE: 100 mmol/L — AB (ref 101–111)
CO2: 24 mmol/L (ref 22–32)
CREATININE: 1.2 mg/dL (ref 0.61–1.24)
GFR calc non Af Amer: 59 mL/min — ABNORMAL LOW (ref >60)
GLUCOSE: 114 mg/dL — AB (ref 65–99)
Potassium: 4.4 mmol/L (ref 3.5–5.1)
SODIUM: 134 mmol/L — AB (ref 135–145)
Total Bilirubin: 0.4 mg/dL (ref 0.3–1.2)
Total Protein: 7.2 g/dL (ref 6.5–8.1)

## 2015-05-01 LAB — CBC WITH DIFFERENTIAL/PLATELET
BASOS ABS: 0 10*3/uL (ref 0.0–0.1)
BASOS PCT: 0 %
EOS ABS: 0 10*3/uL (ref 0.0–0.7)
EOS PCT: 1 %
HCT: 30.9 % — ABNORMAL LOW (ref 39.0–52.0)
HEMOGLOBIN: 10.7 g/dL — AB (ref 13.0–17.0)
LYMPHS ABS: 0.5 10*3/uL — AB (ref 0.7–4.0)
Lymphocytes Relative: 22 %
MCH: 32.3 pg (ref 26.0–34.0)
MCHC: 34.6 g/dL (ref 30.0–36.0)
MCV: 93.4 fL (ref 78.0–100.0)
Monocytes Absolute: 0.1 10*3/uL (ref 0.1–1.0)
Monocytes Relative: 6 %
NEUTROS PCT: 71 %
Neutro Abs: 1.5 10*3/uL — ABNORMAL LOW (ref 1.7–7.7)
PLATELETS: 94 10*3/uL — AB (ref 150–400)
RBC: 3.31 MIL/uL — AB (ref 4.22–5.81)
RDW: 13.1 % (ref 11.5–15.5)
WBC: 2.1 10*3/uL — AB (ref 4.0–10.5)

## 2015-05-01 MED ORDER — PEGFILGRASTIM 6 MG/0.6ML ~~LOC~~ PSKT
6.0000 mg | PREFILLED_SYRINGE | Freq: Once | SUBCUTANEOUS | Status: AC
  Administered 2015-05-01: 6 mg via SUBCUTANEOUS

## 2015-05-01 MED ORDER — PEGFILGRASTIM 6 MG/0.6ML ~~LOC~~ PSKT
PREFILLED_SYRINGE | SUBCUTANEOUS | Status: AC
  Filled 2015-05-01: qty 0.6

## 2015-05-01 MED ORDER — DEXAMETHASONE SODIUM PHOSPHATE 100 MG/10ML IJ SOLN
20.0000 mg | Freq: Once | INTRAMUSCULAR | Status: AC
  Administered 2015-05-01: 20 mg via INTRAVENOUS
  Filled 2015-05-01: qty 2

## 2015-05-01 MED ORDER — PACLITAXEL CHEMO INJECTION 300 MG/50ML
45.0000 mg/m2 | Freq: Once | INTRAVENOUS | Status: AC
  Administered 2015-05-01: 96 mg via INTRAVENOUS
  Filled 2015-05-01: qty 16

## 2015-05-01 MED ORDER — FAMOTIDINE IN NACL 20-0.9 MG/50ML-% IV SOLN
INTRAVENOUS | Status: AC
  Filled 2015-05-01: qty 50

## 2015-05-01 MED ORDER — SODIUM CHLORIDE 0.9 % IV SOLN
196.4000 mg | Freq: Once | INTRAVENOUS | Status: AC
  Administered 2015-05-01: 200 mg via INTRAVENOUS
  Filled 2015-05-01: qty 20

## 2015-05-01 MED ORDER — HEPARIN SOD (PORK) LOCK FLUSH 100 UNIT/ML IV SOLN
500.0000 [IU] | Freq: Once | INTRAVENOUS | Status: AC | PRN
  Administered 2015-05-01: 500 [IU]
  Filled 2015-05-01 (×2): qty 5

## 2015-05-01 MED ORDER — DIPHENHYDRAMINE HCL 50 MG/ML IJ SOLN
50.0000 mg | Freq: Once | INTRAMUSCULAR | Status: AC
  Administered 2015-05-01: 50 mg via INTRAVENOUS

## 2015-05-01 MED ORDER — FAMOTIDINE IN NACL 20-0.9 MG/50ML-% IV SOLN
20.0000 mg | Freq: Once | INTRAVENOUS | Status: AC
  Administered 2015-05-01: 20 mg via INTRAVENOUS

## 2015-05-01 MED ORDER — DIPHENHYDRAMINE HCL 50 MG/ML IJ SOLN
INTRAMUSCULAR | Status: AC
  Filled 2015-05-01: qty 1

## 2015-05-01 MED ORDER — PEGFILGRASTIM 6 MG/0.6ML ~~LOC~~ PSKT: 6.0000 mg | PREFILLED_SYRINGE | Freq: Once | SUBCUTANEOUS | Status: DC

## 2015-05-01 MED ORDER — PALONOSETRON HCL INJECTION 0.25 MG/5ML
INTRAVENOUS | Status: AC
  Filled 2015-05-01: qty 5

## 2015-05-01 MED ORDER — PALONOSETRON HCL INJECTION 0.25 MG/5ML
0.2500 mg | Freq: Once | INTRAVENOUS | Status: AC
  Administered 2015-05-01: 0.25 mg via INTRAVENOUS

## 2015-05-01 MED ORDER — SODIUM CHLORIDE 0.9 % IV SOLN
Freq: Once | INTRAVENOUS | Status: AC
  Administered 2015-05-01: 13:00:00 via INTRAVENOUS

## 2015-05-01 MED ORDER — SODIUM CHLORIDE 0.9% FLUSH: 10.0000 mL | INTRAVENOUS | Status: DC | PRN

## 2015-05-01 NOTE — Progress Notes (Signed)
Patient tolerated infusion well.  VSS.  Wheeled out in a wheelchair.

## 2015-05-01 NOTE — Patient Instructions (Signed)
[facility] Discharge Instructions for Patients Receiving Chemotherapy   Beginning January 23rd 2017 lab work for the [facility] will be done in the  Main lab at [facility] on 1st floor. If you have a lab appointment with the [place] please come in thru the  Main Entrance and check in at the main information desk   Today you received the following chemotherapy agents: carbo and taxol. Neulasta applied to left arm, remove at 8pm Saturday.     If you develop nausea and vomiting, or diarrhea that is not controlled by your medication, call the clinic.  The clinic phone number is [phone]. Office hours are Monday-Friday 8:30am-5:00pm.  BELOW ARE SYMPTOMS THAT SHOULD BE REPORTED IMMEDIATELY:  *FEVER GREATER THAN 101.0 F  *CHILLS WITH OR WITHOUT FEVER  NAUSEA AND VOMITING THAT IS NOT CONTROLLED WITH YOUR NAUSEA MEDICATION  *UNUSUAL SHORTNESS OF BREATH  *UNUSUAL BRUISING OR BLEEDING  TENDERNESS IN MOUTH AND THROAT WITH OR WITHOUT PRESENCE OF ULCERS  *URINARY PROBLEMS  *BOWEL PROBLEMS  UNUSUAL RASH Items with * indicate a potential emergency and should be followed up as soon as possible. If you have an emergency after office hours please contact your primary care physician or go to the nearest emergency department.  Please call the clinic during office hours if you have any questions or concerns.   You may also contact the Patient Navigator at [phone] should you have any questions or need assistance in obtaining follow up care.      Resources For Cancer Patients and their Caregivers ? American Cancer Society: Can assist with transportation, wigs, general needs, runs Look Good Feel Better.        ([phone] ? Cancer Care: Provides financial assistance, online support groups, medication/co-pay assistance.  1-800-813-HOPE (603)844-1071) ? [place] Assists [place] cancer patients and their families through  emotional , educational and financial support.  825-743-7259 ? [place] DSS Where to apply for food stamps, Medicaid and utility assistance. 305-277-6342 ? RCATS: Transportation to medical appointments. 785-672-9202 ? Social Security Administration: May apply for disability if have a Stage IV cancer. [phone] 410-601-1871 ? [employer], Disability and Transit Services: Assists with nutrition, care and transit needs. [phone]

## 2015-05-01 NOTE — Progress Notes (Signed)
This encounter was created in error - please disregard.

## 2015-05-08 ENCOUNTER — Encounter (HOSPITAL_COMMUNITY): Payer: Self-pay | Admitting: Oncology

## 2015-05-08 ENCOUNTER — Encounter (HOSPITAL_COMMUNITY): Payer: Medicare Other

## 2015-05-08 ENCOUNTER — Inpatient Hospital Stay (HOSPITAL_COMMUNITY): Payer: Medicare Other

## 2015-05-08 ENCOUNTER — Encounter (HOSPITAL_BASED_OUTPATIENT_CLINIC_OR_DEPARTMENT_OTHER): Payer: Medicare Other | Admitting: Oncology

## 2015-05-08 VITALS — BP 120/67 | HR 92 | Resp 18 | Wt 197.3 lb

## 2015-05-08 DIAGNOSIS — C159 Malignant neoplasm of esophagus, unspecified: Secondary | ICD-10-CM

## 2015-05-08 LAB — COMPREHENSIVE METABOLIC PANEL
ALK PHOS: 63 U/L (ref 38–126)
ALT: 18 U/L (ref 17–63)
AST: 17 U/L (ref 15–41)
Albumin: 3.6 g/dL (ref 3.5–5.0)
Anion gap: 9 (ref 5–15)
BUN: 16 mg/dL (ref 6–20)
CALCIUM: 9.4 mg/dL (ref 8.9–10.3)
CO2: 24 mmol/L (ref 22–32)
CREATININE: 1.08 mg/dL (ref 0.61–1.24)
Chloride: 101 mmol/L (ref 101–111)
Glucose, Bld: 130 mg/dL — ABNORMAL HIGH (ref 65–99)
Potassium: 4.5 mmol/L (ref 3.5–5.1)
Sodium: 134 mmol/L — ABNORMAL LOW (ref 135–145)
TOTAL PROTEIN: 7.1 g/dL (ref 6.5–8.1)
Total Bilirubin: 0.2 mg/dL — ABNORMAL LOW (ref 0.3–1.2)

## 2015-05-08 LAB — CBC WITH DIFFERENTIAL/PLATELET
Basophils Absolute: 0 10*3/uL (ref 0.0–0.1)
Basophils Relative: 0 %
EOS PCT: 0 %
Eosinophils Absolute: 0 10*3/uL (ref 0.0–0.7)
HCT: 30.2 % — ABNORMAL LOW (ref 39.0–52.0)
HEMOGLOBIN: 10.3 g/dL — AB (ref 13.0–17.0)
LYMPHS ABS: 0.3 10*3/uL — AB (ref 0.7–4.0)
LYMPHS PCT: 8 %
MCH: 32 pg (ref 26.0–34.0)
MCHC: 34.1 g/dL (ref 30.0–36.0)
MCV: 93.8 fL (ref 78.0–100.0)
MONOS PCT: 28 %
Monocytes Absolute: 0.9 10*3/uL (ref 0.1–1.0)
NEUTROS PCT: 64 %
Neutro Abs: 2.1 10*3/uL (ref 1.7–7.7)
Platelets: 101 10*3/uL — ABNORMAL LOW (ref 150–400)
RBC: 3.22 MIL/uL — AB (ref 4.22–5.81)
RDW: 13.8 % (ref 11.5–15.5)
WBC: 3.3 10*3/uL — AB (ref 4.0–10.5)

## 2015-05-08 NOTE — Progress Notes (Signed)
Billy Gravel, MD [address]  [place] [address]  Squamous cell carcinoma of esophagus [facility]) - Plan: CBC with Differential, CBC with Differential, Comprehensive metabolic panel  CURRENT THERAPY: Beginning concomitant chemoradiation with Carboplatin/Paclitaxel on 03/27/2015  INTERVAL HISTORY: Billy Carson 70 y.o. 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    Squamous cell carcinoma of esophagus ([place])   02/10/2015 Procedure Colonoscopy by Dr. Oneida Alar   02/10/2015 Pathology Results Colon, polyp(s), sigmoid - TRADITIONAL SERRATED ADENOMA (2.3 CM) WITH HIGH GRADE DYSPLASIA. - HIGH GRADE DYSPLASIA INVOLVES APPROXIMATELY 10 TO 15% OF THE POLYP. - CAUTERIZED EDGES ARE FREE OF HIGH GRADE DYSPLASIA BUT ARE INVOLVED WITH ADENOMATOUS CHAN   02/10/2015 Procedure EGD by Dr. Oneida Alar   02/10/2015 Pathology Results Esophagus, biopsy, mass - INVASIVE SQUAMOUS CELL CARCINOMA.   02/25/2015 PET scan Esophageal mass extends longitudinally over about 6 cm and is associated with very hypermetabolic activity. No definite separable adenopathy in the chest or other metastatic disease identified. Coarsened trabeculation with very faintly accentuated...   03/11/2015 Procedure Port placement by IR   03/23/2015 - 04/30/2015 Radiation Therapy Dr. Lisbeth Renshaw, completed on 4/20   03/27/2015 - 05/01/2015 Chemotherapy Carboplaton/Paclitaxel weekly    04/01/2015 Procedure PEG placed by IR   04/22/2015 Miscellaneous CTS consult with Dr. Servando Snare for consideration of surgical intervention    I personally reviewed and went over laboratory results with the patient.  The results are noted within this dictation.    Chart reviewed. He has seen Dr. Servando Snare on 04/22/2015 for cardiothoracic surgery consultation: Assessment / Plan:  Patient  with advanced stage squamous cell carcinoma the mid esophagus,, at least T2 by ultrasound likely under stage because of incompleteness of esophageal ultrasound I discussed with the patient and his daughter what is involved with esophagectomy pros and cons, overall the patient has a very low functional status and it is unclear of his understanding of the underlying disease process. With the current therapy he has significantly improved by both the physician's history and by his history. I suspect that we will not benefit him much by undertaking a significant operation with his limited functional status however before making this decision of plan to see him back in 4-5 weeks with a follow-up CT scan of the chest and abdomen and further discussed with him options.   Tyrike denies any nausea, vomiting, diarrhea, constipation, abdominal pain, shortness of breath, cough, sputum production, difficulty swallowing, etc.  Past Medical History  Diagnosis Date  . Hypertension   . Hyperlipidemia   . Stroke [facility])     left sided weakness  . Squamous cell carcinoma of esophagus (HCC) 02/26/2015    has CVA (cerebral infarction); Tobacco abuse; Alcohol abuse; Hypertension; Encounter for screening colonoscopy; and Squamous cell carcinoma of esophagus ([place]) on his problem list.     has No Known Allergies.  Current Outpatient Prescriptions on File Prior to Visit  Medication Sig Dispense Refill  . acetaminophen (TYLENOL) 500 MG tablet Take 500 mg by mouth every 8 (eight) hours as needed (VIA G-TUBE FOR AS NEEDED FOR PAIN).    Marland Kitchen atorvastatin (LIPITOR) 10 MG tablet Take 10 mg by mouth daily at 6 PM.     . CARBOPLATIN IV Inject into the vein. To be given once a week concurrently with XRT    . cyanocobalamin (,VITAMIN B-12,)  1000 MCG/ML injection Inject 1,000 mcg into the muscle every 30 (thirty) days.    . ENSURE PLUS (ENSURE PLUS) LIQD Take 237 mLs by mouth 2 (two) times daily.    . folic acid (FOLVITE) [id number] MCG  tablet Take 400 mcg by mouth daily.    . hydrALAZINE (APRESOLINE) 10 MG tablet Take 1 tablet (10 mg total) by mouth every 8 (eight) hours. (Patient taking differently: Take 10 mg by mouth 2 (two) times daily. ) 90 tablet 1  . lidocaine-prilocaine (EMLA) cream Apply a quarter size amount to port site 1 hour prior to chemo. Do not rub in. Cover with plastic wrap. 30 g 3  . lisinopril (PRINIVIL,ZESTRIL) 10 MG tablet Take 10 mg by mouth daily.    . metoprolol tartrate (LOPRESSOR) 25 MG tablet Take 12.5 mg by mouth 2 (two) times daily.    . Multiple Vitamin (MULTIVITAMIN WITH MINERALS) TABS tablet Take 1 tablet by mouth daily. 30 tablet 1  . Nutritional Supplements (FEEDING SUPPLEMENT, OSMOLITE 1.2 CAL,) LIQD 1 can 4x daily (1000, 1400, 1800, 2200) Flush w/ 50 mls before and after each can: 400 extra ccs of fluid.  0  . nystatin (MYCOSTATIN) 100000 UNIT/ML suspension 5 mLs every 4 (four) hours as needed.     . ondansetron (ZOFRAN ODT) 8 MG disintegrating tablet Take 1 tablet (8 mg total) by mouth every 8 (eight) hours as needed for nausea or vomiting. (Patient not taking: Reported on 04/17/2015) 30 tablet 2  . oxyCODONE-acetaminophen (PERCOCET/ROXICET) 5-325 MG tablet Reported on 04/22/2015    . PACLitaxel (TAXOL IV) Inject into the vein. Reported on 04/22/2015    . polyethylene glycol (MIRALAX / GLYCOLAX) packet Take 17 g by mouth daily.    . prochlorperazine (COMPAZINE) 10 MG tablet Take 1 tablet (10 mg total) by mouth every 6 (six) hours as needed for nausea or vomiting. 30 tablet 2  . promethazine (PHENERGAN) 25 MG tablet Take 12.5 mg by mouth every 6 (six) hours as needed for nausea or vomiting.     Marland Kitchen PROMETHAZINE HCL IJ Inject 12.5 mg as directed every 6 (six) hours as needed (Nausea and vomitting).    Marland Kitchen senna-docusate (SENOKOT-S) 8.6-50 MG per tablet Take 1 tablet by mouth daily.    . Vitamin D, Ergocalciferol, (DRISDOL) 50000 units CAPS capsule Take 50,000 Units by mouth every 30 (thirty) days.      No current facility-administered medications on file prior to visit.    Past Surgical History  Procedure Laterality Date  . Tonsillectomy    . Cataract extraction w/phaco Left 10/14/2013    Procedure: CATARACT EXTRACTION PHACO AND INTRAOCULAR LENS PLACEMENT LEFT EYE CDE=11.04;  Surgeon: Williams Che, MD;  Location: [facility];  Service: Ophthalmology;  Laterality: Left;  . Cataract extraction w/phaco Right 12/30/2013    Procedure: CATARACT EXTRACTION PHACO AND INTRAOCULAR LENS PLACEMENT; CDE:  110.53;  Surgeon: Williams Che, MD;  Location: [facility];  Service: Ophthalmology;  Laterality: Right;  . Colonoscopy with propofol N/A 02/10/2015    Procedure: COLONOSCOPY WITH PROPOFOL;  Surgeon: Danie Binder, MD;  Location: [facility];  Service: Endoscopy;  Laterality: N/A;  1030  . Esophagogastroduodenoscopy (egd) with propofol N/A 02/10/2015    Procedure: ESOPHAGOGASTRODUODENOSCOPY (EGD) WITH PROPOFOL;  Surgeon: Danie Binder, MD;  Location: [facility];  Service: Endoscopy;  Laterality: N/A;  . Polypectomy N/A 02/10/2015    Procedure: POLYPECTOMY;  Surgeon: Danie Binder, MD;  Location: [facility];  Service: Endoscopy;  Laterality:  N/A;  cecum x 1 and ascending colon polyp x1, Medium sigmoid colon polyp, Medium and Large sigmoid colon polyps  . Biopsy N/A 02/10/2015    Procedure: BIOPSY;  Surgeon: Danie Binder, MD;  Location: [facility];  Service: Endoscopy;  Laterality: N/A;  Esophageal mass biopsies, Gastric biopsies  . Eus N/A 03/05/2015    Procedure: UPPER ENDOSCOPIC ULTRASOUND (EUS) RADIAL;  Surgeon: Milus Banister, MD;  Location: [facility];  Service: Endoscopy;  Laterality: N/A;  . Portacath placement  03/11/2015    Denies any headaches, dizziness, double vision, fevers, chills, night sweats, nausea, vomiting, diarrhea, constipation, chest pain, heart palpitations, shortness of breath, blood in stool, black tarry stool, urinary pain, urinary burning, urinary frequency,  hematuria.   PHYSICAL EXAMINATION  ECOG PERFORMANCE STATUS: 2 - Symptomatic, <50% confined to bed  Filed Vitals:   05/08/15 1048  BP: 120/67  Pulse: 92  Resp: 18     GENERAL:alert, well nourished, well developed, comfortable, cooperative, smiling and in a chemotherapy recliner, port accessed.  Unaccompanied. SKIN: skin color, texture, turgor are normal, no rashes or significant lesions HEAD: Normocephalic, No masses, lesions, tenderness or abnormalities EYES: normal, EOMI, Conjunctiva are pink and non-injected EARS: External ears normal OROPHARYNX:mucous membranes are moist  NECK: trachea midline, supple LYMPH:  not examined BREAST:not examined LUNGS: Clear to auscultation bilaterally without wheezes, rales, or rhonchi. HEART: Regular rate and rhythm without murmur, rub, or gallop. Normal S1 and S2. ABDOMEN: Positive bowel sounds in all 4 quadrants, nontender, soft.  No organomegaly noted. BACK: Back symmetric, no curvature. EXTREMITIES:less then 2 second capillary refill, no skin discoloration, no cyanosis  NEURO: alert & oriented x 3 with fluent speech   LABORATORY DATA: CBC    Component Value Date/Time   WBC 3.3* 05/08/2015 1134   RBC 3.22* 05/08/2015 1134   HGB 10.3* 05/08/2015 1134   HCT 30.2* 05/08/2015 1134   PLT 101* 05/08/2015 1134   MCV 93.8 05/08/2015 1134   MCH 32.0 05/08/2015 1134   MCHC 34.1 05/08/2015 1134   RDW 13.8 05/08/2015 1134   LYMPHSABS 0.3* 05/08/2015 1134   MONOABS 0.9 05/08/2015 1134   EOSABS 0.0 05/08/2015 1134   BASOSABS 0.0 05/08/2015 1134      Chemistry      Component Value Date/Time   NA 134* 05/08/2015 1134   K 4.5 05/08/2015 1134   CL 101 05/08/2015 1134   CO2 24 05/08/2015 1134   BUN 16 05/08/2015 1134   CREATININE 1.08 05/08/2015 1134      Component Value Date/Time   CALCIUM 9.4 05/08/2015 1134   ALKPHOS 63 05/08/2015 1134   AST 17 05/08/2015 1134   ALT 18 05/08/2015 1134   BILITOT 0.2* 05/08/2015 1134         PENDING LABS:   RADIOGRAPHIC STUDIES:  No results found.   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  He is S/P concomitant chemoradiation consisting of weekly Carboplatin/Paclitaxel + XRT 03/23/2015- 05/01/2015.  Oncology history updated.  Even though he has completed therapy 1 week ago, I would like to update labs today: CBC diff, CMET.  His weight is up 2 lbs compared to his last weight check. He notes that he is getting 6 cans of nutrition daily.  He has seen Dr. Servando Snare (CTS) on 04/22/2015 and has a follow-up appointment  on 05/28/2015 following repeat CT imaging for consideration of surgical intervention.    He will return in 3-4 weeks for follow-up.  We will pursue surveillance per NCCN guidelines pending Dr. Everrett Coombe recommendations from a surgical standpoint.    THERAPY PLAN:  He will be evaluate for surgical intervention by Dr. Servando Snare on 05/28/2015.  We will follow surveillance per NCCN guidelines pending his surgical recommendations.  Surveillance for adenocarcinoma of esophagus per NCCN guidelines are as follows (1.2017):  A. H&P: If asymptomatic H&P every 3-6 months for 1-2 years, every 6-12 months for 3-5 years, then annually.  B. Chemistry profile and CBC as clinically indicated.  C. Imaging studies   D. Upper GI endoscopy and biopsy  E. Dilation for anastomotic stenosis  F. Nutritional assessment and counseling   Adenocarcinoma/Squamous cell carcinoma STAGE II or III (T2-T4, N0-N+) treated with bimodal therapy (chemoradiation): Literature suggests that local/regional relapses are common after bimodality therapy (chemoradiation).  Therefore, EGD is a valuable surveillance tool in these patients.  Most  Relapses (95%) occur within 24 months.  Thus  surveillance for at least 24 months is recommended for these patients.  The following recommendations regarding surveillance for T2-T4, N0-N+, T4b are as follows according to NCCN guidelines:  A. Imaging studies are complimentary.    B. EGD every 3-4 months for the first 2 years, every 6 months for the third year, then as clinically warranted.    C. The value of CEA and other tumor markers is unknown.  Adenocarcinoma/Squamous cell carcinoma STAGE II or III (T2-T4, N0-N+) treated with bimodal therapy (chemoradiation): Literature suggests that local/regional relapses are uncommon; therefore EGD surveillance is not recommended after trimodality therapy and most luminal recurrences are detected by other imaging modalities.  The risk and rate of relapse have been correlated with surgical pathology (yp) stage.  For example, yp stage III patients have a much higher rate of relapse (and relapse occuring early during surveillance) rather than patients with yp Stage 0 (relapses are not frequent in these patients).  Literature also suggests that 90% of relapses occur within 36 months of surgery; therefore, surveillance of at least 36 months is recommended.  Specific surveillance recommendations are as follows for those treated with trimodality treatment:  A. Imaging studies (PET/CT of CT chest/abdomen with contrast unless contraindicated) are recommended.  B. Frequency of surveillance may be every 4-6 months in the first 12 months and every 6-9 months in the next 24 months.  C. Unscheduled evaluation is recommended if a patient becomes symptomatic.  D. The value of CEA and other tumor markers are unknown.  E. EGD as a surveillance tool is not recommended.    All questions were answered. The patient knows to call the clinic with any problems, questions or concerns. We can certainly see the patient much sooner if necessary.  Patient and plan discussed with Dr. Ancil Linsey and she is in agreement with the  aforementioned.   This note is electronically signed by: Doy Mince 05/08/2015 5:10 PM

## 2015-05-08 NOTE — Assessment & Plan Note (Addendum)
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  He is S/P concomitant chemoradiation consisting of weekly Carboplatin/Paclitaxel + XRT 03/23/2015- 05/01/2015.  Oncology history updated.  Even though he has completed therapy 1 week ago, I would like to update labs today: CBC diff, CMET.  His weight is up 2 lbs compared to his last weight check. He notes that he is getting 6 cans of nutrition daily.  He has seen Dr. Servando Snare (CTS) on 04/22/2015 and has a follow-up appointment on 05/28/2015 following repeat CT imaging for consideration of surgical intervention.    He will return in 3-4 weeks for follow-up.  We will pursue surveillance per NCCN guidelines pending Dr. Everrett Coombe recommendations from a surgical standpoint.

## 2015-05-08 NOTE — Patient Instructions (Signed)
[place] at [facility] Discharge Instructions  RECOMMENDATIONS MADE BY THE CONSULTANT AND ANY TEST RESULTS WILL BE SENT TO YOUR REFERRING PHYSICIAN.  Exam done and seen today by [name] Labs done today. Will see Dr Servando Snare for CT Scans on 5-18. Return to see the Doctor in 4 weeks for [facility] Call the clinic for any concerns or questions.   Thank you for choosing [place] at [facility] to provide your oncology and hematology care.  To afford each patient quality time with our provider, please arrive at least 15 minutes before your scheduled appointment time.   Beginning January 23rd 2017 lab work for the [facility] will be done in the  Main lab at [facility] on 1st floor. If you have a lab appointment with the [place] please come in thru the  Main Entrance and check in at the main information desk  You need to re-schedule your appointment should you arrive 10 or more minutes late.  We strive to give you quality time with our providers, and arriving late affects you and other patients whose appointments are after yours.  Also, if you no show three or more times for appointments you may be dismissed from the clinic at the providers discretion.     Again, thank you for choosing [facility].  Our hope is that these requests will decrease the amount of time that you wait before being seen by our physicians.       _____________________________________________________________  Should you have questions after your visit to [facility], please contact our office at [phone] between the hours of 8:30 a.m. and 4:30 p.m.  Voicemails left after 4:30 p.m. will not be returned until the following business day.  For prescription refill requests, have your pharmacy contact our office.         Resources For Cancer Patients and their Caregivers ? American Cancer Society: Can assist with transportation, wigs,  general needs, runs Look Good Feel Better.        ([phone] ? Cancer Care: Provides financial assistance, online support groups, medication/co-pay assistance.  1-800-813-HOPE (220)774-9553) ? [place] Assists [place] cancer patients and their families through emotional , educational and financial support.  847-075-2131 ? [place] DSS Where to apply for food stamps, Medicaid and utility assistance. 5810327926 ? RC[employer]: Transportation to medical appointments. 415-102-9617 ? Social Security Administration: May apply for disability if have a Stage IV cancer. 4692093502 [phone] ? [employer], Disability and Transit Services: Assists with nutrition, care and transit needs. [phone]

## 2015-05-16 ENCOUNTER — Encounter (HOSPITAL_COMMUNITY): Payer: Self-pay | Admitting: Hematology & Oncology

## 2015-05-17 ENCOUNTER — Encounter (HOSPITAL_COMMUNITY): Payer: Self-pay | Admitting: Hematology & Oncology

## 2015-05-28 ENCOUNTER — Inpatient Hospital Stay: Admission: RE | Admit: 2015-05-28 | Payer: Medicare Other | Source: Ambulatory Visit

## 2015-05-28 ENCOUNTER — Other Ambulatory Visit: Payer: Medicare Other

## 2015-05-28 ENCOUNTER — Encounter: Payer: Medicare Other | Admitting: Cardiothoracic Surgery

## 2015-06-01 ENCOUNTER — Ambulatory Visit (HOSPITAL_COMMUNITY): Payer: Medicare Other | Admitting: Hematology & Oncology

## 2015-06-01 NOTE — Progress Notes (Signed)
This encounter was created in error - please disregard.

## 2015-06-16 ENCOUNTER — Ambulatory Visit
Admission: RE | Admit: 2015-06-16 | Discharge: 2015-06-16 | Disposition: A | Discharge status: Home / Self Care | Payer: Medicare Other | Source: Ambulatory Visit | Attending: Cardiothoracic Surgery | Admitting: Cardiothoracic Surgery

## 2015-06-16 ENCOUNTER — Encounter: Payer: Medicare Other | Admitting: Cardiothoracic Surgery

## 2015-06-16 DIAGNOSIS — C154 Malignant neoplasm of middle third of esophagus: Secondary | ICD-10-CM

## 2015-06-19 ENCOUNTER — Ambulatory Visit (INDEPENDENT_AMBULATORY_CARE_PROVIDER_SITE_OTHER): Payer: Medicare Other | Admitting: Cardiothoracic Surgery

## 2015-06-19 ENCOUNTER — Encounter: Payer: Self-pay | Admitting: Cardiothoracic Surgery

## 2015-06-19 VITALS — BP 135/76 | HR 82 | Resp 16 | Ht 72.0 in | Wt 196.0 lb

## 2015-06-19 DIAGNOSIS — C153 Malignant neoplasm of upper third of esophagus: Secondary | ICD-10-CM | POA: Diagnosis not present

## 2015-06-19 DIAGNOSIS — Z923 Personal history of irradiation: Secondary | ICD-10-CM

## 2015-06-19 DIAGNOSIS — Z5189 Encounter for other specified aftercare: Secondary | ICD-10-CM | POA: Diagnosis not present

## 2015-06-19 DIAGNOSIS — Z9221 Personal history of antineoplastic chemotherapy: Secondary | ICD-10-CM

## 2015-06-19 NOTE — Progress Notes (Signed)
[address]       [place] [address]             [phone]                    Ahmod E Peter Honalo Medical Record [id number] Date of Birth: 19-Dec-1945  Referring: Patrici Ranks, MD Primary Care: Jani Gravel, MD Oncology: Dr. Whitney Muse Chief Complaint:    Chief Complaint  Patient presents with  . Follow-up    with CT C/A... 06/16/15    History of Present Illness:    Billy Carson 70 y.o. 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 . 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 He is still able to ambulate with the assistance of a walker or a cane. He lives at [facility] facility with his wife. Initially he had increasing difficulty swallowing and required placement of a PEG tube. With the supplemental nutrition and the chemotherapy and radiation that he received he has slowly improved. However his functional status is very limited. He came to the office today by himself. He says he feels better, is taking a po diet without much difficulty.   He will complete his radiation and chemotherapy April 20 and 21st.  Filed Weights   06/19/15 1543  Weight: 196 lb (88.905 kg)   Current Activity/ Functional Status:  Patient is not independent with mobility/ambulation, transfers, ADL's, IADL's.   Zubrod Score: At the time of surgery this patient's most appropriate activity status/level should be described as: []     0    Normal activity, no symptoms []     1    Restricted in physical strenuous activity but ambulatory, able to do out light work [x]     2    Ambulatory and capable of self care, unable to do work activities, up and about               >50 % of waking hours                              []     3    Only limited self care, in bed greater than 50% of waking hours []     4    Completely disabled, no self care, confined to bed or  chair []     5    Moribund   Past Medical History  Diagnosis Date  . Hypertension   . Hyperlipidemia   . Stroke [facility])     left sided weakness  . Squamous cell carcinoma of esophagus ([place]) 02/26/2015    Past Surgical History  Procedure Laterality Date  . Tonsillectomy    . Cataract extraction w/phaco Left 10/14/2013    Procedure: CATARACT EXTRACTION PHACO AND INTRAOCULAR LENS PLACEMENT LEFT EYE CDE=11.04;  Surgeon: Williams Che, MD;  Location: [facility];  Service: Ophthalmology;  Laterality: Left;  . Cataract extraction w/phaco Right 12/30/2013    Procedure: CATARACT EXTRACTION PHACO AND INTRAOCULAR LENS PLACEMENT; CDE:  110.53;  Surgeon: Williams Che, MD;  Location: [facility];  Service: Ophthalmology;  Laterality: Right;  . Colonoscopy with propofol N/A 02/10/2015    Procedure: COLONOSCOPY WITH PROPOFOL;  Surgeon: Danie Binder, MD;  Location: [facility];  Service: Endoscopy;  Laterality: N/A;  1030  .  Esophagogastroduodenoscopy (egd) with propofol N/A 02/10/2015    Procedure: ESOPHAGOGASTRODUODENOSCOPY (EGD) WITH PROPOFOL;  Surgeon: Danie Binder, MD;  Location: [facility];  Service: Endoscopy;  Laterality: N/A;  . Polypectomy N/A 02/10/2015    Procedure: POLYPECTOMY;  Surgeon: Danie Binder, MD;  Location: [facility];  Service: Endoscopy;  Laterality: N/A;  cecum x 1 and ascending colon polyp x1, Medium sigmoid colon polyp, Medium and Large sigmoid colon polyps  . Biopsy N/A 02/10/2015    Procedure: BIOPSY;  Surgeon: Danie Binder, MD;  Location: [facility];  Service: Endoscopy;  Laterality: N/A;  Esophageal mass biopsies, Gastric biopsies  . Eus N/A 03/05/2015    Procedure: UPPER ENDOSCOPIC ULTRASOUND (EUS) RADIAL;  Surgeon: Milus Banister, MD;  Location: [facility];  Service: Endoscopy;  Laterality: N/A;  . Portacath placement  03/11/2015    Family History  Problem Relation Age of Onset  . Colon cancer Neg Hx     Social History   Social History  . Marital  Status: Married    Spouse Name: N/A  . Number of Children: N/A  . Years of Education: N/A   Occupational History  . Not on file.   Social History Main Topics  . Smoking status: Former Smoker -- 1.00 packs/day for 20 years    Types: Cigarettes    Quit date: 10/03/2012  . Smokeless tobacco: Never Used  . Alcohol Use: No     Comment: Denies current ETOH since 12/2013; Previously 1 pint per day per pt 12/24/2013 x 8-10 years.  . Drug Use: No  . Sexual Activity: No   Other Topics Concern  . Not on file   Social History Narrative    History  Smoking status  . Former Smoker -- 1.00 packs/day for 20 years  . Types: Cigarettes  . Quit date: 10/03/2012  Smokeless tobacco  . Never Used    History  Alcohol Use No    Comment: Denies current ETOH since 12/2013; Previously 1 pint per day per pt 12/24/2013 x 8-10 years.     No Known Allergies  Current Outpatient Prescriptions  Medication Sig Dispense Refill  . acetaminophen (TYLENOL) 500 MG tablet Take 500 mg by mouth every 8 (eight) hours as needed (VIA G-TUBE FOR AS NEEDED FOR PAIN).    Marland Kitchen cyanocobalamin (,VITAMIN B-12,) 1000 MCG/ML injection Inject 1,000 mcg into the muscle every 30 (thirty) days.    . folic acid (FOLVITE) Q000111Q MCG tablet Take 400 mcg by mouth daily.    Marland Kitchen guaiFENesin (ROBITUSSIN) 100 MG/5ML liquid Take 200 mg by mouth every 4 (four) hours as needed for cough (VIA  FEEDING TUBE).    . hydrALAZINE (APRESOLINE) 10 MG tablet Take 1 tablet (10 mg total) by mouth every 8 (eight) hours. (Patient taking differently: Take 10 mg by mouth 2 (two) times daily. ) 90 tablet 1  . metoprolol tartrate (LOPRESSOR) 25 MG tablet Take 12.5 mg by mouth 2 (two) times daily.    . Multiple Vitamin (MULTIVITAMIN WITH MINERALS) TABS tablet Take 1 tablet by mouth daily. 30 tablet 1  . Nutritional Supplements (FEEDING SUPPLEMENT, OSMOLITE 1.2 CAL,) LIQD 1 can 4x daily (1000, 1400, 1800, 2200) Flush w/ 50 mls before and after each can: 400 extra  ccs of fluid.  0  . nystatin (MYCOSTATIN) 100000 UNIT/ML suspension 5 mLs every 4 (four) hours as needed.     . ondansetron (ZOFRAN ODT) 8 MG disintegrating tablet Take 1 tablet (8 mg total) by mouth  every 8 (eight) hours as needed for nausea or vomiting. 30 tablet 2  . polyethylene glycol (MIRALAX / GLYCOLAX) packet Take 17 g by mouth daily.    . prochlorperazine (COMPAZINE) 10 MG tablet Take 1 tablet (10 mg total) by mouth every 6 (six) hours as needed for nausea or vomiting. 30 tablet 2  . promethazine (PHENERGAN) 25 MG tablet Take 12.5 mg by mouth every 6 (six) hours as needed for nausea or vomiting.     Marland Kitchen PROMETHAZINE HCL IJ Inject 12.5 mg as directed every 6 (six) hours as needed (Nausea and vomitting).    Marland Kitchen senna-docusate (SENOKOT-S) 8.6-50 MG per tablet Take 1 tablet by mouth daily.    . Vitamin D, Ergocalciferol, (DRISDOL) 50000 units CAPS capsule Take 50,000 Units by mouth every 30 (thirty) days.    Marland Kitchen atorvastatin (LIPITOR) 10 MG tablet Take 10 mg by mouth daily at 6 PM.     . CARBOPLATIN IV Inject into the vein. To be given once a week concurrently with XRT    . ENSURE PLUS (ENSURE PLUS) LIQD Take 237 mLs by mouth 2 (two) times daily.    Marland Kitchen lidocaine-prilocaine (EMLA) cream Apply a quarter size amount to port site 1 hour prior to chemo. Do not rub in. Cover with plastic wrap. (Patient not taking: Reported on 06/19/2015) 30 g 3  . PACLitaxel (TAXOL IV) Inject into the vein. Reported on 06/19/2015     No current facility-administered medications for this visit.      Review of Systems:     Cardiac Review of Systems: Y or N  Chest Pain [ n   ]  Resting SOB [ n  ] Exertional SOB  [n  ]  Orthopnea [ n ]   Pedal Edema [ n  ]    Palpitations [n  ] Syncope  [ n ]   Presyncope [  n ]  General Review of Systems: [Y] = yes [  ]=no Constitional: recent weight change [  ];  Wt loss over the last 3 months [   ] anorexia Blue.Reese  ]; fatigue Blue.Reese  ]; nausea [  ]; night sweats [  ]; fever [  ]; or chills [   ];          Dental: poor dentition[  ]; Last Dentist visit:   Eye : blurred vision [  ]; diplopia [   ]; vision changes [  ];  Amaurosis fugax[  ]; Resp: cough [  ];  wheezing[  ];  hemoptysis[  ]; shortness of breath[  ]; paroxysmal nocturnal dyspnea[  ]; dyspnea on exertion[  ]; or orthopnea[  ];  GI:  gallstones[  ], vomiting[  ];  dysphagia[  ]; melena[  ];  hematochezia [  ]; heartburn[  ];   Hx of  Colonoscopy[  ]; GU: kidney stones [  ]; hematuria[  ];   dysuria [  ];  nocturia[  ];  history of     obstruction [  ]; urinary frequency [  ]             Skin: rash, swelling[  ];, hair loss[  ];  peripheral edema[  ];  or itching[  ]; Musculosketetal: myalgias[  ];  joint swelling[  ];  joint erythema[  ];  joint pain[  ];  back pain[  ];  Heme/Lymph: bruising[  ];  bleeding[  ];  anemia[  ];  Neuro: TIA[  ];  headaches[  ];  stroke[ y ];  vertigo[  ];  seizures[  ];   paresthesias[  ];  difficulty walking[  ];  Psych:depression[  ]; anxiety[  ];  Endocrine: diabetes[  ];  thyroid dysfunction[  ];  Immunizations: Flu up to date [  ]; Pneumococcal up to date [  ];  Other:  Physical Exam: BP 135/76 mmHg  Pulse 82  Resp 16  Ht 6' (1.829 m)  Wt 196 lb (88.905 kg)  BMI 26.58 kg/m2  SpO2 96%  PHYSICAL EXAMINATION: General appearance: alert, cooperative, appears older than stated age, no distress and slowed mentation Head: Normocephalic, without obvious abnormality, atraumatic Neck: no adenopathy, no carotid bruit, no JVD, supple, symmetrical, trachea midline and thyroid not enlarged, symmetric, no tenderness/mass/nodules Lymph nodes: Cervical, supraclavicular, and axillary nodes normal. Resp: clear to auscultation bilaterally Back: symmetric, no curvature. ROM normal. No CVA tenderness. Cardio: regular rate and rhythm, S1, S2 normal, no murmur, click, rub or gallop GI: soft, non-tender; bowel sounds normal; no masses,  no organomegaly and  PEG tube just to the left of the midline abdomen  healed without infection. Extremities: extremities normal, atraumatic, no cyanosis or edema and Palpable pulses at the ankles Neurologic: Patient has significant left arm and left leg weakness, he is able to walk with assistance of a walker but slowly.  Diagnostic Studies & Laboratory data:   Ct Abdomen Wo Contrast Ct Chest Wo Contrast  06/16/2015  CLINICAL DATA:  Follow-up mid esophageal cancer, diagnosed 2017, status post feeding tube placement April 2017 EXAM: CT CHEST AND ABDOMEN WITHOUT CONTRAST TECHNIQUE: Multidetector CT imaging of the chest and abdomen was performed following the standard protocol without intravenous contrast. COMPARISON:  PET-CT dated 02/25/2015 FINDINGS: CT CHEST FINDINGS Mediastinum/Nodes: The heart is top-normal in size. Trace pericardial fluid. No evidence of thoracic aortic aneurysm. No suspicious mediastinal lymphadenopathy. Esophagus is grossly unremarkable. Specifically, no evidence of a mid esophageal mass. Right chest port terminates the cavoatrial junction. Visualized thyroid is unremarkable. Lungs/Pleura: Evaluation of the lung parenchyma is constrained by respiratory motion. Mild dependent atelectasis at the lung bases. No focal consolidation. No suspicious pulmonary nodules. Mild biapical pleural-parenchymal scarring. No pleural effusion or pneumothorax. Musculoskeletal: Degenerative changes of the thoracic spine. CT ABDOMEN FINDINGS Motion degraded images. Hepatobiliary: Unenhanced liver is unremarkable. Status post cholecystectomy. No intrahepatic or extrahepatic ductal dilatation. Pancreas: Within normal limits. Spleen: Within normal limits. Adrenals/Urinary Tract: Adrenal glands are within normal limits. Kidneys are within normal limits. No renal calculi or hydronephrosis. Stomach/Bowel: Stomach is notable for a gastrostomy in satisfactory position. Visualized bowel is unremarkable. Vascular/Lymphatic: No evidence abdominal aortic aneurysm. No suspicious abdominal  lymphadenopathy. Other: No abdominal ascites. 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 Degenerative changes of the lumbar spine. IMPRESSION: Motion degraded images. Prior mid esophageal mass is no longer visualized. No evidence of metastatic disease to the chest or abdomen. Gastrostomy tube in satisfactory position. Please note that the pelvis was not imaged. Electronically Signed   By: Julian Hy M.D.   On: 06/16/2015 13:30     Recent Radiology Findings:   CLINICAL DATA: Initial treatment strategy for squamous cell esophageal carcinoma. Initial diagnosis by endoscopy 02/10/2015  EXAM: NUCLEAR MEDICINE PET SKULL BASE TO THIGH  TECHNIQUE: For 9.9 mCi F-18 FDG was injected intravenously. Full-ring PET imaging was performed from the skull base to thigh after the radiotracer. CT data was obtained and used  for attenuation correction and anatomic localization.  FASTING BLOOD GLUCOSE: Value: 99 mg/dl  COMPARISON: MR brain from 09/06/2012  FINDINGS: NECK  No hypermetabolic lymph nodes in the neck. Mild chronic right maxillary sinusitis.  CHEST  Abnormal mid thoracic esophageal region of hypermetabolism extends over a 6 cm vertical excursion, with maximum standard uptake value of 19.1. There is a small metal clip or fiducial along mass hypermetabolic region. No other hypermetabolic activity in the chest.  Linear subsegmental atelectasis or scarring in the posterior basal segment right lower lobe. Pleural calcification along the left hemidiaphragm, significance uncertain.  ABDOMEN/PELVIS  Faintly high activity in both adrenal glands without visible adrenal mass, right adrenal gland maximum SUV 6.8 and left adrenal gland 7.2.  Scattered physiologic activity in the bowel. Prominent  prostate gland.  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 I am skeptical of osseous metastatic disease given the overall pattern and the very faint increased metabolic activity.  IMPRESSION: 1. Esophageal mass extends longitudinally over about 6 cm and is associated with very hypermetabolic activity. No definite separable adenopathy in the chest or other metastatic disease identified. 2. 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 3. Faintly increased activity in both adrenal glands without adrenal mass, likely physiologic/incidental. 4. Prominent prostate gland. 5. Mild chronic right maxillary sinusitis.   Electronically Signed  By: Van Clines M.D.  On: 02/25/2015 12:51   I have independently reviewed the above radiologic studies.   ESOPHAGUS: MASS LOCATED 25 CM -31 CM FROM THE TEETH. NARROWING LUMEN TO 10-11 CM. TEAR CREATED IN MID ESOPHAGUS. TWO CLIPS PLACED TO CONTROL BLEEDING. 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 SMALL HIATAL HERNIA. MILD ERYTHEMA/EDEMA IN ANTRUM. COLD BIOPSIES OBTAINED. DUODENUM: Mild duodenal inflammation was found in the duodenal bulb. The duodenal mucosa showed no abnormalities in the 2nd part of the duodenum. COMPLICATIONS: There were no immediate complications. ENDOSCOPIC IMPRESSION: 1. ANEMIA DUE TO ESOPHAGEAL MASS, CRI, AND COLON POLYPS 2. MILD GASTRITIS AND DUODENITIS  Endoscopic findings: 1. Clearly malignant, circumferential mass in the esophagus. The proximal edge of this mass is located 26cm from the incisors. I could not advance the large diameter echoendoscope through the malignant  stircture and so could not assess much more than just to proximal aspect of the tumor. EUS findings (incomplete examination, see above): 1. I could only examine the proximal aspect of the cancer due to the fairly snug malignant stricture (lumen 6-43mm across). The observed tumor invades into the muscularis propria but not clearly through that layer (uT2) 2. Incomplete evaluation for local adenopathy (uNX) ENDOSCOPIC IMPRESSION: The maligant, circumferential mass is causing a 91mm lumen stricture. I could not advance the echoendoscope through this stricture and so could only examine the proximal aspect of the tumor. Proximal edge of mass is at 26cm from the incisors and the limited EUS evaluation shows uT2Nx invasion. Given the limitations described above, this may be an underestimate of the true staging. RECOMMENDATIONS: I will communicate these results with Drs. Kefalas, Pennland and Fields. _______________________________ eSigned: Recent Lab Findings: Lab Results  Component Value Date   WBC 3.3* 05/08/2015   HGB 10.3* 05/08/2015   HCT 30.2* 05/08/2015   PLT 101* 05/08/2015   GLUCOSE 130* 05/08/2015   CHOL 172 09/07/2012   TRIG 123  09/07/2012   HDL 57 09/07/2012   LDLCALC 90 09/07/2012   ALT 18 05/08/2015   AST 17 05/08/2015   NA 134* 05/08/2015   K 4.5 05/08/2015   CL 101 05/08/2015   CREATININE 1.08 05/08/2015   BUN 16 05/08/2015   CO2 24 05/08/2015   INR 1.16 04/01/2015   HGBA1C 5.7* 09/06/2012   PATH:[id number] Diagnosis 1. Colon, polyp(s), cecal, ascending - TUBULAR ADENOMA (X1). - NO HIGH GRADE DYSPLASIA OR MALIGNANCY IDENTIFIED. 2. Colon, polyp(s), medium sigmoid - TUBULAR ADENOMA, 1 CM. - NO HIGH GRADE DYSPLASIA OR MALIGNANCY IDENTIFIED. - SEE COMMENT. 3. 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 - HIGH GRADE DYSPLASIA INVOLVES APPROXIMATELY 10 TO 15% OF THE POLYP. - CAUTERIZED EDGES ARE FREE OF HIGH GRADE  DYSPLASIA BUT ARE INVOLVED WITH ADENOMATOUS CHANGE. - ADDITIONAL FRAGMENTS OF TRADITIONAL SERRATED ADENOMA (1.9 CM IN AGGREGATE) WITH HIGH GRADE DYSPLASIA INVOLVING APPROXIMATELY 20% OF ADDITIONAL ADENOMATOUS FRAGMENTS. 4. Stomach, biopsy - BENIGN GASTRIC MUCOSA WITH CHRONIC INACTIVE GASTRITIS. - WARTHIN-STARRY STAIN IS NEGATIVE FOR HELICOBACTER PYLORI. - NO INTESTINAL METAPLASIA, DYSPLASIA, OR MALIGNANCY. 5. Esophagus, biopsy, mass - INVASIVE SQUAMOUS CELL CARCINOMA.   Assessment / Plan:   Patient with advanced stage squamous cell carcinoma the mid esophagus,, at least T2 by ultrasound likely under stage because of incompleteness of esophageal ultrasound I  Previously discussed with the patient and his daughter what is involved with esophagectomy pros and cons, overall the patient has a very low functional status.  With the current therapy he has significantly improved . With follow up ct shows no tumour. I do not think he would  benefit y undertaking a significant operation with his limited functional status. Patient today expressed desire not to have operation.     I  spent 20 minutes counseling the patient face to face and 50% or more the  time was spent in counseling and coordination of care. The total time spent in the appointment was 30 minutes.  Grace Isaac MD      [address] [place] [address] Office [phone]   Beeper [phone]  06/21/2015 9:54 PM

## 2015-06-26 ENCOUNTER — Encounter (HOSPITAL_COMMUNITY): Payer: Medicare Other | Attending: Hematology & Oncology

## 2015-06-26 ENCOUNTER — Other Ambulatory Visit (HOSPITAL_COMMUNITY): Payer: Self-pay | Admitting: Hematology & Oncology

## 2015-06-26 ENCOUNTER — Encounter (HOSPITAL_COMMUNITY): Payer: Self-pay

## 2015-06-26 DIAGNOSIS — Z452 Encounter for adjustment and management of vascular access device: Secondary | ICD-10-CM | POA: Diagnosis not present

## 2015-06-26 DIAGNOSIS — R410 Disorientation, unspecified: Secondary | ICD-10-CM

## 2015-06-26 DIAGNOSIS — C159 Malignant neoplasm of esophagus, unspecified: Secondary | ICD-10-CM | POA: Insufficient documentation

## 2015-06-26 DIAGNOSIS — I639 Cerebral infarction, unspecified: Secondary | ICD-10-CM

## 2015-06-26 MED ORDER — SODIUM CHLORIDE 0.9% FLUSH
10.0000 mL | INTRAVENOUS | Status: DC | PRN
Start: 2015-06-26 — End: 2015-06-26
  Administered 2015-06-26: 10 mL via INTRAVENOUS
  Filled 2015-06-26: qty 10

## 2015-06-26 MED ORDER — ACETAMINOPHEN 500 MG PO TABS
ORAL_TABLET | ORAL | Status: AC
  Filled 2015-06-26: qty 1

## 2015-06-26 MED ORDER — HEPARIN SOD (PORK) LOCK FLUSH 100 UNIT/ML IV SOLN
500.0000 [IU] | Freq: Once | INTRAVENOUS | Status: AC
  Administered 2015-06-26: 500 [IU] via INTRAVENOUS
  Filled 2015-06-26: qty 5

## 2015-06-26 NOTE — Progress Notes (Signed)
Billy Carson presented for Portacath access and flush. Portacath located right chest wall accessed with  H 20 needle. Good blood return present. Portacath flushed with 35ml NS and 500U/81ml Heparin and needle removed intact. Procedure without incident. Patient tolerated procedure well.

## 2015-06-26 NOTE — Patient Instructions (Signed)
[place] at [facility] Discharge Instructions  RECOMMENDATIONS MADE BY THE CONSULTANT AND ANY TEST RESULTS WILL BE SENT TO YOUR REFERRING PHYSICIAN.  Port flush done today per orders. Follow up as scheduled  Thank you for choosing [place] at [facility] to provide your oncology and hematology care.  To afford each patient quality time with our provider, please arrive at least 15 minutes before your scheduled appointment time.   Beginning January 23rd 2017 lab work for the [facility] will be done in the  Main lab at [facility] on 1st floor. If you have a lab appointment with the [place] please come in thru the  Main Entrance and check in at the main information desk  You need to re-schedule your appointment should you arrive 10 or more minutes late.  We strive to give you quality time with our providers, and arriving late affects you and other patients whose appointments are after yours.  Also, if you no show three or more times for appointments you may be dismissed from the clinic at the providers discretion.     Again, thank you for choosing [facility].  Our hope is that these requests will decrease the amount of time that you wait before being seen by our physicians.       _____________________________________________________________  Should you have questions after your visit to [facility], please contact our office at [phone] between the hours of 8:30 a.m. and 4:30 p.m.  Voicemails left after 4:30 p.m. will not be returned until the following business day.  For prescription refill requests, have your pharmacy contact our office.         Resources For Cancer Patients and their Caregivers ? American Cancer Society: Can assist with transportation, wigs, general needs, runs Look Good Feel Better.        580-727-2963 ? Cancer Care: Provides financial assistance, online support groups,  medication/co-pay assistance.  1-800-813-HOPE 769-675-1786) ? [facility] Assists [place] cancer patients and their families through emotional , educational and financial support.  541-677-0919 ? [place] DSS Where to apply for food stamps, Medicaid and utility assistance. 763-636-4808 ? [facility]: Transportation to medical appointments. (806)356-1036 ? Social Security Administration: May apply for disability if have a Stage IV cancer. 667-215-3030 707-032-0717 ? [employer], Disability and Transit Services: Assists with nutrition, care and transit needs. [place] Support Programs: @10RELATIVEDAYS @ > Cancer Support Group  2nd Tuesday of the month 1pm-2pm, Journey Room  > Creative Journey  3rd Tuesday of the month 1130am-1pm, Journey Room  > Look Good Feel Better  1st Wednesday of the month 10am-12 noon, Journey Room (Call [place] to register [phone])

## 2015-07-02 ENCOUNTER — Ambulatory Visit (HOSPITAL_COMMUNITY)
Admission: RE | Admit: 2015-07-02 | Discharge: 2015-07-02 | Disposition: A | Discharge status: Home / Self Care | Payer: Medicare Other | Source: Ambulatory Visit | Attending: Hematology & Oncology | Admitting: Hematology & Oncology

## 2015-07-02 DIAGNOSIS — C159 Malignant neoplasm of esophagus, unspecified: Secondary | ICD-10-CM | POA: Diagnosis present

## 2015-07-02 DIAGNOSIS — I639 Cerebral infarction, unspecified: Secondary | ICD-10-CM | POA: Diagnosis present

## 2015-07-02 DIAGNOSIS — R410 Disorientation, unspecified: Secondary | ICD-10-CM | POA: Diagnosis present

## 2015-07-02 LAB — POCT I-STAT CREATININE: Creatinine, Ser: 1.2 mg/dL (ref 0.61–1.24)

## 2015-07-02 MED ORDER — IOPAMIDOL (ISOVUE-370) INJECTION 76%
75.0000 mL | Freq: Once | INTRAVENOUS | Status: AC | PRN
  Administered 2015-07-02: 75 mL via INTRAVENOUS

## 2015-07-27 ENCOUNTER — Other Ambulatory Visit: Payer: Self-pay

## 2015-07-27 ENCOUNTER — Telehealth: Payer: Self-pay

## 2015-07-27 ENCOUNTER — Telehealth: Payer: Self-pay | Admitting: Gastroenterology

## 2015-07-27 ENCOUNTER — Encounter (HOSPITAL_COMMUNITY): Payer: Self-pay | Admitting: Hematology & Oncology

## 2015-07-27 ENCOUNTER — Encounter (HOSPITAL_COMMUNITY): Payer: Medicare Other | Attending: Hematology & Oncology | Admitting: Hematology & Oncology

## 2015-07-27 VITALS — BP 151/80 | HR 112 | Temp 99.2°F | Resp 18 | Wt 206.8 lb

## 2015-07-27 DIAGNOSIS — I639 Cerebral infarction, unspecified: Secondary | ICD-10-CM

## 2015-07-27 DIAGNOSIS — Z8673 Personal history of transient ischemic attack (TIA), and cerebral infarction without residual deficits: Secondary | ICD-10-CM

## 2015-07-27 DIAGNOSIS — C159 Malignant neoplasm of esophagus, unspecified: Secondary | ICD-10-CM

## 2015-07-27 DIAGNOSIS — N183 Chronic kidney disease, stage 3 (moderate): Secondary | ICD-10-CM

## 2015-07-27 DIAGNOSIS — Z593 Problems related to living in residential institution: Secondary | ICD-10-CM

## 2015-07-27 NOTE — Patient Instructions (Signed)
[place] at [facility] Discharge Instructions  RECOMMENDATIONS MADE BY THE CONSULTANT AND ANY TEST RESULTS WILL BE SENT TO YOUR REFERRING PHYSICIAN.  You saw Dr. Whitney Muse today.  She wants you to see Dr. Oneida Alar ASAP for EGD. Return to clinic after EGD has been done. After EGD, decision will be made about removing PEG tube.  Thank you for choosing [place] at [facility] to provide your oncology and hematology care.  To afford each patient quality time with our provider, please arrive at least 15 minutes before your scheduled appointment time.   Beginning January 23rd 2017 lab work for the [facility] will be done in the  Main lab at [facility] on 1st floor. If you have a lab appointment with the [facility] please come in thru the  Main Entrance and check in at the main information desk  You need to re-schedule your appointment should you arrive 10 or more minutes late.  We strive to give you quality time with our providers, and arriving late affects you and other patients whose appointments are after yours.  Also, if you no show three or more times for appointments you may be dismissed from the clinic at the providers discretion.     Again, thank you for choosing [facility].  Our hope is that these requests will decrease the amount of time that you wait before being seen by our physicians.       _____________________________________________________________  Should you have questions after your visit to [facility], please contact our office at [phone] between the hours of 8:30 a.m. and 4:30 p.m.  Voicemails left after 4:30 p.m. will not be returned until the following business day.  For prescription refill requests, have your pharmacy contact our office.         Resources For Cancer Patients and their Caregivers ? American Cancer Society: Can assist with transportation, wigs, general needs, runs  Look Good Feel Better.        320 195 0942 ? Cancer Care: Provides financial assistance, online support groups, medication/co-pay assistance.  1-800-813-HOPE 740-640-8991) ? [place] Assists [place] cancer patients and their families through emotional , educational and financial support.  (507) 430-5445 ? [place] DSS Where to apply for food stamps, Medicaid and utility assistance. 412-412-5063 ? [employer]: Transportation to medical appointments. 867-868-0581 ? Social Security Administration: May apply for disability if have a Stage IV cancer. 816-864-0791 385-751-1269 ? [employer], Disability and Transit Services: Assists with nutrition, care and transit needs. [place] Support Programs: @10RELATIVEDAYS @ > Cancer Support Group  2nd Tuesday of the month 1pm-2pm, Journey Room  > Creative Journey  3rd Tuesday of the month 1130am-1pm, Journey Room  > Look Good Feel Better  1st Wednesday of the month 10am-12 noon, Journey Room (Call [place] to register [phone])

## 2015-07-27 NOTE — Telephone Encounter (Signed)
Orders and instructions are done

## 2015-07-27 NOTE — Telephone Encounter (Signed)
Instructions have faxed and they are aware of date and time

## 2015-07-27 NOTE — Telephone Encounter (Signed)
PT NEED EGD W/ MAC NEXT TUES, DX: SQ CELL CA OF ESOPHAGUS.

## 2015-07-27 NOTE — Progress Notes (Signed)
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 Oneida Alar   02/10/2015 Pathology Results Colon, polyp(s), sigmoid - TRADITIONAL SERRATED ADENOMA (2.3 CM) WITH HIGH GRADE DYSPLASIA. - HIGH GRADE DYSPLASIA INVOLVES APPROXIMATELY 10 TO 15% OF THE POLYP. - CAUTERIZED EDGES ARE FREE OF HIGH GRADE DYSPLASIA BUT ARE INVOLVED WITH ADENOMATOUS CHAN   02/10/2015 Procedure EGD by Dr. Oneida Alar   02/10/2015 Pathology Results Esophagus, biopsy, mass - INVASIVE SQUAMOUS CELL CARCINOMA.   02/25/2015 PET scan Esophageal mass extends longitudinally over about 6 cm and is associated with very hypermetabolic activity. No definite separable adenopathy in the chest or other metastatic disease identified. Coarsened trabeculation with very faintly accentuated...   03/11/2015 Procedure Port placement by IR   03/23/2015 - 04/30/2015 Radiation Therapy Dr. Lisbeth Renshaw, completed on 4/20   03/27/2015 - 05/01/2015 Chemotherapy Carboplaton/Paclitaxel weekly    04/01/2015 Procedure PEG placed by IR    HISTORY OF PRESENTING ILLNESS:  Billy Carson 70 y.o. male is here for follow up of squamous cell carcinoma of the esophagus.  He also has a sigmoid polyp with high grade dysplasia (Dr. Oneida Alar believes she removed it all). 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 He is still able to ambulate with the assistance of a walker or a cane. He lives at [facility] assisted living facility with his wife.   Billy Carson is unaccompanied and presents in wheelchair. He saw Dr. Servando Snare, has opted not to pursue therapy.   He was still receiving TF twice daily, morning and at night. The last couple days he has not used it at all. He is able to eat without  difficulty swallowing. States, food tastes good. Weight today is 206.8 pounds.   He denies any pain. He is still using a walker. He is doing well. He has been staying inside with the recent hot weather.  He believes his port was flushed the last time he came to the [facility]. He denies any other complaints.   MEDICAL HISTORY:  Past Medical History  Diagnosis Date  . Hypertension   . Hyperlipidemia   . Stroke [facility])     left sided weakness  . 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 Tonsillectomy    . Cataract extraction w/phaco Left 10/14/2013    Procedure: CATARACT EXTRACTION PHACO AND INTRAOCULAR LENS PLACEMENT LEFT EYE CDE=11.04;  Surgeon: Williams Che, MD;  Location: [facility] ORS;  Service: Ophthalmology;  Laterality: Left;  . Cataract extraction w/phaco Right 12/30/2013    Procedure: CATARACT EXTRACTION PHACO AND INTRAOCULAR LENS PLACEMENT; CDE:  110.53;  Surgeon: Williams Che, MD;  Location: [facility] ORS;  Service: Ophthalmology;  Laterality: Right;  . Colonoscopy with propofol N/A 02/10/2015    Procedure: COLONOSCOPY WITH PROPOFOL;  Surgeon: Danie Binder, MD;  Location: [facility] ENDO SU[facility];  Service: Endoscopy;  Laterality: N/A;  1030  . Esophagogastroduodenoscopy (egd) with propofol N/A 02/10/2015    Procedure: ESOPHAGOGASTRODUODENOSCOPY (EGD) WITH PROPOFOL;  Surgeon: Danie Binder, MD;  Location: [facility];  Service: Endoscopy;  Laterality: N/A;  . Polypectomy N/A 02/10/2015    Procedure: POLYPECTOMY;  Surgeon: Danie Binder, MD;  Location:  [facility];  Service: Endoscopy;  Laterality: N/A;  cecum x 1 and ascending colon polyp x1, Medium sigmoid colon polyp, Medium and Large sigmoid colon polyps  . Biopsy N/A 02/10/2015    Procedure: BIOPSY;  Surgeon: Danie Binder, MD;  Location: [facility] SU[facility];  Service: Endoscopy;  Laterality: N/A;  Esophageal mass biopsies, Gastric biopsies  . Eus N/A  03/05/2015    Procedure: UPPER ENDOSCOPIC ULTRASOUND (EUS) RADIAL;  Surgeon: Milus Banister, MD;  Location: [facility] ENDOS[facility];  Service: Endoscopy;  Laterality: N/A;  . Portacath placement  03/11/2015    SOCIAL HISTORY: Social History   Social History  . Marital Status: Married    Spouse Name: N/A  . Number of Children: N/A  . Years of Education: N/A   Occupational History  . Not on file.   Social History Main Topics  . Smoking status: Former Smoker -- 1.00 packs/day for 20 years    Types: Cigarettes    Quit date: 10/03/2012  . Smokeless tobacco: Never Used  . Alcohol Use: No     Comment: Denies current ETOH since 12/2013; Previously 1 pint per day per pt 12/24/2013 x 8-10 years.  . Drug Use: No  . Sexual Activity: No   Other Topics Concern  . 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 Colon cancer Neg Hx    has no family status information on file.   Father deceased at 84 yo of a stroke Mother deceased, not sure of the cause 4 brothers and 5 sisters Only 1 sister still living. 1 brother died of throat cancer  ALLERGIES:  has No Known Allergies.  MEDICATIONS:  Current Outpatient Prescriptions  Medication Sig Dispense Refill  . acetaminophen (TYLENOL) 500 MG tablet Take 500 mg by mouth every 8 (eight) hours as needed (VIA G-TUBE FOR AS NEEDED FOR PAIN).    Marland Kitchen atorvastatin (LIPITOR) 10 MG tablet Take 10 mg by mouth daily at 6 PM.     . CARBOPLATIN IV Inject into the vein. To be given once a week concurrently with XRT    . cyanocobalamin (,VITAMIN B-12,) 1000 MCG/ML injection Inject 1,000 mcg into the muscle every 30 (thirty) days.    . ENSURE PLUS (ENSURE PLUS) LIQD Take 237 mLs by mouth 2 (two) times daily.    . folic acid (FOLVITE) [id number] MCG tablet Take 400 mcg by mouth daily.    Marland Kitchen guaiFENesin  (ROBITUSSIN) 100 MG/5ML liquid Take 200 mg by mouth every 4 (four) hours as needed for cough (VIA  FEEDING TUBE).    . hydrALAZINE (APRESOLINE) 10 MG tablet Take 1 tablet (10 mg total) by mouth every 8 (eight) hours. (Patient taking differently: Take 10 mg by mouth 2 (two) times daily. ) 90 tablet 1  . metoprolol tartrate (LOPRESSOR) 25 MG tablet Take 12.5 mg by mouth 2 (two) times daily.    . Multiple Vitamin (MULTIVITAMIN WITH MINERALS) TABS tablet Take 1 tablet by mouth daily. 30 tablet 1  . Nutritional Supplements (FEEDING SUPPLEMENT, OSMOLITE 1.2 CAL,) LIQD 1 can 4x daily (1000, 1400, 1800, 2200) Flush w/ 50 mls before and after each can: 400 extra ccs of fluid.  0  . nystatin (MYCOSTATIN) 100000 UNIT/ML suspension 5 mLs every 4 (four) hours as needed.     . ondansetron (ZOFRAN ODT)  8 MG disintegrating tablet Take 1 tablet (8 mg total) by mouth every 8 (eight) hours as needed for nausea or vomiting. 30 tablet 2  . PACLitaxel (TAXOL IV) Inject into the vein. Reported on 06/19/2015    . polyethylene glycol (MIRALAX / GLYCOLAX) packet Take 17 g by mouth daily.    . prochlorperazine (COMPAZINE) 10 MG tablet Take 1 tablet (10 mg total) by mouth every 6 (six) hours as needed for nausea or vomiting. 30 tablet 2  . promethazine (PHENERGAN) 25 MG tablet Take 12.5 mg by mouth every 6 (six) hours as needed for nausea or vomiting.     Marland Kitchen PROMETHAZINE HCL IJ Inject 12.5 mg as directed every 6 (six) hours as needed (Nausea and vomitting).    Marland Kitchen senna-docusate (SENOKOT-S) 8.6-50 MG per tablet Take 1 tablet by mouth daily.    . Vitamin D, Ergocalciferol, (DRISDOL) 50000 units CAPS capsule Take 50,000 Units by mouth every 30 (thirty) days.    Marland Kitchen lidocaine-prilocaine (EMLA) cream Apply a quarter size amount to port site 1 hour prior to chemo. Do not rub in. Cover with plastic wrap. (Patient not taking: Reported on 06/19/2015) 30 g 3  . lisinopril (PRINIVIL,ZESTRIL) 10 MG tablet Reported on 07/27/2015     No current  facility-administered medications for this visit.    Review of Systems  Constitutional: Negative. HENT: Negative.   Eyes: Negative.   Respiratory: Negative.   Cardiovascular: Negative.   Gastrointestinal: Negative Genitourinary: Negative.   Musculoskeletal: Negative.   Skin: Negative.   Neurological: Positive for sensory change, speech change, focal weakness and weakness.       Left sided weakness associated with prior stroke.  Endo/Heme/Allergies: Negative.   Psychiatric/Behavioral: Negative.   All other systems reviewed and are negative.  14 point ROS was done and is otherwise as detailed above or in HPI   PHYSICAL EXAMINATION: ECOG PERFORMANCE STATUS: 2 - Symptomatic, <50% confined to bed   Filed Vitals:   07/27/15 1309  BP: 151/80  Pulse: 112  Temp: 99.2 F (37.3 C)  Resp: 18   Filed Weights   07/27/15 1309  Weight: 206 lb 12.8 oz (93.804 kg)    Physical Exam  Constitutional: He is oriented to person, place, and time and well-developed, well-nourished, and in no distress.  In wheelchair. Wears glasses. HENT:  Head: Normocephalic and atraumatic.  Mouth/Throat: Oropharynx is clear and moist.  Eyes: Conjunctivae and EOM are normal. Pupils are equal, round, and reactive to light. Right eye exhibits no discharge. Left eye exhibits no discharge.  Right pupil deformity noted.  Neck: Normal range of motion. Neck supple. No JVD present.  Cardiovascular: Normal rate and regular rhythm.   Pulmonary/Chest: Effort normal and breath sounds normal.  Abdominal: Soft. Bowel sounds are normal. He exhibits no distension and no mass. There is no tenderness. There is no rebound and no guarding. FT C/D/I Musculoskeletal: Normal range of motion. He exhibits no edema or tenderness.  Lymphadenopathy:    He has no cervical adenopathy.  Neurological: He is alert and oriented to person, place, and time. No cranial nerve deficit. He exhibits abnormal muscle tone. Coordination abnormal.    Left sided weakness in both upper and lower extremities. Limited grip strength in the left hand.  Skin: Skin is warm and dry.  Psychiatric: Memory normal.  Nursing note and vitals reviewed.   LABORATORY DATA:  I have reviewed the data as listed Lab Results  Component Value Date   WBC 3.3* 05/08/2015   HGB  10.3* 05/08/2015   HCT 30.2* 05/08/2015   MCV 93.8 05/08/2015   PLT 101* 05/08/2015   CMP     Component Value Date/Time   NA 134* 05/08/2015 1134   K 4.5 05/08/2015 1134   CL 101 05/08/2015 1134   CO2 24 05/08/2015 1134   GLUCOSE 130* 05/08/2015 1134   BUN 16 05/08/2015 1134   CREATININE 1.20 07/02/2015 1412   CALCIUM 9.4 05/08/2015 1134   PROT 7.1 05/08/2015 1134   ALBUMIN 3.6 05/08/2015 1134   AST 17 05/08/2015 1134   ALT 18 05/08/2015 1134   ALKPHOS 63 05/08/2015 1134   BILITOT 0.2* 05/08/2015 1134   GFRNONAA >60 05/08/2015 1134   GFRAA >60 05/08/2015 1134    PATHOLOGY:   RADIOGRAPHIC STUDIES: I have personally reviewed the radiological images as listed and agreed with the findings in the report. CLINICAL DATA: Follow-up mid esophageal cancer, diagnosed 2017, status post feeding tube placement April 2017  EXAM: CT CHEST AND ABDOMEN WITHOUT CONTRAST  TECHNIQUE: Multidetector CT imaging of the chest and abdomen was performed following the standard protocol without intravenous contrast.  COMPARISON: PET-CT dated 02/25/2015  FINDINGS: CT CHEST FINDINGS  Mediastinum/Nodes: The heart is top-normal in size. Trace pericardial fluid.  No evidence of thoracic aortic aneurysm.  No suspicious mediastinal lymphadenopathy.  Esophagus is grossly unremarkable. Specifically, no evidence of a mid esophageal mass.  Right chest port terminates the cavoatrial junction.  Visualized thyroid is unremarkable.  Lungs/Pleura: Evaluation of the lung parenchyma is constrained by respiratory motion.  Mild dependent atelectasis at the lung bases. No  focal consolidation.  No suspicious pulmonary nodules. Mild biapical pleural-parenchymal scarring.  No pleural effusion or pneumothorax.  Musculoskeletal: Degenerative changes of the thoracic spine.  CT ABDOMEN FINDINGS  Motion degraded images.  Hepatobiliary: Unenhanced liver is unremarkable.  Status post cholecystectomy. No intrahepatic or extrahepatic ductal dilatation.  Pancreas: Within normal limits.  Spleen: Within normal limits.  Adrenals/Urinary Tract: Adrenal glands are within normal limits.  Kidneys are within normal limits. No renal calculi or hydronephrosis.  Stomach/Bowel: Stomach is notable for a gastrostomy in satisfactory position.  Visualized bowel is unremarkable.  Vascular/Lymphatic: No evidence abdominal aortic aneurysm.  No suspicious abdominal lymphadenopathy.  Other: No abdominal ascites.  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  Degenerative changes of the lumbar spine.  IMPRESSION: Motion degraded images.  Prior mid esophageal mass is no longer visualized.  No evidence of metastatic disease to the chest or abdomen.  Gastrostomy tube in satisfactory position.  Please note that the pelvis was not imaged.   Electronically Signed  By: Julian Hy M.D.  On: 06/16/2015 13:30   ASSESSMENT & PLAN:  EGD 02/10/2015 with esophageal mass, biopsy c/w squamous cell carcinoma Esophageal mass extends from 25cm to 31 cm from the teeth, narrowing lumen to 10-11 cm in mid esophagus Two clips placed to control bleeding Colonoscopy on 02/10/2015 with 5 colon polyps, large internal hemorrhoids Pathology with sigmoid polyp, serrated adenoma 2.3 cm with high grade dysplasia History of stroke with left-sided weakness Stage III chronic kidney disease NH  resident at [facility] Concurrent chemo/xrt Tube Feeding secondary to progressive weight loss    The patient saw Dr. Servando Snare on 06/19/2015. No surgery will be pursued given comorbidities. CT imaging was reviewed.  I will refer the patient for a repeat EGD with Dr. Oneida Alar.   He will return after his  EGD to discuss the results. We will discuss FT removal at a future date. His weight is excellent. Will try to get records from [facility] regarding how often they are using it and why. Will address at F/U. Will also discuss with his daughter.   Literature suggests that local/regional relapses are common after bimodality therapy (chemoradiation).  Therefore, EGD is a valuable surveillance tool in these patients.  Most  Relapses (95%) occur within 24 months.  Thus surveillance for at least 24 months is recommended for these patients.  The following recommendations regarding surveillance for T2-T4, N0-N+, T4b are as follows according to NCCN guidelines: Imaging studies are complimentary.  EGD every 3-4 months for the first 2 years, every 6 months for the third year, then as clinically warranted.  The value of CEA and other tumor markers is unknown.  All questions were answered. The patient knows to call the clinic with any problems, questions or concerns.  This document serves as a record of services personally performed by Ancil Linsey, MD. It was created on her behalf by Arlyce Harman, a trained medical scribe. The creation of this record is based on the scribe's personal observations and the provider's statements to them. This document has been checked and approved by the attending provider.  I have reviewed the above documentation for accuracy and completeness, and I agree with the above.  This note was electronically signed.    Billy Hazard, MD  07/28/2015 5:15 PM

## 2015-07-27 NOTE — Telephone Encounter (Signed)
T/C from [facility] @ [facility] said that pt saw Dr. Whitney Muse and she is requesting that pt see Dr. Oneida Alar for EGD and remove feeding tube. Per Ginger, she has received a message from Dr. Oneida Alar to schedule the EGD. I will get in touch with the nurse at [facility] for the med list and triage info.

## 2015-07-27 NOTE — Telephone Encounter (Signed)
I have called [facility] and spoke with his nurse for today, an agency nurse, Redmond Baseman. Got some of the triage info from her and she is faxing the allergy and med list to me.

## 2015-07-28 ENCOUNTER — Encounter (HOSPITAL_COMMUNITY): Payer: Self-pay | Admitting: Hematology & Oncology

## 2015-07-29 NOTE — Telephone Encounter (Signed)
Received a more complete med list, but neither had Lisinopril or Metoprolol listed.  I called and LMOM for a return call about those two meds, so I can complete his med list reconciliation.

## 2015-07-29 NOTE — Telephone Encounter (Signed)
I received med list but it does not have a lot of the meds listed, I called and spoke to Datto and asked her to have the complete med list faxed to me.

## 2015-07-29 NOTE — Telephone Encounter (Signed)
Forwarding the med list to Dr. Oneida Alar for review prior to his EGD next week.

## 2015-07-30 NOTE — Telephone Encounter (Signed)
MED LIST REVIEWED.

## 2015-07-30 NOTE — Patient Instructions (Signed)
Billy Carson  07/30/2015     @PREFPERIOPPHARMACY @   Your procedure is scheduled on  08/04/2015   Report to [facility] at  745  A.M.  Call this number if you have problems the morning of surgery:  [phone]   Remember:  Do not eat food or drink liquids after midnight.  Take these medicines the morning of surgery with A SIP OF WATER  Lisinopril, lopresor, zofran or phenergan.   Do not wear jewelry, make-up or nail polish.  Do not wear lotions, powders, or perfumes.  You may wear deoderant.  Do not shave 48 hours prior to surgery.  Men may shave face and neck.  Do not bring valuables to the hospital.  [facility] is not responsible for any belongings or valuables.  Contacts, dentures or bridgework may not be worn into surgery.  Leave your suitcase in the car.  After surgery it may be brought to your room.  For patients admitted to the hospital, discharge time will be determined by your treatment team.  Patients discharged the day of surgery will not be allowed to drive home.   Name and phone number of your driver:   family Special instructions:  Follow the diet instructions given to you by Dr Nona Dell office.  Please read over the following fact sheets that you were given. Coughing and Deep Breathing, Surgical Site Infection Prevention, Anesthesia Post-op Instructions and Care and Recovery After Surgery      Esophagogastroduodenoscopy Esophagogastroduodenoscopy (EGD) is a procedure that is used to examine the lining of the esophagus, stomach, and first part of the small intestine (duodenum). A long, flexible, lighted tube with a camera attached (endoscope) is inserted down the throat to view these organs. This procedure is done to detect problems or abnormalities, such as inflammation, bleeding, ulcers, or growths, in order to treat them. The procedure lasts 5-20 minutes. It is usually an outpatient procedure, but it may need to be performed in a hospital  in emergency cases. LET [facility] CARE PROVIDER KNOW ABOUT:  Any allergies you have.  All medicines you are taking, including vitamins, herbs, eye drops, creams, and over-the-counter medicines.  Previous problems you or members of your family have had with the use of anesthetics.  Any blood disorders you have.  Previous surgeries you have had.  Medical conditions you have. RISKS AND COMPLICATIONS Generally, this is a safe procedure. However, problems can occur and include:  Infection.  Bleeding.  Tearing (perforation) of the esophagus, stomach, or duodenum.  Difficulty breathing or not being able to breathe.  Excessive sweating.  Spasms of the larynx.  Slowed heartbeat.  Low blood pressure. BEFORE THE PROCEDURE  Do not eat or drink anything after midnight on the night before the procedure or as directed by your health care provider.  Do not take your regular medicines before the procedure if your health care provider asks you not to. Ask your health care provider about changing or stopping those medicines.  If you wear dentures, be prepared to remove them before the procedure.  Arrange for someone to drive you home after the procedure. PROCEDURE  A numbing medicine (local anesthetic) may be sprayed in your throat for comfort and to stop you from gagging or coughing.  You will have an IV tube inserted in a vein in your hand or arm. You will receive medicines and fluids through this tube.  You will be given a medicine to  relax you (sedative).  A pain reliever will be given through the IV tube.  A mouth guard may be placed in your mouth to protect your teeth and to keep you from biting on the endoscope.  You will be asked to lie on your left side.  The endoscope will be inserted down your throat and into your esophagus, stomach, and duodenum.  Air will be put through the endoscope to allow your health care provider to clearly view the lining of your  esophagus.  The lining of your esophagus, stomach, and duodenum will be examined. During the exam, your health care provider may:  Remove tissue to be examined under a microscope (biopsy) for inflammation, infection, or other medical problems.  Remove growths.  Remove objects (foreign bodies) that are stuck.  Treat any bleeding with medicines or other devices that stop tissues from bleeding (hot cautery, clipping devices).  Widen (dilate) or stretch narrowed areas of your esophagus and stomach.  The endoscope will be withdrawn. AFTER THE PROCEDURE  You will be taken to a recovery area for observation. Your blood pressure, heart rate, breathing rate, and blood oxygen level will be monitored often until the medicines you were given have worn off.  Do not eat or drink anything until the numbing medicine has worn off and your gag reflex has returned. You may choke.  Your health care provider should be able to discuss his or her findings with you. It will take longer to discuss the test results if any biopsies were taken.   This information is not intended to replace advice given to you by your health care provider. Make sure you discuss any questions you have with your health care provider.   Document Released: 04/29/2004 Document Revised: 01/17/2014 Document Reviewed: 11/30/2011 Elsevier Interactive Patient Education 2016 [place]. Esophagogastroduodenoscopy, Care After Refer to this sheet in the next few weeks. These instructions provide you with information about caring for yourself after your procedure. Your health care provider may also give you more specific instructions. Your treatment has been planned according to current medical practices, but problems sometimes occur. Call your health care provider if you have any problems or questions after your procedure. WHAT TO EXPECT AFTER THE PROCEDURE After your procedure, it is typical to feel:  Soreness in your throat.  Pain with  swallowing.  Sick to your stomach (nauseous).  Bloated.  Dizzy.  Fatigued. HOME CARE INSTRUCTIONS  Do not eat or drink anything until the numbing medicine (local anesthetic) has worn off and your gag reflex has returned. You will know that the local anesthetic has worn off when you can swallow comfortably.  Do not drive or operate machinery until directed by your health care provider.  Take medicines only as directed by your health care provider. SEEK MEDICAL CARE IF:   You cannot stop coughing.  You are not urinating at all or less than usual. SEEK IMMEDIATE MEDICAL CARE IF:  You have difficulty swallowing.  You cannot eat or drink.  You have worsening throat or chest pain.  You have dizziness or lightheadedness or you faint.  You have nausea or vomiting.  You have chills.  You have a fever.  You have severe abdominal pain.  You have black, tarry, or bloody stools.   This information is not intended to replace advice given to you by your health care provider. Make sure you discuss any questions you have with your health care provider.   Document Released: 12/14/2011 Document Revised: 01/17/2014 Document  Reviewed: 12/14/2011 Elsevier Interactive Patient Education 2016 [place] Monitored anesthesia care is an anesthesia service for a medical procedure. Anesthesia is the loss of the ability to feel pain. It is produced by medicines called anesthetics. It may affect a small area of your body (local anesthesia), a large area of your body (regional anesthesia), or your entire body (general anesthesia). The need for monitored anesthesia care depends your procedure, your condition, and the potential need for regional or general anesthesia. It is often provided during procedures where:   General anesthesia may be needed if there are complications. This is because you need special care when you are under general anesthesia.   You will be under  local or regional anesthesia. This is so that you are able to have higher levels of anesthesia if needed.   You will receive calming medicines (sedatives). This is especially the case if sedatives are given to put you in a semi-conscious state of relaxation (deep sedation). This is because the amount of sedative needed to produce this state can be hard to predict. Too much of a sedative can produce general anesthesia. Monitored anesthesia care is performed by one or more health care providers who have special training in all types of anesthesia. You will need to meet with these health care providers before your procedure. During this meeting, they will ask you about your medical history. They will also give you instructions to follow. (For example, you will need to stop eating and drinking before your procedure. You may also need to stop or change medicines you are taking.) During your procedure, your health care providers will stay with you. They will:   Watch your condition. This includes watching your blood pressure, breathing, and level of pain.   Diagnose and treat problems that occur.   Give medicines if they are needed. These may include calming medicines (sedatives) and anesthetics.   Make sure you are comfortable.  Having monitored anesthesia care does not necessarily mean that you will be under anesthesia. It does mean that your health care providers will be able to manage anesthesia if you need it or if it occurs. It also means that you will be able to have a different type of anesthesia than you are having if you need it. When your procedure is complete, your health care providers will continue to watch your condition. They will make sure any medicines wear off before you are allowed to go home.    This information is not intended to replace advice given to you by your health care provider. Make sure you discuss any questions you have with your health care provider.   Document  Released: 09/22/2004 Document Revised: 01/17/2014 Document Reviewed: 02/08/2012 Elsevier Interactive Patient Education 2016 Elsevier Inc. PATIENT INSTRUCTIONS POST-ANESTHESIA  IMMEDIATELY FOLLOWING SURGERY:  Do not drive or operate machinery for the first twenty four hours after surgery.  Do not make any important decisions for twenty four hours after surgery or while taking narcotic pain medications or sedatives.  If you develop intractable nausea and vomiting or a severe headache please notify your doctor immediately.  FOLLOW-UP:  Please make an appointment with your surgeon as instructed. You do not need to follow up with anesthesia unless specifically instructed to do so.  WOUND CARE INSTRUCTIONS (if applicable):  Keep a dry clean dressing on the anesthesia/puncture wound site if there is drainage.  Once the wound has quit draining you may leave it open to air.  Generally you  should leave the bandage intact for twenty four hours unless there is drainage.  If the epidural site drains for more than 36-48 hours please call the anesthesia department.  QUESTIONS?:  Please feel free to call your physician or the hospital operator if you have any questions, and they will be happy to assist you.

## 2015-07-31 ENCOUNTER — Encounter (HOSPITAL_COMMUNITY): Payer: Self-pay

## 2015-07-31 ENCOUNTER — Encounter (HOSPITAL_COMMUNITY)
Admission: RE | Admit: 2015-07-31 | Discharge: 2015-07-31 | Disposition: A | Discharge status: Home / Self Care | Payer: Medicare Other | Source: Ambulatory Visit | Attending: Gastroenterology | Admitting: Gastroenterology

## 2015-07-31 DIAGNOSIS — Z01812 Encounter for preprocedural laboratory examination: Secondary | ICD-10-CM | POA: Insufficient documentation

## 2015-07-31 NOTE — Pre-Procedure Instructions (Signed)
Patient from [facility]. Written copy of letter from Dr Nona Dell office as well as written copy of our instructions sent back to [facility] and I called and spoke to Ramona concerning this also.

## 2015-08-04 ENCOUNTER — Ambulatory Visit (HOSPITAL_COMMUNITY)
Admission: RE | Admit: 2015-08-04 | Discharge: 2015-08-04 | Disposition: A | Discharge status: Skilled Nursing Facility | Payer: Medicare Other | Source: Ambulatory Visit | Attending: Gastroenterology | Admitting: Gastroenterology

## 2015-08-04 ENCOUNTER — Ambulatory Visit (HOSPITAL_COMMUNITY): Payer: Medicare Other | Admitting: Anesthesiology

## 2015-08-04 ENCOUNTER — Encounter (HOSPITAL_COMMUNITY): Payer: Self-pay | Admitting: Anesthesiology

## 2015-08-04 ENCOUNTER — Encounter (HOSPITAL_COMMUNITY)
Admission: RE | Disposition: A | Discharge status: Skilled Nursing Facility | Payer: Self-pay | Source: Ambulatory Visit | Attending: Gastroenterology

## 2015-08-04 DIAGNOSIS — Z79899 Other long term (current) drug therapy: Secondary | ICD-10-CM | POA: Insufficient documentation

## 2015-08-04 DIAGNOSIS — I10 Essential (primary) hypertension: Secondary | ICD-10-CM | POA: Insufficient documentation

## 2015-08-04 DIAGNOSIS — Z931 Gastrostomy status: Secondary | ICD-10-CM

## 2015-08-04 DIAGNOSIS — Z9842 Cataract extraction status, left eye: Secondary | ICD-10-CM | POA: Insufficient documentation

## 2015-08-04 DIAGNOSIS — Z8673 Personal history of transient ischemic attack (TIA), and cerebral infarction without residual deficits: Secondary | ICD-10-CM | POA: Diagnosis not present

## 2015-08-04 DIAGNOSIS — K2970 Gastritis, unspecified, without bleeding: Secondary | ICD-10-CM | POA: Diagnosis not present

## 2015-08-04 DIAGNOSIS — Z8501 Personal history of malignant neoplasm of esophagus: Secondary | ICD-10-CM | POA: Diagnosis present

## 2015-08-04 DIAGNOSIS — Z87891 Personal history of nicotine dependence: Secondary | ICD-10-CM | POA: Diagnosis not present

## 2015-08-04 DIAGNOSIS — C155 Malignant neoplasm of lower third of esophagus: Secondary | ICD-10-CM

## 2015-08-04 DIAGNOSIS — K449 Diaphragmatic hernia without obstruction or gangrene: Secondary | ICD-10-CM | POA: Insufficient documentation

## 2015-08-04 DIAGNOSIS — K222 Esophageal obstruction: Secondary | ICD-10-CM | POA: Insufficient documentation

## 2015-08-04 DIAGNOSIS — C159 Malignant neoplasm of esophagus, unspecified: Secondary | ICD-10-CM

## 2015-08-04 DIAGNOSIS — Z86010 Personal history of colonic polyps: Secondary | ICD-10-CM | POA: Diagnosis not present

## 2015-08-04 DIAGNOSIS — C154 Malignant neoplasm of middle third of esophagus: Secondary | ICD-10-CM | POA: Diagnosis not present

## 2015-08-04 DIAGNOSIS — Z9841 Cataract extraction status, right eye: Secondary | ICD-10-CM | POA: Insufficient documentation

## 2015-08-04 DIAGNOSIS — E785 Hyperlipidemia, unspecified: Secondary | ICD-10-CM | POA: Insufficient documentation

## 2015-08-04 HISTORY — PX: ESOPHAGOGASTRODUODENOSCOPY (EGD) WITH PROPOFOL: SHX5813

## 2015-08-04 HISTORY — PX: BIOPSY: SHX5522

## 2015-08-04 SURGERY — ESOPHAGOGASTRODUODENOSCOPY (EGD) WITH PROPOFOL
Anesthesia: Monitor Anesthesia Care

## 2015-08-04 MED ORDER — LIDOCAINE HCL (CARDIAC) 20 MG/ML IV SOLN
INTRAVENOUS | Status: DC | PRN
  Administered 2015-08-04: 10 mg via INTRAVENOUS

## 2015-08-04 MED ORDER — CHLORHEXIDINE GLUCONATE CLOTH 2 % EX PADS: 6.0000 | MEDICATED_PAD | Freq: Once | CUTANEOUS | Status: AC

## 2015-08-04 MED ORDER — LACTATED RINGERS IV SOLN
INTRAVENOUS | Status: DC
  Administered 2015-08-04: 09:00:00 via INTRAVENOUS

## 2015-08-04 MED ORDER — LIDOCAINE VISCOUS 2 % MT SOLN
15.0000 mL | Freq: Once | OROMUCOSAL | Status: AC
  Administered 2015-08-04: 15 mL via OROMUCOSAL

## 2015-08-04 MED ORDER — ONDANSETRON HCL 4 MG/2ML IJ SOLN: 4.0000 mg | Freq: Once | INTRAMUSCULAR | Status: DC | PRN

## 2015-08-04 MED ORDER — PROPOFOL 500 MG/50ML IV EMUL
INTRAVENOUS | Status: DC | PRN
  Administered 2015-08-04: 150 ug/kg/min via INTRAVENOUS

## 2015-08-04 MED ORDER — MIDAZOLAM HCL 2 MG/2ML IJ SOLN
1.0000 mg | INTRAMUSCULAR | Status: DC | PRN
  Administered 2015-08-04: 1 mg via INTRAVENOUS

## 2015-08-04 MED ORDER — FENTANYL CITRATE (PF) 100 MCG/2ML IJ SOLN
INTRAMUSCULAR | Status: AC
  Filled 2015-08-04: qty 2

## 2015-08-04 MED ORDER — PROPOFOL 10 MG/ML IV BOLUS
INTRAVENOUS | Status: AC
  Filled 2015-08-04: qty 40

## 2015-08-04 MED ORDER — MIDAZOLAM HCL 2 MG/2ML IJ SOLN
INTRAMUSCULAR | Status: AC
  Filled 2015-08-04: qty 2

## 2015-08-04 MED ORDER — LIDOCAINE VISCOUS 2 % MT SOLN
OROMUCOSAL | Status: AC
  Filled 2015-08-04: qty 15

## 2015-08-04 MED ORDER — FENTANYL CITRATE (PF) 100 MCG/2ML IJ SOLN
25.0000 ug | INTRAMUSCULAR | Status: DC | PRN
  Administered 2015-08-04: 25 ug via INTRAVENOUS

## 2015-08-04 MED ORDER — FENTANYL CITRATE (PF) 100 MCG/2ML IJ SOLN: 25.0000 ug | INTRAMUSCULAR | Status: DC | PRN

## 2015-08-04 NOTE — Anesthesia Postprocedure Evaluation (Signed)
Anesthesia Post Note  Patient: Billy Carson  Procedure(s) Performed: Procedure(s) (LRB): ESOPHAGOGASTRODUODENOSCOPY (EGD) WITH PROPOFOL (N/A) BIOPSY  Patient location during evaluation: PACU Anesthesia Type: MAC Level of consciousness: awake and alert and oriented Pain management: satisfactory to patient Vital Signs Assessment: post-procedure vital signs reviewed and stable Respiratory status: spontaneous breathing Cardiovascular status: blood pressure returned to baseline and stable Postop Assessment: no headache, no backache and no signs of nausea or vomiting Anesthetic complications: no    Last Vitals:  Vitals:   08/04/15 0945 08/04/15 0950  BP: 111/68 103/64  Pulse:    Resp: 18 16  Temp:      Last Pain: There were no vitals filed for this visit.               Talitha Givens

## 2015-08-04 NOTE — Discharge Instructions (Signed)
YOUR ESOPHAGEAL MASS IS MUCH IMPROVED. YOU STILL HAVE A FEW SPOTS LEFT IN YOUR ESOPHAGUS. YOUR MID-ESOPHAGUS IS STILL NARROW BUT IMPROVED SINCE JAN 2017. I WOULD KEEP THE FEEDING TUBE FOR NOW. WE CAN REPLACE IT WITH A BUTTON PEG SO YOU WILL NOT HAVE TO BE BOTHERED BY THE TUBING. I TRIED TO CALL DR. Whitney Muse AND AM AWAITING HER PHONE CALL. You ALSO have mild gastritis & A SMALL HIATAL HERNIA.   I BIOPSIED YOUR ESOPHAGUS. YOUR BIOPSY RESULTS WILL BE AVAILABLE IN MY CHART AFTER JUL 28 AND MY OFFICE WILL CONTACT YOU IN 10-14 DAYS WITH YOUR RESULTS.   CONTINUE A SOFT MECHANICAL DIET AND ENSURE QID.   ENDOSCOPY Care After Read the instructions outlined below and refer to this sheet in the next week. These discharge instructions provide you with general information on caring for yourself after you leave the hospital. While your treatment has been planned according to the most current medical practices available, unavoidable complications occasionally occur. If you have any problems or questions after discharge, call DR. FIELDS, [phone].  ACTIVITY  You may resume your regular activity, but move at a slower pace for the next 24 hours.   Take frequent rest periods for the next 24 hours.   Walking will help get rid of the air and reduce the bloated feeling in your belly (abdomen).   No driving for 24 hours (because of the medicine (anesthesia) used during the test).   You may shower.   Do not sign any important legal documents or operate any machinery for 24 hours (because of the anesthesia used during the test).    NUTRITION  Drink plenty of fluids.   You may resume your normal diet as instructed by your doctor.   Begin with a light meal and progress to your normal diet. Heavy or fried foods are harder to digest and may make you feel sick to your stomach (nauseated).   Avoid alcoholic beverages for 24 hours or as instructed.    MEDICATIONS  You may resume your normal  medications.   WHAT YOU CAN EXPECT TODAY  Some feelings of bloating in the abdomen.   Passage of more gas than usual.   Spotting of blood in your stool or on the toilet paper  .  IF YOU HAD POLYPS REMOVED DURING THE ENDOSCOPY:  Eat a soft diet IF YOU HAVE NAUSEA, BLOATING, ABDOMINAL PAIN, OR VOMITING.    FINDING OUT THE RESULTS OF YOUR TEST Not all test results are available during your visit. DR. Oneida Alar WILL CALL YOU WITHIN 14 DAYS OF YOUR PROCEDUE WITH YOUR RESULTS. Do not assume everything is normal if you have not heard from DR. FIELDS, CALL HER OFFICE AT [phone].  SEEK IMMEDIATE MEDICAL ATTENTION AND CALL THE OFFICE: [phone] IF:  You have more than a spotting of blood in your stool.   Your belly is swollen (abdominal distention).   You are nauseated or vomiting.   You have a temperature over 101F.   You have abdominal pain or discomfort that is severe or gets worse throughout the day.  General Anesthesia, Adult, Care After Refer to this sheet in the next few weeks. These instructions provide you with information on caring for yourself after your procedure. Your health care provider may also give you more specific instructions. Your treatment has been planned according to current medical practices, but problems sometimes occur. Call your health care provider if you have any problems or questions after your procedure. WHAT TO EXPECT  AFTER THE PROCEDURE After the procedure, it is typical to experience:  Sleepiness.  Nausea and vomiting. HOME CARE INSTRUCTIONS  For the first 24 hours after general anesthesia:  Have a responsible person with you.  Do not drive a car. If you are alone, do not take public transportation.  Do not drink alcohol.  Do not take medicine that has not been prescribed by your health care provider.  Do not sign important papers or make important decisions.  You may resume a normal diet and activities as directed by your health  care provider.  Change bandages (dressings) as directed.  If you have questions or problems that seem related to general anesthesia, call the hospital and ask for the anesthetist or anesthesiologist on call. SEEK MEDICAL CARE IF:  You have nausea and vomiting that continue the day after anesthesia.  You develop a rash. SEEK IMMEDIATE MEDICAL CARE IF:   You have difficulty breathing.  You have chest pain.  You have any allergic problems.   This information is not intended to replace advice given to you by your health care provider. Make sure you discuss any questions you have with your health care provider.

## 2015-08-04 NOTE — Progress Notes (Signed)
Gave report to Burnett Kanaris, LPN at [facility]. Sent orders over to [facility] from Dr. Oneida Alar. Acknowledged understanding of orders. No questions at this time.

## 2015-08-04 NOTE — Transfer of Care (Signed)
Immediate Anesthesia Transfer of Care Note  Patient: Billy Carson  Procedure(s) Performed: Procedure(s) with comments: ESOPHAGOGASTRODUODENOSCOPY (EGD) WITH PROPOFOL (N/A) - 815 BIOPSY - mid esophageal mass  Patient Location: PACU  Anesthesia Type:MAC  Level of Consciousness: awake, alert  and oriented  Airway & Oxygen Therapy: Patient Spontanous Breathing and Patient connected to nasal cannula oxygen  Post-op Assessment: Report given to RN and Post -op Vital signs reviewed and stable  Post vital signs: Reviewed and stable  Last Vitals:  Vitals:   08/04/15 0945 08/04/15 0950  BP: 111/68 103/64  Pulse:    Resp: 18 16  Temp:      Last Pain: There were no vitals filed for this visit.       Complications: No apparent anesthesia complications

## 2015-08-04 NOTE — Op Note (Signed)
[facility] Patient Name: Billy Carson Procedure Date: 08/04/2015 9:49 AM MRN: [id number] Date of Birth: 1945-09-05 Attending MD: Barney Drain , MD CSN: [id number] Age: 70 Admit Type: Outpatient Procedure:                Upper GI endoscopy with COLD FORCEPS BIOPSY Indications:              Surveillance for malignancy due to personal history                            of esophageal cancer, POOR DENTITION Providers:                Barney Drain, MD, Otis Peak B. Sharon Seller, RN, Randa Spike, Technician Referring MD:             Arman Bogus MD Medicines:                Propofol per Anesthesia Complications:            No immediate complications. Estimated Blood Loss:     Estimated blood loss was minimal. Procedure:                Pre-Anesthesia Assessment:                           - Prior to the procedure, a History and Physical                            was performed, and patient medications and                            allergies were reviewed. The patient's tolerance of                            previous anesthesia was also reviewed. The risks                            and benefits of the procedure and the sedation                            options and risks were discussed with the patient.                            All questions were answered, and informed consent                            was obtained. Prior Anticoagulants: The patient has                            taken no previous anticoagulant or antiplatelet                            agents. ASA Grade Assessment: II - A patient with  mild systemic disease. After reviewing the risks                            and benefits, the patient was deemed in                            satisfactory condition to undergo the procedure.                           After obtaining informed consent, the endoscope was                            passed under direct  vision. Throughout the                            procedure, the patient's blood pressure, pulse, and                            oxygen saturations were monitored continuously. The                            Endoscope was introduced through the mouth, and                            advanced to the second part of duodenum. The upper                            GI endoscopy was accomplished without difficulty.                            The patient tolerated the procedure well. Scope In: 10:07:33 AM Scope Out: 10:17:02 AM Total Procedure Duration: 0 hours 9 minutes 29 seconds  Findings:      Frequent small, raised masses with no bleeding AND INTERMITTEMNT NORMAL       APPEARING MUCOSA was found in the mid esophagus and in the distal       esophagus, 26 to 35 cm from the incisors. The mass was partially       obstructing. This was biopsied with a cold forceps for       histology(#1:33-35 CM FROM THE INCISORS), #2: 30-32 CM FROM THE       INCISORS, #3: 26-29 CM FROM THE INCISORS). STRICTURE IN MID ESOPHAGUS       WHICH ALLOWED SCOPE TO PASS WITH MILD RESISTANCE.      Mild inflammation was found in the stomach.      There was evidence of an intact gastrostomy with a patent G-tube present       in the gastric body.      The examined duodenum was normal. 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                           - Intact gastrostomy with a patent G-tube present.                           - Normal examined duodenum. Moderate Sedation:      Per Anesthesia Care Recommendation:           - Await pathology results.                           - Soft diet.                            - Continue present medications.                           - Return to my office PRN.                           KEEP THE FEEDING TUBE FOR NOW. CONSIDER REPLACEMENT                            WITH A LOW PROFILE PEGDISCUSSED WITH DR. Whitney Muse                           RECOMMENDED A SOFT MECHANICAL DIET. PT REFUSING.                            REGULAR DIET BUT NO MEATS NEEDS TO BE CHOPPED OR                            SHREDDED. SOFT VEGGIES AND FRUITS OK. ENSURE QID BM.                           ATTEMPTED TO CALL DAUGHTER TO DISCUSS. VM FULL.                           - Patient has a contact number available for                            emergencies. The signs and symptoms of potential                            delayed complications were discussed with the  patient. Return to normal activities tomorrow.                            Written discharge instructions were provided to the                            patient. Procedure Code(s):        --- Professional ---                           [phone], Esophagogastroduodenoscopy, flexible,                            transoral; with biopsy, single or multiple Diagnosis Code(s):        --- Professional ---                           C15.4, Malignant neoplasm of middle third of                            esophagus                           C15.5, Malignant neoplasm of lower third of                            esophagus                           K29.70, Gastritis, unspecified, without bleeding                           Z93.1, Gastrostomy status CPT copyright 2016 [employer]. All rights reserved. The codes documented in this report are preliminary and upon coder review may  be revised to meet current compliance requirements. Barney Drain, MD Barney Drain, MD 08/04/2015 9:57:31 PM This report has been signed electronically. Number of Addenda: 0

## 2015-08-04 NOTE — H&P (Signed)
Primary Care Physician:  Jani Gravel, MD Primary Gastroenterologist:  Dr. Oneida Alar  Pre-Procedure History & Physical: HPI:  Billy Carson is a 70 y.o. male here for Assess response to therapy after CHEMO/XRT FOR SQ CELL CA OF ESOPHAGUS.  Past Medical History:  Diagnosis Date  . Hyperlipidemia   . Hypertension   . 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 Stroke [facility])    left sided weakness    Past Surgical History:  Procedure Laterality Date  . BIOPSY N/A 02/10/2015   Procedure: BIOPSY;  Surgeon: Danie Binder, MD;  [facility];  Service: Endoscopy;  Laterality: N/A;  Esophageal mass biopsies, Gastric biopsies  . CATARACT EXTRACTION W/PHACO Left 10/14/2013   Procedure: CATARACT EXTRACTION PHACO AND INTRAOCULAR LENS PLACEMENT LEFT EYE CDE=11.04;  Surgeon: Williams Che, MD;  Location: [facility];  Service: Ophthalmology;  Laterality: Left;  . CATARACT EXTRACTION W/PHACO Right 12/30/2013   Procedure: CATARACT EXTRACTION PHACO AND INTRAOCULAR LENS PLACEMENT; CDE:  110.53;  Surgeon: Williams Che, MD;  Location: [facility];  Service: Ophthalmology;  Laterality: Right;  . COLONOSCOPY WITH PROPOFOL N/A 02/10/2015   Procedure: COLONOSCOPY WITH PROPOFOL;  Surgeon: Danie Binder, MD;  [facility];  Service: Endoscopy;  Laterality: N/A;  1030  . ESOPHAGOGASTRODUODENOSCOPY (EGD) WITH PROPOFOL N/A 02/10/2015   Procedure: ESOPHAGOGASTRODUODENOSCOPY (EGD) WITH PROPOFOL;  Surgeon: Danie Binder, MD;  [facility];  Service: Endoscopy;  Laterality: N/A;  . EUS N/A 03/05/2015   Procedure: UPPER ENDOSCOPIC ULTRASOUND (EUS) RADIAL;  Surgeon: Milus Banister, MD;  [facility];  Service: Endoscopy;  Laterality: N/A;  . PEG TUBE PLACEMENT    . POLYPECTOMY N/A 02/10/2015   Procedure: POLYPECTOMY;  Surgeon: Danie Binder, MD;  [facility];  Service: Endoscopy;  Laterality: N/A;  cecum x 1 and ascending colon polyp x1, Medium sigmoid colon  polyp, Medium and Large sigmoid colon polyps  . PORTACATH PLACEMENT  03/11/2015  . TONSILLECTOMY      Prior to Admission medications   Medication Sig Start Date End Date Taking? Authorizing Provider  acetaminophen (TYLENOL) 500 MG tablet Take 500 mg by mouth every 8 (eight) hours as needed (VIA G-TUBE FOR AS NEEDED FOR PAIN).   Yes Historical Provider, MD  atorvastatin (LIPITOR) 10 MG tablet Take 10 mg by mouth daily at 6 PM.  11/26/14  Yes Historical Provider, MD  CARBOPLATIN IV Inject into the vein. To be given once a week concurrently with XRT   Yes Historical Provider, MD  cyanocobalamin (,VITAMIN B-12,) 1000 MCG/ML injection Inject 1,000 mcg into the muscle every 30 (thirty) days.   Yes Historical Provider, MD  ENSURE PLUS (ENSURE PLUS) LIQD Take 237 mLs by mouth 2 (two) times daily.   Yes Historical Provider, MD  folic acid (FOLVITE) Q000111Q MCG tablet Take 400 mcg by mouth daily.   Yes Historical Provider, MD  guaiFENesin (ROBITUSSIN) 100 MG/5ML liquid Take 200 mg by mouth every 4 (four) hours as needed for cough (VIA  FEEDING TUBE).   Yes Historical Provider, MD  hydrALAZINE (APRESOLINE) 10 MG tablet Take 1 tablet (10 mg total) by mouth every 8 (eight) hours. Patient taking differently: Take 10 mg by mouth 2 (two) times daily.  09/11/12  Yes Costin Karlyne Greenspan, MD  metoprolol tartrate (LOPRESSOR) 25 MG tablet Take 12.5 mg by mouth 2 (two) times daily. Reported on 07/29/2015   Yes Historical Provider, MD  Nutritional Supplements (FEEDING SUPPLEMENT, OSMOLITE 1.2  CAL,) LIQD 1 can 4x daily (1000, 1400, 1800, 2200) Flush w/ 50 mls before and after each can: 400 extra ccs of fluid. 04/03/15  Yes Patrici Ranks, MD  nystatin (MYCOSTATIN) 100000 UNIT/ML suspension 5 mLs every 4 (four) hours as needed.  04/11/15  Yes Historical Provider, MD  ondansetron (ZOFRAN ODT) 8 MG disintegrating tablet Take 1 tablet (8 mg total) by mouth every 8 (eight) hours as needed for nausea or vomiting. 03/23/15  Yes Patrici Ranks, MD  PACLitaxel (TAXOL IV) Inject into the vein. Reported on 06/19/2015   Yes Historical Provider, MD  polyethylene glycol (MIRALAX / GLYCOLAX) packet Take 17 g by mouth daily.   Yes Historical Provider, MD  prochlorperazine (COMPAZINE) 10 MG tablet Take 1 tablet (10 mg total) by mouth every 6 (six) hours as needed for nausea or vomiting. 03/23/15  Yes Patrici Ranks, MD  promethazine (PHENERGAN) 25 MG tablet Take 12.5 mg by mouth every 6 (six) hours as needed for nausea or vomiting.    Yes Historical Provider, MD  PROMETHAZINE HCL IJ Inject 12.5 mg as directed every 6 (six) hours as needed (Nausea and vomitting). Reported on 07/29/2015   Yes Historical Provider, MD  senna-docusate (SENOKOT-S) 8.6-50 MG per tablet Take 1 tablet by mouth daily.   Yes Historical Provider, MD  Vitamin D, Ergocalciferol, (DRISDOL) 50000 units CAPS capsule Take 50,000 Units by mouth every 30 (thirty) days.   Yes Historical Provider, MD  lidocaine-prilocaine (EMLA) cream Apply a quarter size amount to port site 1 hour prior to chemo. Do not rub in. Cover with plastic wrap. Patient not taking: Reported on 06/19/2015 03/23/15   Patrici Ranks, MD    Allergies as of 07/27/2015  . (No Known Allergies)    Family History  Problem Relation Age of Onset  . Colon cancer Neg Hx     Social History   Social History  . Marital status: Married    Spouse name: N/A  . Number of children: N/A  . Years of education: N/A   Occupational History  . Not on file.   Social History Main Topics  . Smoking status: Former Smoker    Packs/day: 1.00    Years: 20.00    Types: Cigarettes    Quit date: 10/03/2012  . Smokeless tobacco: Never Used  . Alcohol use No     Comment: Denies current ETOH since 12/2013; Previously 1 pint per day per pt 12/24/2013 x 8-10 years.  . Drug use: No  . Sexual activity: No   Other Topics Concern  . Not on file   Social History Narrative  . No narrative on file    Review of  Systems: See HPI, otherwise negative ROS   Physical Exam: There were no vitals taken for this visit. General:   Alert,  pleasant and cooperative in NAD Head:  Normocephalic and atraumatic. Neck:  Supple; Lungs:  Clear throughout to auscultation.    Heart:  Regular rate and rhythm. Abdomen:  Soft, nontender and nondistended. Normal bowel sounds, without guarding, and without rebound.   Neurologic:  Alert and  oriented x4;  grossly normal neurologically.  Impression/Plan:    Assess response to therapy after CHEMO/XRT FOR SQ CELL CA OF ESOPHAGUS  PLAN: 1. EGD WITH POSSIBLE BIOPSY TODAY

## 2015-08-04 NOTE — Anesthesia Preprocedure Evaluation (Signed)
Anesthesia Evaluation  Patient identified by MRN, date of birth, ID band Patient awake    Reviewed: Allergy & Precautions, H&P , NPO status , Patient's Chart, lab work & pertinent test results  Airway Mallampati: II  TM Distance: >3 FB     Dental  (+) Edentulous Upper, Edentulous Lower   Pulmonary former smoker,    breath sounds clear to auscultation       Cardiovascular hypertension, Pt. on medications  Rhythm:Regular Rate:Normal     Neuro/Psych CVA, Residual Symptoms    GI/Hepatic (+)     substance abuse  alcohol use,   Endo/Other    Renal/GU      Musculoskeletal   Abdominal   Peds  Hematology   Anesthesia Other Findings   Reproductive/Obstetrics                             Anesthesia Physical Anesthesia Plan  ASA: III  Anesthesia Plan: MAC   Post-op Pain Management:    Induction: Intravenous  Airway Management Planned: Simple Face Mask  Additional Equipment:   Intra-op Plan:   Post-operative Plan:   Informed Consent: I have reviewed the patients History and Physical, chart, labs and discussed the procedure including the risks, benefits and alternatives for the proposed anesthesia with the patient or authorized representative who has indicated his/her understanding and acceptance.     Plan Discussed with:   Anesthesia Plan Comments:         Anesthesia Quick Evaluation

## 2015-08-10 ENCOUNTER — Encounter (HOSPITAL_COMMUNITY): Payer: Self-pay | Admitting: Gastroenterology

## 2015-08-10 NOTE — Progress Notes (Signed)
Tried to call and could not leave a VM.

## 2015-08-11 ENCOUNTER — Ambulatory Visit (HOSPITAL_COMMUNITY): Payer: Medicare Other | Admitting: Oncology

## 2015-08-11 NOTE — Progress Notes (Deleted)
Jani Gravel, MD [address] [place] [address]  Squamous cell carcinoma of esophagus [facility])  CURRENT THERAPY: Surveillance  INTERVAL HISTORY: Billy Carson 70 y.o. 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  He is S/P concomitant chemoradiation consisting of weekly Carboplatin/Paclitaxel + XRT 03/23/2015- 05/01/2015.  He underwent repeat EGD by Dr. Oneida Alar on 08/04/2015 showing persistent tumor in the mid-esophagus improved compared to Jan 2017.    Squamous cell carcinoma of esophagus ([place])   02/10/2015 Procedure    Colonoscopy by Dr. Oneida Alar     02/10/2015 Pathology Results    Colon, polyp(s), sigmoid - TRADITIONAL SERRATED ADENOMA (2.3 CM) WITH HIGH GRADE DYSPLASIA. - HIGH GRADE DYSPLASIA INVOLVES APPROXIMATELY 10 TO 15% OF THE POLYP. - CAUTERIZED EDGES ARE FREE OF HIGH GRADE DYSPLASIA BUT ARE INVOLVED WITH ADENOMATOUS CHAN     02/10/2015 Procedure    EGD by Dr. Oneida Alar     02/10/2015 Pathology Results    Esophagus, biopsy, mass - INVASIVE SQUAMOUS CELL CARCINOMA.     02/25/2015 PET scan    Esophageal mass extends longitudinally over about 6 cm and is associated with very hypermetabolic activity. No definite separable adenopathy in the chest or other metastatic disease identified. Coarsened trabeculation with very faintly accentuated...     03/11/2015 Procedure    Port placement by IR     03/23/2015 - 04/30/2015 Radiation Therapy    Dr. Lisbeth Renshaw, completed on 4/20     03/27/2015 - 05/01/2015 Chemotherapy    Carboplaton/Paclitaxel weekly      04/01/2015 Procedure    PEG placed by IR     04/22/2015 Miscellaneous    CTS consult with Dr. Servando Snare for consideration of surgical intervention     06/16/2015 Imaging    CT C/A motion degraded images, prior mid esopahgeal mass no longer visualized, no evidence  metastatic disease to chest or abdomen. gastrostomy tube in satisfactory position     07/02/2015 Imaging    CT head- Chronic RIGHT basal ganglia/ centrum semiovale, and chronic LEFT parasagittal parietal infarcts. No intracranial metastatic disease is evident. Ill-defined hypodensities in the LEFT greater than RIGHT thalami.     08/04/2015 Procedure    EGD by Dr. Oneida Alar- Partially obstructing, malignant esophageal tumor in the mid-esophagus and in the distal esophagus, clinically improved compared to Jan 2017. 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 Mild Gastritis.       ROS  Past Medical History:  Diagnosis Date  . Hyperlipidemia   . Hypertension   . 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 Stroke [facility])    left sided weakness    Past Surgical History:  Procedure Laterality Date  . BIOPSY N/A 02/10/2015   Procedure: BIOPSY;  Surgeon: Danie Binder, MD;  [facility];  Service: Endoscopy;  Laterality: N/A;  Esophageal mass biopsies, Gastric biopsies  . BIOPSY  08/04/2015   Procedure: BIOPSY;  Surgeon: Danie Binder, MD;  [facility];  Service: Endoscopy;;  mid esophageal mass  . CATARACT EXTRACTION W/PHACO Left 10/14/2013   Procedure: CATARACT EXTRACTION PHACO AND INTRAOCULAR LENS PLACEMENT LEFT EYE CDE=11.04;  Surgeon: Williams Che, MD;  Location: [facility];  Service: Ophthalmology;  Laterality: Left;  . CATARACT EXTRACTION W/PHACO Right 12/30/2013   Procedure: CATARACT EXTRACTION PHACO AND INTRAOCULAR LENS PLACEMENT; CDE:  110.53;  Surgeon: Williams Che, MD;  Location: [facility];  Service: Ophthalmology;  Laterality: Right;  . COLONOSCOPY WITH PROPOFOL N/A 02/10/2015   Procedure: COLONOSCOPY WITH PROPOFOL;  Surgeon: Danie Binder, MD;  [facility];  Service: Endoscopy;  Laterality: N/A;  1030  . ESOPHAGOGASTRODUODENOSCOPY (EGD) WITH PROPOFOL N/A 02/10/2015   Procedure: ESOPHAGOGASTRODUODENOSCOPY (EGD) WITH  PROPOFOL;  Surgeon: Danie Binder, MD;  [facility];  Service: Endoscopy;  Laterality: N/A;  . ESOPHAGOGASTRODUODENOSCOPY (EGD) WITH PROPOFOL N/A 08/04/2015   Procedure: ESOPHAGOGASTRODUODENOSCOPY (EGD) WITH PROPOFOL;  Surgeon: Danie Binder, MD;  [facility];  Service: Endoscopy;  Laterality: N/A;  815  . EUS N/A 03/05/2015   Procedure: UPPER ENDOSCOPIC ULTRASOUND (EUS) RADIAL;  Surgeon: Milus Banister, MD;  [facility];  Service: Endoscopy;  Laterality: N/A;  . PEG TUBE PLACEMENT    . POLYPECTOMY N/A 02/10/2015   Procedure: POLYPECTOMY;  Surgeon: Danie Binder, MD;  [facility];  Service: Endoscopy;  Laterality: N/A;  cecum x 1 and ascending colon polyp x1, Medium sigmoid colon polyp, Medium and Large sigmoid colon polyps  . PORTACATH PLACEMENT  03/11/2015  . TONSILLECTOMY      Family History  Problem Relation Age of Onset  . Colon cancer Neg Hx     Social History   Social History  . Marital status: Married    Spouse name: N/A  . Number of children: N/A  . Years of education: N/A   Social History Main Topics  . Smoking status: Former Smoker    Packs/day: 1.00    Years: 20.00    Types: Cigarettes    Quit date: 10/03/2012  . Smokeless tobacco: Never Used  . Alcohol use No     Comment: Denies current ETOH since 12/2013; Previously 1 pint per day per pt 12/24/2013 x 8-10 years.  . Drug use: No  . Sexual activity: No   Other Topics Concern  . Not on file   Social History Narrative  . No narrative on file     PHYSICAL EXAMINATION  ECOG PERFORMANCE STATUS: {CHL ONC ECOG PS:[id number]}  There were no vitals filed for this visit.  GENERAL:{CHL ONC PE GENERAL:[phone]} SKIN: {CHL ONC PE HT:[id number] HEAD: {CHL ONC PE AE:[id number] EYES: {CHL ONC PE EY:[id number] EARS: {CHL ONC PE AC:[id number] OROPHARYNX:{CHL ONC PE OROPHARYNX:[phone]}  NECK: {CHL ONC PE FZ:[id number] LYMPH:  {CHL ONC PE  RP:[id number] BREAST:{CHL ONC PE [phone]} LUNGS: {CHL ONC PE LK:[id number] HEART: {CHL ONC PE LA:[id number] ABDOMEN:{CHL ONC PE ABDOMEN:[phone]} BACK: {CHL ONC PE YK:9999879 EXTREMITIES:{CHL ONC PE [phone]}  NEURO: {CHL ONC PE NEURO:[id number]} PELVIC:{CHL ONC PE PELVIC:[phone]} RECTAL: {CHL ONC PE RECTAL:[phone]}   LABORATORY DATA: CBC    Component Value Date/Time   WBC 3.3 (L) 05/08/2015 1134   RBC 3.22 (L) 05/08/2015 1134   HGB 10.3 (L) 05/08/2015 1134   HCT 30.2 (L) 05/08/2015 1134   PLT 101 (L) 05/08/2015 1134   MCV 93.8 05/08/2015 1134   MCH 32.0 05/08/2015 1134   MCHC 34.1 05/08/2015 1134   RDW 13.8 05/08/2015 1134   LYMPHSABS 0.3 (L) 05/08/2015 1134   MONOABS 0.9 05/08/2015 1134   EOSABS 0.0 05/08/2015 1134   BASOSABS 0.0 05/08/2015 1134      Chemistry      Component Value Date/Time   NA 134 (L) 05/08/2015 1134   K 4.5 05/08/2015 1134   CL 101 05/08/2015 1134   CO2 24 05/08/2015 1134   BUN 16 05/08/2015 1134  CREATININE 1.20 07/02/2015 1412      Component Value Date/Time   CALCIUM 9.4 05/08/2015 1134   ALKPHOS 63 05/08/2015 1134   AST 17 05/08/2015 1134   ALT 18 05/08/2015 1134   BILITOT 0.2 (L) 05/08/2015 1134        PENDING LABS:   RADIOGRAPHIC STUDIES:  No results found.   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  He is S/P concomitant chemoradiation consisting of weekly Carboplatin/Paclitaxel + XRT 03/23/2015- 05/01/2015.  He underwent repeat EGD by Dr. Oneida Alar on 08/04/2015 showing persistent tumor in the mid-esophagus improved compared to Jan 2017.  Oncology history updated.  The patient saw Dr. Servando Snare on 06/19/2015.  No surgery will be pursued given comorbidities.  NCCN guidelines  are reviewed in detail regarding options moving forward.     ORDERS PLACED FOR THIS ENCOUNTER: No orders of the defined types were placed in this encounter.   MEDICATIONS PRESCRIBED THIS ENCOUNTER: No orders of the defined types were placed in this encounter.   THERAPY PLAN:  ***  All questions were answered. The patient knows to call the clinic with any problems, questions or concerns. We can certainly see the patient much sooner if necessary.  Patient and plan discussed with Dr. Ancil Linsey and she is in agreement with the aforementioned.   This note is electronically signed by: Doy Mince 08/11/2015 8:19 AM

## 2015-08-11 NOTE — Assessment & Plan Note (Deleted)
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  He is S/P concomitant chemoradiation consisting of weekly Carboplatin/Paclitaxel + XRT 03/23/2015- 05/01/2015.  He underwent repeat EGD by Dr. Oneida Alar on 08/04/2015 showing persistent tumor in the mid-esophagus improved compared to Jan 2017.  Oncology history updated.  The patient saw Dr. Servando Snare on 06/19/2015.  No surgery will be pursued given comorbidities.  NCCN guidelines are reviewed in detail regarding options moving forward.

## 2015-08-12 NOTE — Progress Notes (Signed)
LMOM for a return call.  

## 2015-08-12 NOTE — Progress Notes (Signed)
I have been unable to reach pt's daughter, Billy Carson. I called and told Romona at [facility] and an faxing the info to her to give to the nurse for his records. Faxing to [phone].

## 2015-09-21 ENCOUNTER — Encounter (HOSPITAL_COMMUNITY): Payer: Medicare Other

## 2015-10-22 ENCOUNTER — Ambulatory Visit (INDEPENDENT_AMBULATORY_CARE_PROVIDER_SITE_OTHER): Payer: Medicare Other | Admitting: Nurse Practitioner

## 2015-10-22 ENCOUNTER — Encounter: Payer: Self-pay | Admitting: Nurse Practitioner

## 2015-10-22 DIAGNOSIS — R1310 Dysphagia, unspecified: Secondary | ICD-10-CM

## 2015-10-22 NOTE — Assessment & Plan Note (Signed)
The patient has esophageal cancer. Recent endoscopy found persistent but improved disease. Within the past couple weeks has begun having worsening dysphagia and at this point is that his 0% by mouth intake, per the facility. He does have a PEG tube and is receiving nutrition via PEG. Given this and his worsening sore throat, I am concerned about potential advancement of his disease. He hasn't appointment with hematology/oncology tomorrow to discuss. I spoke with [name], PA at [facility] who states that there are no options left and able likely need to have a hospice discussion with him. Return for follow-up as needed.

## 2015-10-22 NOTE — Progress Notes (Signed)
Referring Provider: Jani Gravel, MD Primary Care Physician:  Jani Gravel, MD Primary GI:  Dr. Oneida Alar  Chief Complaint  Patient presents with  . Dysphagia    throat hurts, very little appetite, using tube feeding. Going on for one week    HPI:   Billy Carson is a 70 y.o. male who presents For follow-up from of onset a facility due to poor appetite, no by mouth intake for 1 week. The patient was last seen in our office 12/24/2014 for alcohol abuse and colonoscopy. At that time he was a 70 year old male with never had a colonoscopy, denied any other upper or lower GI symptoms. Recommended colonoscopy. Possible upper endoscopy was admitted due to anemia.  Colonoscopy and endoscopy completed 02/10/2015. Colonoscopy found multiple colon polyps which were found to be a mix of tubular adenoma and serrated adenoma with high-grade dysplasia also found large internal hemorrhoids. EGD completed the same day found an esophageal mass 25-31 cm from the teeth with a narrowed lumen, Schatzki's ring at EG junction, small hiatal hernia. 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 Overall anemia due to esophageal mass, chronic renal insufficiency, and colon polyps. Also doubt gastritis and duodenitis. He was referred to oncology, consider repeat colonoscopy in one year (February 2018) if his health permits.  EUS for staging was completed. He was referred to radiation oncology and [facility]. The patient saw CVTS and declined surgical resection.. He has been undergoing chemotherapy and radiation therapy and had a PEG tube placed by interventional radiology on 04/01/2015. At last office visit on 07/27/2015 with oncology he was receiving tube feeds twice daily and in the previous couple days had not use at all and was eating without difficulty, denied pain, overall doing well. Recommended repeat upper endoscopy.  Repeat EGD completed 08/04/2015 which found a partially obstructing  malignant esophageal tumor in the midesophagus and in the distal esophagus clinically improved since January 2017. Mid esophageal stricture causing partial occlusion in the lumen, mild gastritis, intact gastrostomy tube present, normal duodenum. Esophageal biopsies found squamous cell carcinoma. Deemed esophageal cancer improved but not resolved. Recommended follow-up with GI as needed, keep feeding tube for now and consider replacement with a low profile PEG, which was discussed with Dr. Whitney Muse. Recommended soft mechanical diet which the patient is refusing. Instructed that he have a regular diet but no meats, food needs to be chopped, soft veggies and fruits, ensure supplement.  Facility provider note included and reviewed. Noted sore throat and worsening dyspnea; No recent radiation. They have increased his feeding supplements.  Today he states he hasn't been feeling too good lately. States he has completed chemo and radiation. Began with a significant sore throat a few weeks ago. Although this has improved, it is still present. Has also had worsening dysphagia symptoms despite chopped/soft foods. Per nursing home report, the patient is taking in minimal po intake. He does have a PEG tube and is able to receive nutrition via PEG. Admits some mid to upper abdominal pain which is not severe, Denies overt hematochezia or melena. No other GI signs or symptoms.   Past Medical History:  Diagnosis Date  . Hyperlipidemia   . Hypertension   . 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 Stroke [facility])    left sided weakness    Past Surgical History:  Procedure Laterality Date  . BIOPSY N/A 02/10/2015   Procedure: BIOPSY;  Surgeon: Danie Binder, MD;  [facility];  Service: Endoscopy;  Laterality: N/A;  Esophageal mass biopsies, Gastric biopsies  . BIOPSY  08/04/2015   Procedure: BIOPSY;  Surgeon: Danie Binder, MD;  [facility];  Service: Endoscopy;;  mid esophageal  mass  . CATARACT EXTRACTION W/PHACO Left 10/14/2013   Procedure: CATARACT EXTRACTION PHACO AND INTRAOCULAR LENS PLACEMENT LEFT EYE CDE=11.04;  Surgeon: Williams Che, MD;  Location: [facility];  Service: Ophthalmology;  Laterality: Left;  . CATARACT EXTRACTION W/PHACO Right 12/30/2013   Procedure: CATARACT EXTRACTION PHACO AND INTRAOCULAR LENS PLACEMENT; CDE:  110.53;  Surgeon: Williams Che, MD;  Location: [facility];  Service: Ophthalmology;  Laterality: Right;  . COLONOSCOPY WITH PROPOFOL N/A 02/10/2015   Procedure: COLONOSCOPY WITH PROPOFOL;  Surgeon: Danie Binder, MD;  [facility];  Service: Endoscopy;  Laterality: N/A;  1030  . ESOPHAGOGASTRODUODENOSCOPY (EGD) WITH PROPOFOL N/A 02/10/2015   Procedure: ESOPHAGOGASTRODUODENOSCOPY (EGD) WITH PROPOFOL;  Surgeon: Danie Binder, MD;  [facility];  Service: Endoscopy;  Laterality: N/A;  . ESOPHAGOGASTRODUODENOSCOPY (EGD) WITH PROPOFOL N/A 08/04/2015   Procedure: ESOPHAGOGASTRODUODENOSCOPY (EGD) WITH PROPOFOL;  Surgeon: Danie Binder, MD;  [facility];  Service: Endoscopy;  Laterality: N/A;  815  . EUS N/A 03/05/2015   Procedure: UPPER ENDOSCOPIC ULTRASOUND (EUS) RADIAL;  Surgeon: Milus Banister, MD;  [facility];  Service: Endoscopy;  Laterality: N/A;  . PEG TUBE PLACEMENT    . POLYPECTOMY N/A 02/10/2015   Procedure: POLYPECTOMY;  Surgeon: Danie Binder, MD;  [facility];  Service: Endoscopy;  Laterality: N/A;  cecum x 1 and ascending colon polyp x1, Medium sigmoid colon polyp, Medium and Large sigmoid colon polyps  . PORTACATH PLACEMENT  03/11/2015  . TONSILLECTOMY      Current Outpatient Prescriptions  Medication Sig Dispense Refill  . acetaminophen (TYLENOL) 500 MG tablet Take 500 mg by mouth every 8 (eight) hours as needed (VIA G-TUBE FOR AS NEEDED FOR PAIN).    Marland Kitchen atorvastatin (LIPITOR) 10 MG tablet Take 10 mg by mouth daily at 6 PM.     . cyanocobalamin (,VITAMIN B-12,) 1000 MCG/ML  injection Inject 1,000 mcg into the muscle every 30 (thirty) days.    . folic acid (FOLVITE) [id number] MCG tablet Take 800 mcg by mouth daily.     Marland Kitchen guaiFENesin (ROBITUSSIN) 100 MG/5ML liquid Take 200 mg by mouth every 4 (four) hours as needed for cough (VIA  FEEDING TUBE).    . hydrALAZINE (APRESOLINE) 10 MG tablet Take 1 tablet (10 mg total) by mouth every 8 (eight) hours. (Patient taking differently: Take 10 mg by mouth 2 (two) times daily. ) 90 tablet 1  . metoprolol tartrate (LOPRESSOR) 25 MG tablet Take 12.5 mg by mouth 2 (two) times daily. Reported on 07/29/2015    . Multiple Vitamin (MULTIVITAMIN) tablet Take 1 tablet by mouth daily.    . Nutritional Supplements (ISOSOURCE 1.5 CAL PO) 250 mLs by Feeding Tube route 4 (four) times daily. With 47ml water flushes before and after feeding    . nystatin (MYCOSTATIN) 100000 UNIT/ML suspension 5 mLs every 4 (four) hours as needed.     . ondansetron (ZOFRAN ODT) 8 MG disintegrating tablet Take 1 tablet (8 mg total) by mouth every 8 (eight) hours as needed for nausea or vomiting. 30 tablet 2  . polyethylene glycol (MIRALAX / GLYCOLAX) packet Take 17 g by mouth daily.    . prochlorperazine (COMPAZINE) 10 MG tablet Take 1 tablet (10 mg total) by mouth  every 6 (six) hours as needed for nausea or vomiting. 30 tablet 2  . promethazine (PHENERGAN) 25 MG tablet Take 12.5 mg by mouth every 6 (six) hours as needed for nausea or vomiting.     . senna-docusate (SENOKOT-S) 8.6-50 MG per tablet Take 1 tablet by mouth 2 (two) times daily.     . Vitamin D, Ergocalciferol, (DRISDOL) 50000 units CAPS capsule Take 50,000 Units by mouth every 30 (thirty) days.    . CARBOPLATIN IV Inject into the vein. To be given once a week concurrently with XRT    . ENSURE PLUS (ENSURE PLUS) LIQD Take 237 mLs by mouth 2 (two) times daily.    Marland Kitchen lidocaine-prilocaine (EMLA) cream Apply a quarter size amount to port site 1 hour prior to chemo. Do not rub in. Cover with plastic wrap. (Patient not  taking: Reported on 10/22/2015) 30 g 3  . Nutritional Supplements (FEEDING SUPPLEMENT, OSMOLITE 1.2 CAL,) LIQD 1 can 4x daily (1000, 1400, 1800, 2200) Flush w/ 50 mls before and after each can: 400 extra ccs of fluid. (Patient not taking: Reported on 10/22/2015)  0  . PACLitaxel (TAXOL IV) Inject into the vein. Reported on 06/19/2015    . PROMETHAZINE HCL IJ Inject 12.5 mg as directed every 6 (six) hours as needed (Nausea and vomitting). Reported on 07/29/2015     No current facility-administered medications for this visit.     Allergies as of 10/22/2015  . (No Known Allergies)    Family History  Problem Relation Age of Onset  . Colon cancer Neg Hx     Social History   Social History  . Marital status: Married    Spouse name: N/A  . Number of children: N/A  . Years of education: N/A   Social History Main Topics  . Smoking status: Former Smoker    Packs/day: 1.00    Years: 20.00    Types: Cigarettes    Quit date: 10/03/2012  . Smokeless tobacco: Never Used  . Alcohol use No     Comment: Denies current ETOH since 12/2013; Previously 1 pint per day per pt 12/24/2013 x 8-10 years.  . Drug use: No  . Sexual activity: No   Other Topics Concern  . None   Social History Narrative  . None    Review of Systems: Complete ROS negative except as per HPI.   Physical Exam: BP 129/73   Pulse 86   Temp 98 F (36.7 C) (Oral)   Ht 6' (1.829 m)   Wt 192 lb 12.8 oz (87.5 kg)   BMI 26.15 kg/m  General:   Alert and oriented. Pleasant and cooperative. Well-nourished and well-developed. Sitting in wheelchair. Seems to be in good spirits. Head:  Normocephalic and atraumatic. Eyes:  Without icterus, sclera clear and conjunctiva pink.  Ears:  Normal auditory acuity. Cardiovascular:  S1, S2 present without murmurs appreciated. Extremities without clubbing or edema. Respiratory:  Clear to auscultation bilaterally. No wheezes, rales, or rhonchi. No distress.  Gastrointestinal:  +BS, soft,  non-tender and non-distended. No HSM noted. No guarding or rebound. No masses appreciated.  Rectal:  Deferred  Musculoskalatal:  Symmetrical without gross deformities. Neurologic:  Alert and oriented x4;  grossly normal neurologically. Psych:  Alert and cooperative. Normal mood and affect. Heme/Lymph/Immune: No excessive bruising noted.    10/22/2015 10:51 AM   Disclaimer: This note was dictated with voice recognition software. Similar sounding words can inadvertently be transcribed and may not be corrected upon review.

## 2015-10-22 NOTE — Patient Instructions (Signed)
1. You have an appointment tomorrow with the cancer center. 2. Return for follow-up here as needed or as recommended by Oncology.

## 2015-10-22 NOTE — Progress Notes (Signed)
CC'ED TO PCP 

## 2015-10-23 ENCOUNTER — Encounter (HOSPITAL_COMMUNITY): Payer: Self-pay | Admitting: Lab

## 2015-10-23 ENCOUNTER — Encounter (HOSPITAL_COMMUNITY): Payer: Self-pay | Admitting: Oncology

## 2015-10-23 ENCOUNTER — Encounter (HOSPITAL_COMMUNITY): Payer: Medicare Other | Attending: Hematology & Oncology | Admitting: Oncology

## 2015-10-23 DIAGNOSIS — R634 Abnormal weight loss: Secondary | ICD-10-CM

## 2015-10-23 DIAGNOSIS — C159 Malignant neoplasm of esophagus, unspecified: Secondary | ICD-10-CM | POA: Insufficient documentation

## 2015-10-23 NOTE — Progress Notes (Signed)
Billy Gravel, MD [address] [place] [address]  Squamous cell carcinoma of esophagus [facility])  CURRENT THERAPY: Surveillance  INTERVAL HISTORY: Billy Carson 70 y.o. 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  S/P concomitant chemoXRT with Carboplatin/Paclitaxel (03/27/2015- 05/01/2015).  Follow-up EGD on 08/04/2015 demonstrates persistent disease.    Squamous cell carcinoma of esophagus ([place])   02/10/2015 Procedure    Colonoscopy by Dr. Oneida Alar      02/10/2015 Pathology Results    Colon, polyp(s), sigmoid - TRADITIONAL SERRATED ADENOMA (2.3 CM) WITH HIGH GRADE DYSPLASIA. - HIGH GRADE DYSPLASIA INVOLVES APPROXIMATELY 10 TO 15% OF THE POLYP. - CAUTERIZED EDGES ARE FREE OF HIGH GRADE DYSPLASIA BUT ARE INVOLVED WITH ADENOMATOUS CHAN      02/10/2015 Procedure    EGD by Dr. Oneida Alar      02/10/2015 Pathology Results    Esophagus, biopsy, mass - INVASIVE SQUAMOUS CELL CARCINOMA.      02/25/2015 PET scan    Esophageal mass extends longitudinally over about 6 cm and is associated with very hypermetabolic activity. No definite separable adenopathy in the chest or other metastatic disease identified. Coarsened trabeculation with very faintly accentuated...      03/11/2015 Procedure    Port placement by IR      03/23/2015 - 04/30/2015 Radiation Therapy    Dr. Lisbeth Renshaw, completed on 4/20      03/27/2015 - 05/01/2015 Chemotherapy    Carbopl[place]/Paclitaxel weekly       04/01/2015 Procedure    PEG placed by IR      04/22/2015 Miscellaneous    CTS consult with Dr. Servando Snare for consideration of surgical intervention      06/16/2015 Imaging    CT C/A motion degraded images, prior mid esopahgeal mass no longer visualized, no evidence metastatic disease to chest or abdomen. gastrostomy tube in  satisfactory position      06/19/2015 Miscellaneous    Dr. Servando Snare (CTS) visit- "I do not think he would  benefit by undertaking a significant operation with his limited functional status. Patient today expressed desire not to have operation."      07/02/2015 Imaging    CT head- Chronic RIGHT basal ganglia/ centrum semiovale, and chronic LEFT parasagittal parietal infarcts. No intracranial metastatic disease is evident. Ill-defined hypodensities in the LEFT greater than RIGHT thalami.      08/04/2015 Procedure    EGD by Dr. Oneida Alar- Partially obstructing, malignant esophageal tumor in the mid-esophagus and in the distal esophagus, clinically improved compared to Jan 2017. 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 Mild Gastritis.      08/05/2015 Pathology Results    Diagnosis 1. Esophagus, biopsy, mid 35-33 cm from teeth - SQUAMOUS CELL CARCINOMA. 2. Esophagus, biopsy, 30-32 cm from teeth - SQUAMOUS CELL CARCINOMA. 3. Esophagus, biopsy, 26-29 cm from teeth - SQUMAOUS CELL CARCINOMA.      10/23/2015 Miscellaneous    Hospice referral      Patient failed to follow-up as scheduled following EGD on 08/11/2015.  He has been losing weight.  He still uses his PEG tube for nutrition.  He has developed progressive dysphagia.    He denies any complaints today.  Review of Systems  Constitutional: Positive for weight loss. Negative for chills and fever.  HENT: Negative.   Eyes: Negative.   Respiratory: Negative.  Cardiovascular: Negative.   Gastrointestinal: Negative.   Skin: Negative.   Neurological: Negative.   Psychiatric/Behavioral: Negative.     Past Medical History:  Diagnosis Date  . Hyperlipidemia   . Hypertension   . 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 Stroke [facility])    left sided weakness    Past Surgical History:  Procedure Laterality Date  . BIOPSY N/A 02/10/2015   Procedure: BIOPSY;  Surgeon: Danie Binder, MD;  [facility];  Service: Endoscopy;  Laterality: N/A;  Esophageal mass biopsies, Gastric biopsies  . BIOPSY  08/04/2015   Procedure: BIOPSY;  Surgeon: Danie Binder, MD;  [facility];  Service: Endoscopy;;  mid esophageal mass  . CATARACT EXTRACTION W/PHACO Left 10/14/2013   Procedure: CATARACT EXTRACTION PHACO AND INTRAOCULAR LENS PLACEMENT LEFT EYE CDE=11.04;  Surgeon: Williams Che, MD;  [facility];  Service: Ophthalmology;  Laterality: Left;  . CATARACT EXTRACTION W/PHACO Right 12/30/2013   Procedure: CATARACT EXTRACTION PHACO AND INTRAOCULAR LENS PLACEMENT; CDE:  110.53;  Surgeon: Williams Che, MD;  [facility]: [facility];  Service: Ophthalmology;  Laterality: Right;  . COLONOSCOPY WITH PROPOFOL N/A 02/10/2015   Procedure: COLONOSCOPY WITH PROPOFOL;  Surgeon: Danie Binder, MD;  [facility];  Service: Endoscopy;  Laterality: N/A;  1030  . ESOPHAGOGASTRODUODENOSCOPY (EGD) WITH PROPOFOL N/A 02/10/2015   Procedure: ESOPHAGOGASTRODUODENOSCOPY (EGD) WITH PROPOFOL;  Surgeon: Danie Binder, MD;  [facility];  Service: Endoscopy;  Laterality: N/A;  . ESOPHAGOGASTRODUODENOSCOPY (EGD) WITH PROPOFOL N/A 08/04/2015   Procedure: ESOPHAGOGASTRODUODENOSCOPY (EGD) WITH PROPOFOL;  Surgeon: Danie Binder, MD;  [facility];  Service: Endoscopy;  Laterality: N/A;  815  . EUS N/A 03/05/2015   Procedure: UPPER ENDOSCOPIC ULTRASOUND (EUS) RADIAL;  Surgeon: Milus Banister, MD;  [facility];  Service: Endoscopy;  Laterality: N/A;  . PEG TUBE PLACEMENT    . POLYPECTOMY N/A 02/10/2015   Procedure: POLYPECTOMY;  Surgeon: Danie Binder, MD;  [facility];  Service: Endoscopy;  Laterality: N/A;  cecum x 1 and ascending colon polyp x1, Medium sigmoid colon polyp, Medium and Large sigmoid colon polyps  . PORTACATH PLACEMENT  03/11/2015  . TONSILLECTOMY      Family History  Problem Relation Age of Onset  . Colon cancer Neg Hx     Social History    Social History  . Marital status: Married    Spouse name: N/A  . Number of children: N/A  . Years of education: N/A   Social History Main Topics  . Smoking status: Former Smoker    Packs/day: 1.00    Years: 20.00    Types: Cigarettes    Quit date: 10/03/2012  . Smokeless tobacco: Never Used  . Alcohol use No     Comment: Denies current ETOH since 12/2013; Previously 1 pint per day per pt 12/24/2013 x 8-10 years.  . Drug use: No  . Sexual activity: No   Other Topics Concern  . None   Social History Narrative  . None     PHYSICAL EXAMINATION  ECOG PERFORMANCE STATUS: 2 - Symptomatic, <50% confined to bed  Vitals:   10/23/15 1158  BP: 127/89  Pulse: (!) 102  Resp: 18  Temp: 99.1 F (37.3 C)    GENERAL:alert, no distress, comfortable, cooperative, smiling and accompanied by daughter. SKIN: skin color, texture, turgor are normal, no rashes or significant lesions HEAD: Normocephalic, No masses, lesions, tenderness or  abnormalities EYES: normal, EOMI, Conjunctiva are pink and non-injected EARS: External ears normal OROPHARYNX:lips, buccal mucosa, and tongue normal and mucous membranes are moist  NECK: supple, no adenopathy, trachea midline LYMPH:  no palpable lymphadenopathy BREAST:not examined LUNGS: clear to auscultation  HEART: regular rate & rhythm, no murmurs, no gallops, S1 normal, S2 normal and physiological split S2 ABDOMEN:abdomen soft, normal bowel sounds and PEG in place BACK: Back symmetric, no curvature., No CVA tenderness EXTREMITIES:less then 2 second capillary refill, no joint deformities, effusion, or inflammation, no skin discoloration, no cyanosis  NEURO: alert & oriented x 3 with fluent speech, no focal motor/sensory deficits, in wheelchair  LABORATORY DATA: CBC    Component Value Date/Time   WBC 3.3 (L) 05/08/2015 1134   RBC 3.22 (L) 05/08/2015 1134   HGB 10.3 (L) 05/08/2015 1134   HCT 30.2 (L) 05/08/2015 1134   PLT 101 (L) 05/08/2015  1134   MCV 93.8 05/08/2015 1134   MCH 32.0 05/08/2015 1134   MCHC 34.1 05/08/2015 1134   RDW 13.8 05/08/2015 1134   LYMPHSABS 0.3 (L) 05/08/2015 1134   MONOABS 0.9 05/08/2015 1134   EOSABS 0.0 05/08/2015 1134   BASOSABS 0.0 05/08/2015 1134      Chemistry      Component Value Date/Time   NA 134 (L) 05/08/2015 1134   K 4.5 05/08/2015 1134   CL 101 05/08/2015 1134   CO2 24 05/08/2015 1134   BUN 16 05/08/2015 1134   CREATININE 1.20 07/02/2015 1412      Component Value Date/Time   CALCIUM 9.4 05/08/2015 1134   ALKPHOS 63 05/08/2015 1134   AST 17 05/08/2015 1134   ALT 18 05/08/2015 1134   BILITOT 0.2 (L) 05/08/2015 1134        PENDING LABS:   RADIOGRAPHIC STUDIES:  No results found.   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  S/P concomitant chemoXRT with Carboplatin/Paclitaxel (03/27/2015- 05/01/2015).  Follow-up EGD on 08/04/2015 demonstrates persistent disease.  Oncology history is updated.  Staging in [facility] problem list is updated.  EGD by Dr. Oneida Alar on 08/04/2015 demonstrated:  1. Partially obstructing, malignant esophageal tumor in the mid esophagus and in the distal esophagus, CLINICALLY IMPROVED since Jan 2017. -  2. 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  3. Mild gastritis  4. Intact gastrostomy with a patent G-tube present.  I personally reviewed and went over pathology results with the patient.  Pathology from EGD biopsy was positive for residual squamous cell carcinoma.  Patient was last seen on 07/27/2015 due to noncompliance with follow-up appointment on 08/11/2015.  His weight is down >10 lbs since July 2017.  Given his performance status he is not a candidate  for further treatment therapy, systemically.  As a result, we discussed palliative interventions and the recruitment of Hospice to assist in his care moving forward.  The patient and daughter are agreeable to this option.  We had a long conversation regarding Hospice vs Chemotherapy.  The patient is aware that chemotherapy may adversely affect quality of life and may not prove to be beneficial.  Patient education was given regarding Hospice and the services they provide.  The patient understands that studies report that early enrollment in Hospice actually allows the patient to live longer compared  to those who enroll in Hospice nearer to end of life.  Hospice will allow the patient to stay at home [facility]) at end of life or go to a facility for end of life care.  Hospice provides the patient with a team of providers to help with care including physicians, nurses, aids, chaplains, and social workers.  Hospice's goal is to keep patients out of the hospital and and comfortable by controlling symptoms with medications.  The patient is certainly Hospice appropriate with a life expectancy of less than 6 months, more likely 3-6 months.    Return PRN.  Greater than 40 minutes was spent in face to face encounter and 55 minutes on total encounter.  More than 50% of the time spent with the patient was utilized for counseling and coordination of care.    ORDERS PLACED FOR THIS ENCOUNTER: No orders of the defined types were placed in this encounter.   MEDICATIONS PRESCRIBED THIS ENCOUNTER: No orders of the defined types were placed in this encounter.   THERAPY PLAN:  Referral to [place].  All questions were answered. The patient knows to call the clinic with any problems, questions or concerns. We can certainly see the patient much sooner if necessary.  Patient and plan discussed with Dr. Ancil Linsey and she is in agreement with the aforementioned.   This note is electronically signed  by: Doy Mince 10/23/2015 5:22 PM

## 2015-10-23 NOTE — Assessment & Plan Note (Addendum)
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  S/P concomitant chemoXRT with Carboplatin/Paclitaxel (03/27/2015- 05/01/2015).  Follow-up EGD on 08/04/2015 demonstrates persistent disease.  Oncology history is updated.  Staging in [facility] problem list is updated.  EGD by Dr. Oneida Alar on 08/04/2015 demonstrated:  1. Partially obstructing, malignant esophageal tumor in the mid esophagus and in the distal esophagus, CLINICALLY IMPROVED since Jan 2017. -  2. 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  3. Mild gastritis  4. Intact gastrostomy with a patent G-tube present.  I personally reviewed and went over pathology results with the patient.  Pathology from EGD biopsy was positive for residual squamous cell carcinoma.  Patient was last seen on 07/27/2015 due to noncompliance with follow-up appointment on 08/11/2015.  His weight is down >10 lbs since July 2017.  Given his performance status he is not a candidate for further treatment therapy, systemically.  As a result, we discussed palliative interventions and the recruitment of Hospice to assist in his care moving forward.  The patient and daughter are agreeable to this option.  We had a long conversation regarding Hospice vs Chemotherapy.  The patient is aware that chemotherapy may adversely affect quality of life and may not prove to be beneficial.  Patient education was given regarding Hospice and the services they provide.  The patient understands that studies report that early enrollment in Hospice actually allows the patient to live longer compared to those who enroll in Hospice nearer to end of life.  Hospice will allow the patient to stay at home [facility]) at end of life or go to a facility for end of life care.  Hospice provides the  patient with a team of providers to help with care including physicians, nurses, aids, chaplains, and social workers.  Hospice's goal is to keep patients out of the hospital and and comfortable by controlling symptoms with medications.  The patient is certainly Hospice appropriate with a life expectancy of less than 6 months, more likely 3-6 months.    Return PRN.  Greater than 40 minutes was spent in face to face encounter and 55 minutes on total encounter.  More than 50% of the time spent with the patient was utilized for counseling and coordination of care.

## 2015-10-23 NOTE — Progress Notes (Unsigned)
Referral sent to [facility].  Records faxed on 10/13

## 2015-10-23 NOTE — Patient Instructions (Addendum)
[place] at [facility] Discharge Instructions  RECOMMENDATIONS MADE BY THE CONSULTANT AND ANY TEST RESULTS WILL BE SENT TO YOUR REFERRING PHYSICIAN.  You saw [name], PA-C, today. You are being referred to Hospice.  Thank you for choosing [place] at [facility] to provide your oncology and hematology care.  To afford each patient quality time with our provider, please arrive at least 15 minutes before your scheduled appointment time.   Beginning January 23rd 2017 lab work for the [facility] will be done in the  Main lab at [facility] on 1st floor. If you have a lab appointment with the [facility] please come in thru the  Main Entrance and check in at the main information desk  You need to re-schedule your appointment should you arrive 10 or more minutes late.  We strive to give you quality time with our providers, and arriving late affects you and other patients whose appointments are after yours.  Also, if you no show three or more times for appointments you may be dismissed from the clinic at the providers discretion.     Again, thank you for choosing [facility].  Our hope is that these requests will decrease the amount of time that you wait before being seen by our physicians.       _____________________________________________________________  Should you have questions after your visit to [facility], please contact our office at [phone] between the hours of 8:30 a.m. and 4:30 p.m.  Voicemails left after 4:30 p.m. will not be returned until the following business day.  For prescription refill requests, have your pharmacy contact our office.         Resources For Cancer Patients and their Caregivers ? American Cancer Society: Can assist with transportation, wigs, general needs, runs Look Good Feel Better.        (865)610-4632 ? Cancer Care: Provides financial assistance, online support  groups, medication/co-pay assistance.  1-800-813-HOPE (845)201-6707) ? [place] Assists [place] cancer patients and their families through emotional , educational and financial support.  419 394 5022 ? [place] DSS Where to apply for food stamps, Medicaid and utility assistance. (502)669-3111 ? [facility]: Transportation to medical appointments. 218 305 4725 ? Social Security Administration: May apply for disability if have a Stage IV cancer. (707)487-4575 (703) 007-2746 ? [employer], Disability and Transit Services: Assists with nutrition, care and transit needs. [facility] Support Programs: @10RELATIVEDAYS @ > Cancer Support Group  2nd Tuesday of the month 1pm-2pm, Journey Room  > Creative Journey  3rd Tuesday of the month 1130am-1pm, Journey Room  > Look Good Feel Better  1st Wednesday of the month 10am-12 noon, Journey Room (Call [facility] to register [phone])

## 2016-01-06 ENCOUNTER — Encounter: Payer: Self-pay | Admitting: Gastroenterology

## 2016-02-11 DEATH — deceased

## 2017-09-03 IMAGING — XA IR US GUIDE VASC ACCESS RIGHT
1 series · 1 of 1 positions shown · non-contrast
Comparison: none

CLINICAL DATA: Esophageal carcinoma, needs long-term venous access
for chemotherapy.
TECHNIQUE: The procedure, risks, benefits, and alternatives were explained to
the patient. Questions regarding the procedure were encouraged and
answered. The patient understands and consents to the procedure. As
antibiotic prophylaxis, cefazolin 2 g was ordered pre-procedure and
administered intravenously within one hour of incision. Patency of
the right IJ vein was confirmed with ultrasound with image
documentation. An appropriate skin site was determined. Skin site
was marked. Region was prepped using maximum barrier technique
including cap and mask, sterile gown, sterile gloves, large sterile
sheet, and Chlorhexidine as cutaneous antisepsis. The region was
infiltrated locally with 1% lidocaine. Under real-time ultrasound
guidance, the right IJ vein was accessed with a 21 gauge
micropuncture needle; the needle tip within the vein was confirmed
with ultrasound image documentation. Needle was exchanged over a 018
guidewire for transitional dilator which allowed passage of the
Benson wire into the IVC. Over this, the transitional dilator was
exchanged for a 5 French MPA catheter. A small incision was made on
the right anterior chest wall and a subcutaneous pocket fashioned.
The power-injectable port was positioned and its catheter tunneled
to the right IJ dermatotomy site. The MPA catheter was exchanged
over an Amplatz wire for a peel-away sheath, through which the port
catheter, which had been trimmed to the appropriate length, was
advanced and positioned under fluoroscopy with its tip at the
cavoatrial junction. Spot chest radiograph confirms good catheter
position and no pneumothorax. The pocket was closed with deep
interrupted and subcuticular continuous 3-0 Monocryl sutures. The
port was flushed per protocol. The incisions were covered with
Dermabond then covered with a sterile dressing.

COMPLICATIONS:
COMPLICATIONS
None immediate

[Series 300: line placements · 1 of 1 slices shown]
[im 1/1]
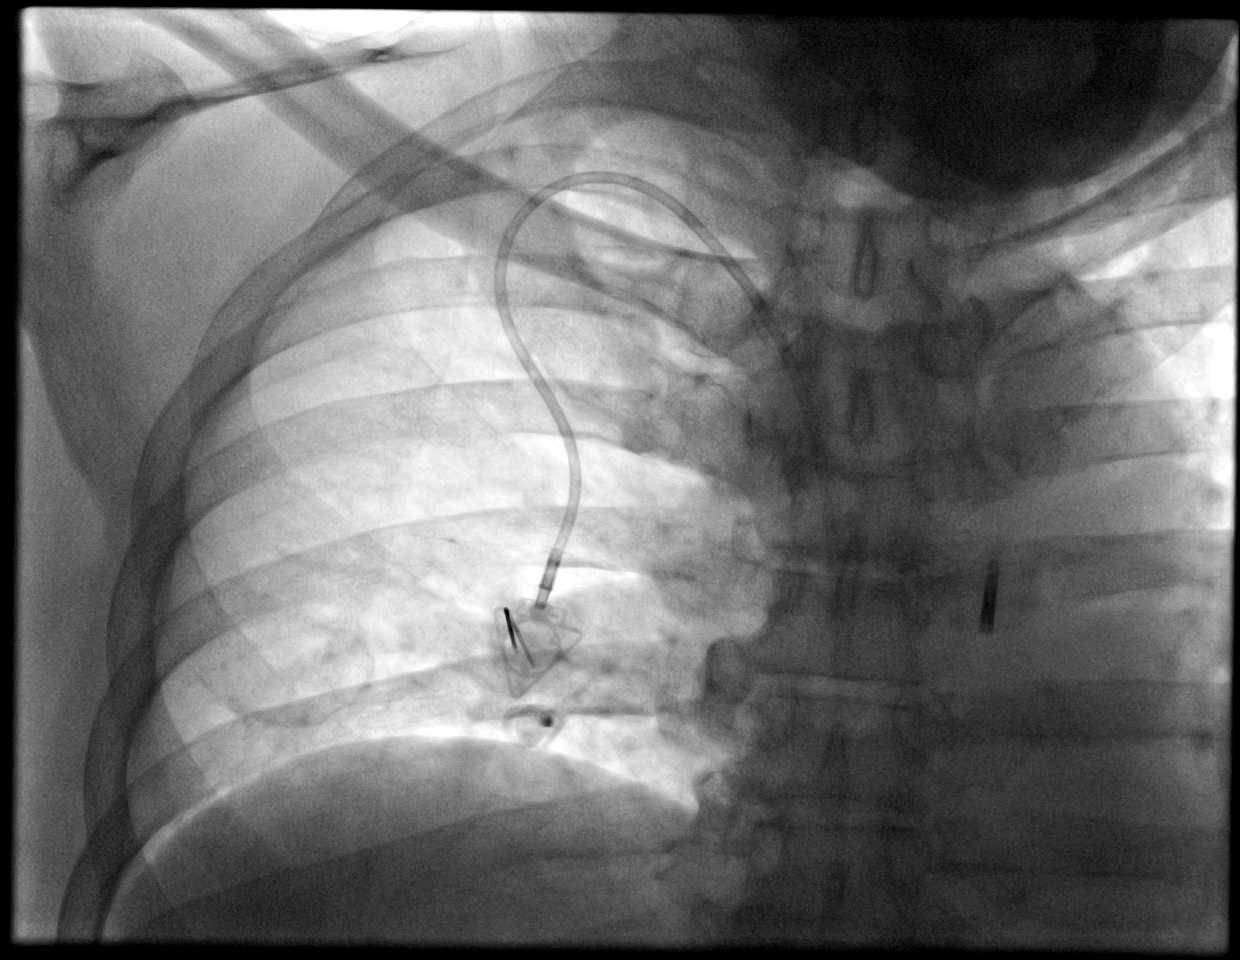

[1 of 1 positions shown; findings below may reference images not displayed]

EXAM:
TUNNELED PORT CATHETER PLACEMENT WITH ULTRASOUND AND FLUOROSCOPIC
GUIDANCE

FLUOROSCOPY TIME:  54 seconds, 8 mGy

ANESTHESIA/SEDATION:
Intravenous Fentanyl and Versed were administered as conscious
sedation during continuous monitoring of the patient's level of
consciousness and physiological / cardiorespiratory status by the
radiology RN, with a total moderate sedation time of 15 minutes.
IMPRESSION: Technically successful right IJ power-injectable port catheter
placement. Ready for routine use.

## 2017-09-24 IMAGING — XA IR PERC PLACEMENT GASTROSTOMY
2 series · 10 of 10 positions shown · non-contrast
Comparison: Radiation planning CT scan - 03/12/2015

INDICATION: History of esophageal cancer. In need of gastrostomy tube placement
for enteric nutrition supplementation.

EXAM:
PULL TROUGH GASTROSTOMY TUBE PLACEMENT

[Series 1: care single · 1 of 1 slices shown]
[im 1/1]
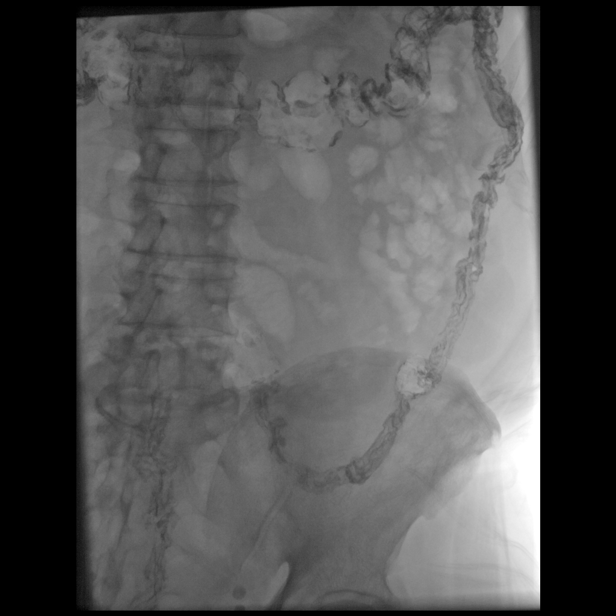

[Series 300: tube placements · 9 of 9 slices shown]
[im 1/9]
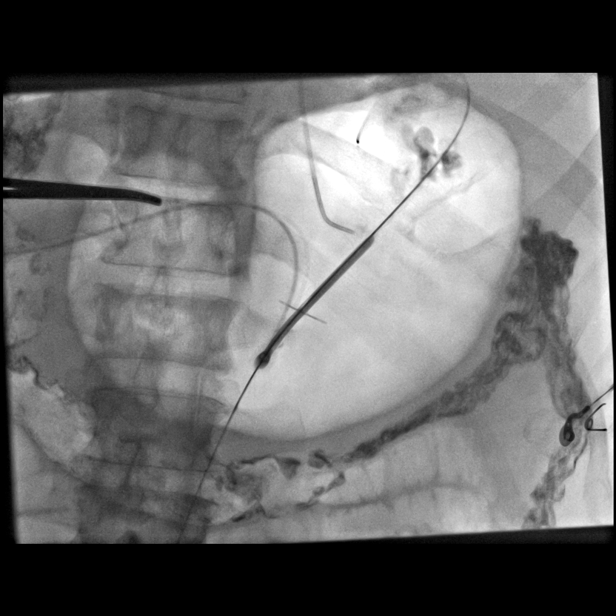
[im 2/9]
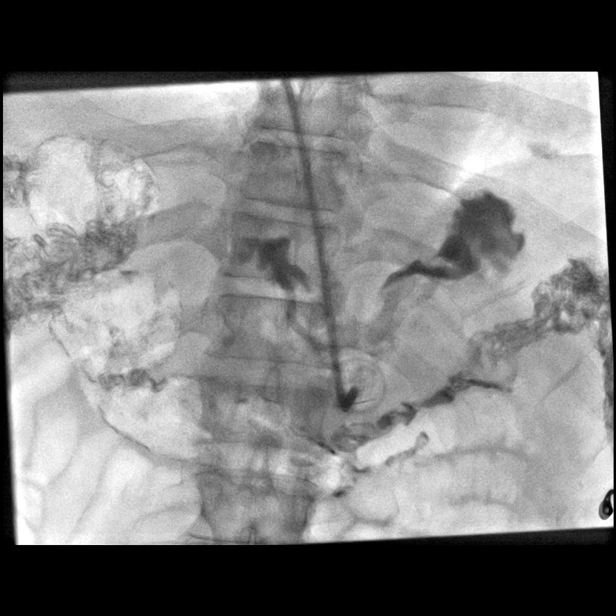
[im 3/9]
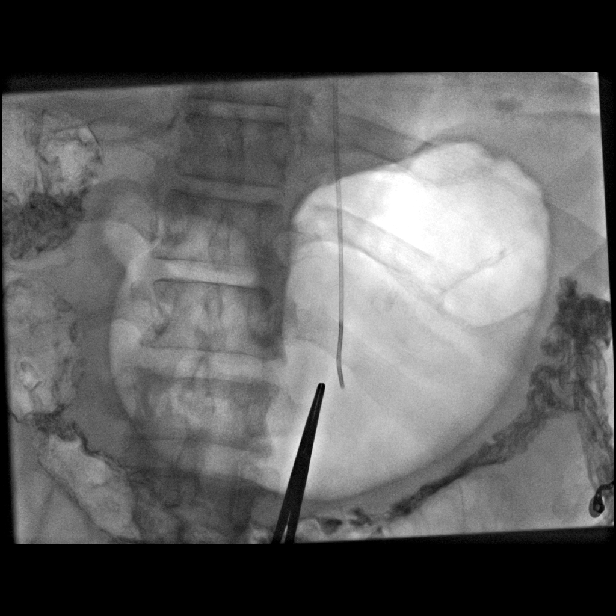
[im 4/9]
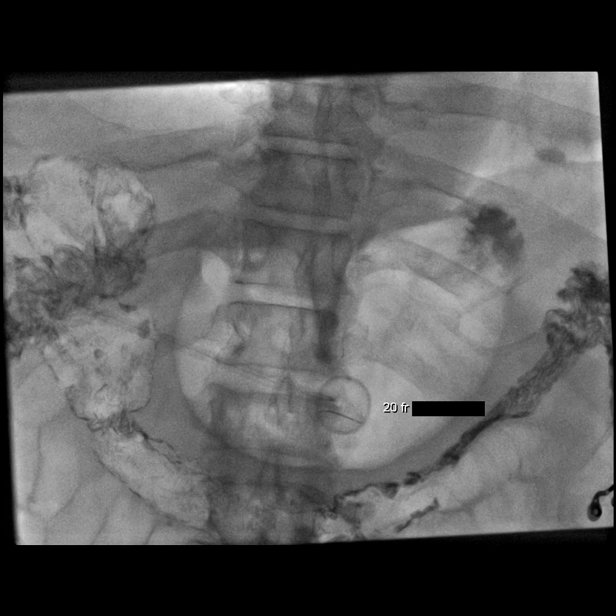
[im 5/9]
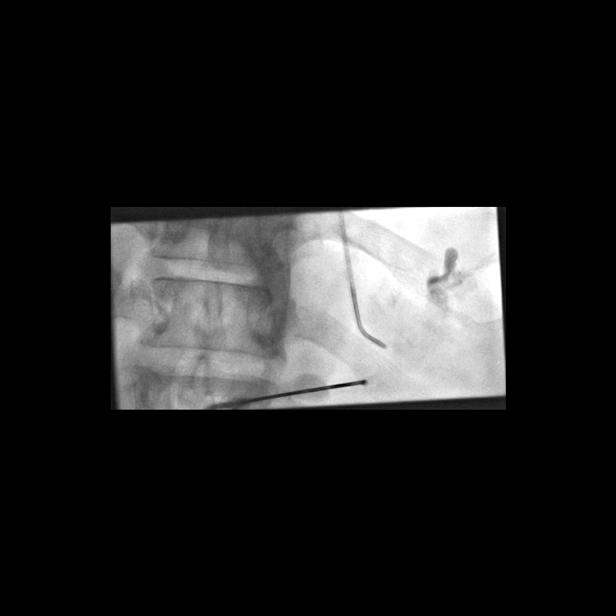
[im 6/9]
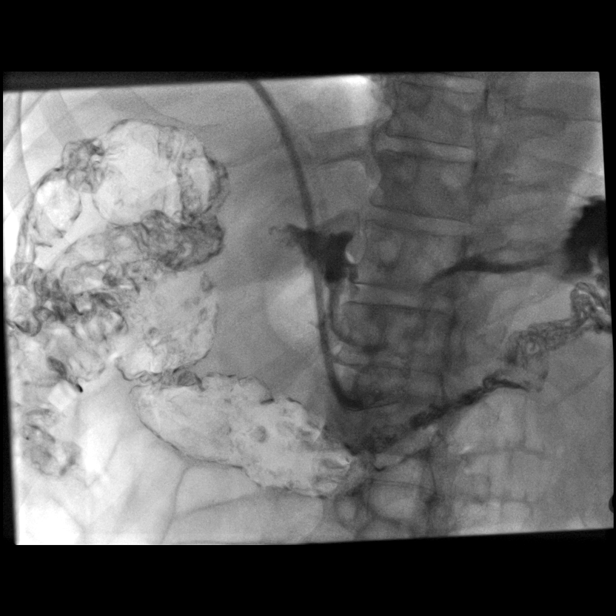
[im 7/9]
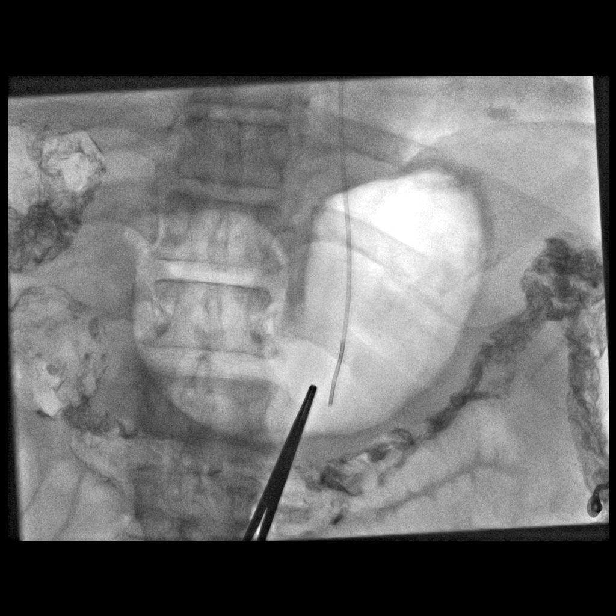
[im 8/9]
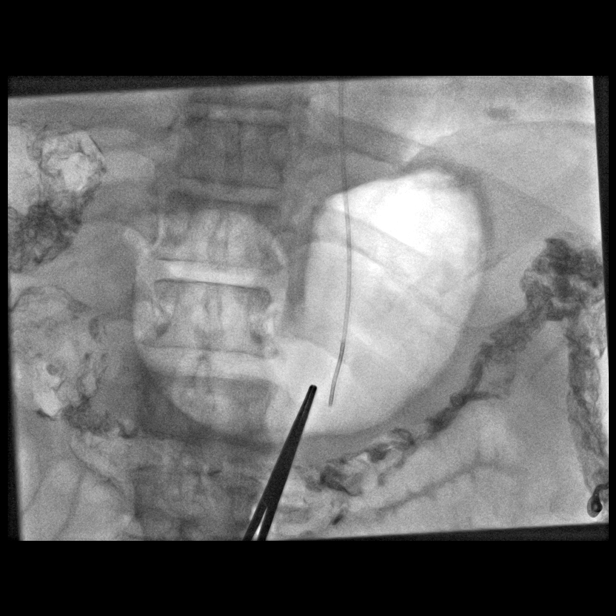
[im 9/9]
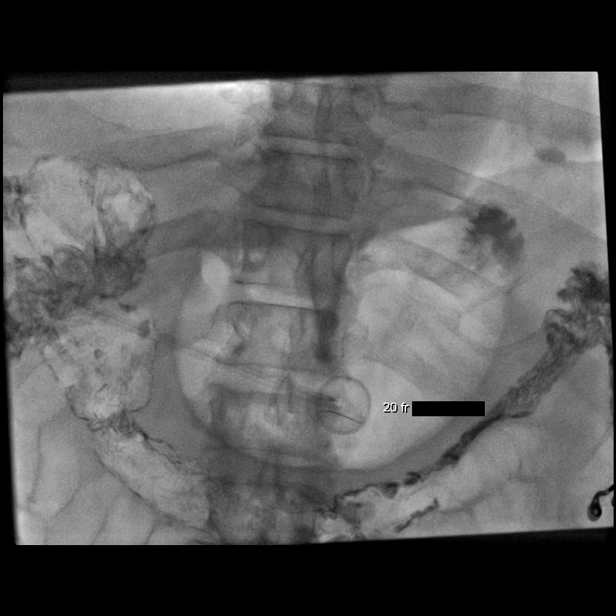

[10 of 10 positions shown; findings below may reference images not displayed]

MEDICATIONS:
Ancef 2 gm IV; Antibiotics were administered within 1 hour of the
procedure.

Glucagon 1 mg IV

CONTRAST:  10 mL of Omnipaque 300 administered into the gastric
lumen.

ANESTHESIA/SEDATION:
Moderate (conscious) sedation was employed during this procedure. A
total of Versed 2 mg and Fentanyl 75 mcg was administered
intravenously.

Moderate Sedation Time: 15 minutes. The patient's level of
consciousness and vital signs were monitored continuously by
radiology nursing throughout the procedure under my direct
supervision.

FLUOROSCOPY TIME:  3 minutes 36 seconds (55 mGy)

COMPLICATIONS:
None immediate.

PROCEDURE:
Informed written consent was obtained from the patient following
explanation of the procedure, risks, benefits and alternatives. A
time out was performed prior to the initiation of the procedure.
Ultrasound scanning was performed to demarcate the edge of the left
lobe of the liver. Maximal barrier sterile technique utilized
including caps, mask, sterile gowns, sterile gloves, large sterile
drape, hand hygiene and Betadine prep.

The left upper quadrant was sterilely prepped and draped. An oral
gastric catheter was inserted into the stomach under fluoroscopy.
The existing nasogastric feeding tube was removed. The left costal
margin and air and barium opacified transverse colon were identified
and avoided. Air was injected into the stomach for insufflation and
visualization under fluoroscopy. Under sterile conditions a 17 gauge
trocar needle was utilized to access the stomach percutaneously
beneath the left subcostal margin after the overlying soft tissues
were anesthetized with 1% Lidocaine with epinephrine. Needle
position was confirmed within the stomach with aspiration of air and
injection of small amount of contrast. A single T tack was deployed
for gastropexy. Over an Amplatz guide wire, a 9-French sheath was
inserted into the stomach. A snare device was utilized to capture
the oral gastric catheter. The snare device was pulled retrograde
from the stomach up the esophagus and out the oropharynx. The
20-French pull-through gastrostomy was connected to the snare device
and pulled antegrade through the oropharynx down the esophagus into
the stomach and then through the percutaneous tract external to the
patient. The gastrostomy was assembled externally. Contrast
injection confirms position in the stomach. Several spot
radiographic images were obtained in various obliquities for
documentation. The patient tolerated procedure well without
immediate post procedural complication.
FINDINGS: After successful fluoroscopic guided placement, the gastrostomy tube
is appropriately positioned with internal disc against the ventral
aspect of the gastric lumen.
IMPRESSION: Successful fluoroscopic insertion of a 20-French pull-through
gastrostomy tube.

The gastrostomy may be used immediately for medication
administration and in 24 hrs for the initiation of feeds.

## 2017-12-25 IMAGING — CT CT HEAD WO/W CM
3 of 5 series · 14 of 47 positions shown, 16 images · IV contrast (isovue)
Comparison: MRI brain 09/06/2012.

CLINICAL DATA: Headache and blurred vision. Squamous cell carcinoma
of the esophagus. Hypertension. Previous stroke. Confusion.

EXAM:
CT HEAD WITHOUT AND WITH CONTRAST
TECHNIQUE: Contiguous axial images were obtained from the base of the skull
through the vertex without and with intravenous contrast
CONTRAST:  Isovue 370, 75 mL.

[Series 2: head w/o · axial · non-contrast · 0.42mm/px · z∈[+68,+183]mm · 8 of 31 slices shown, 10 images]
[im 4/31  brain]
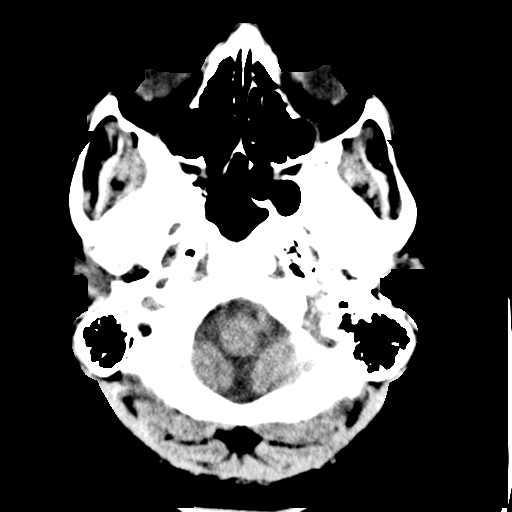
[im 4/31  bone]
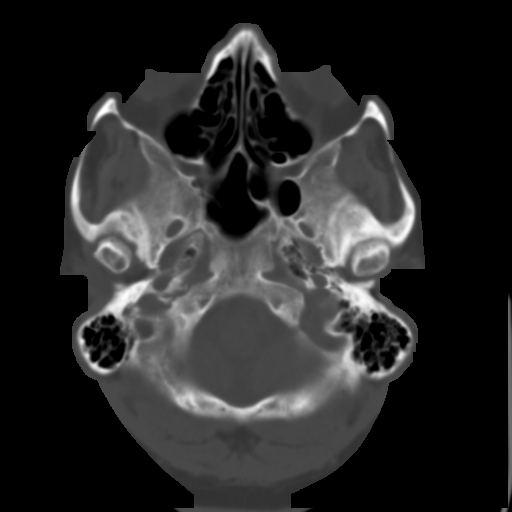
[im 7/31  brain]
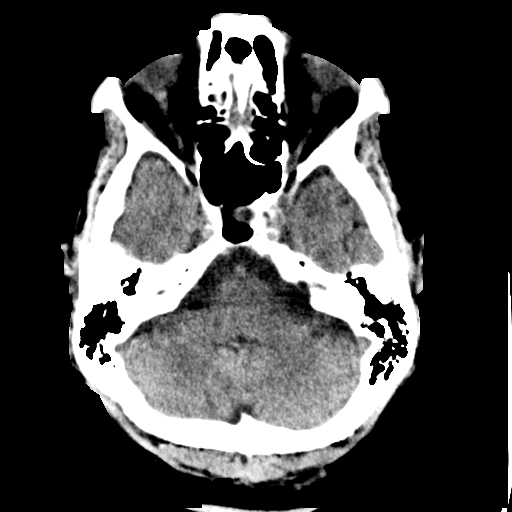
[im 11/31  brain]
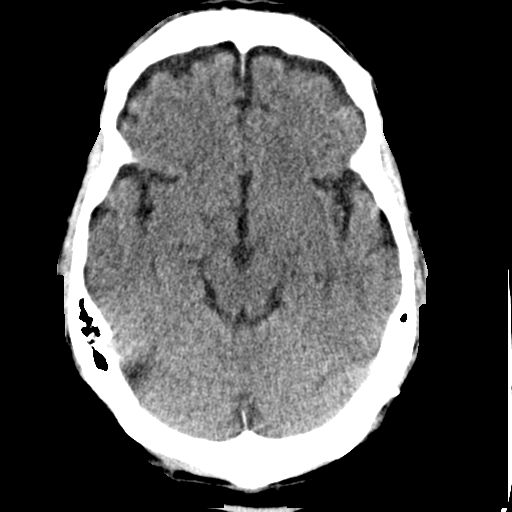
[im 14/31  brain]
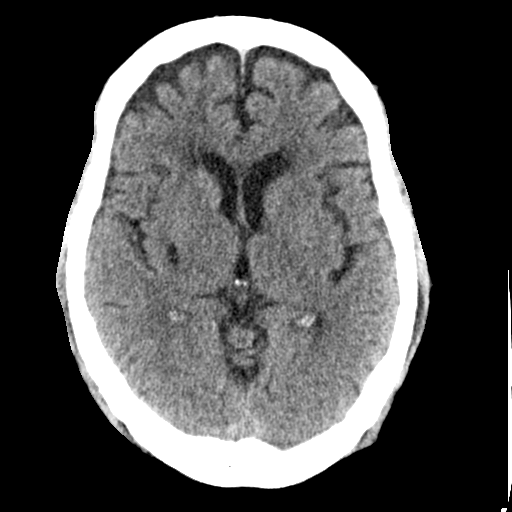
[im 17/31  brain]
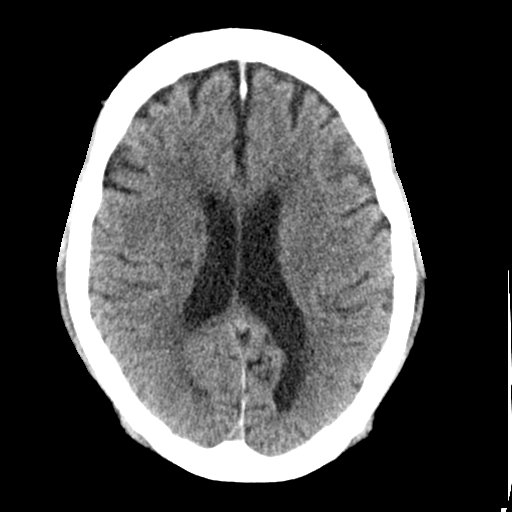
[im 17/31  bone]
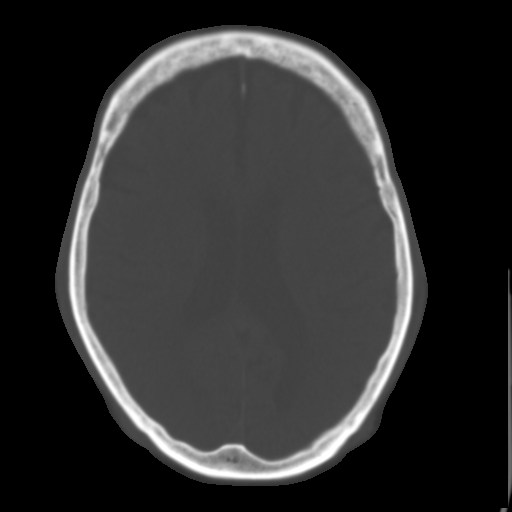
[im 21/31  brain]
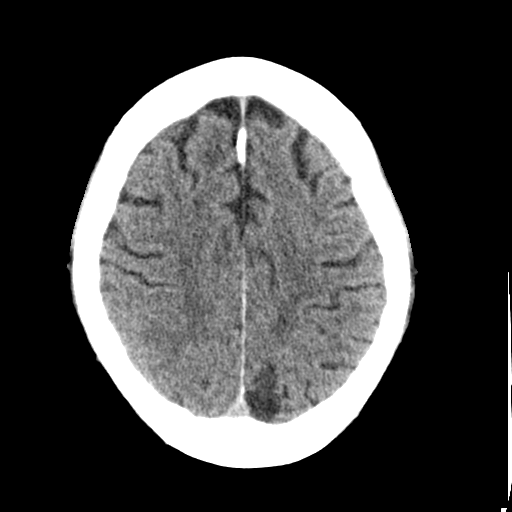
[im 24/31  brain]
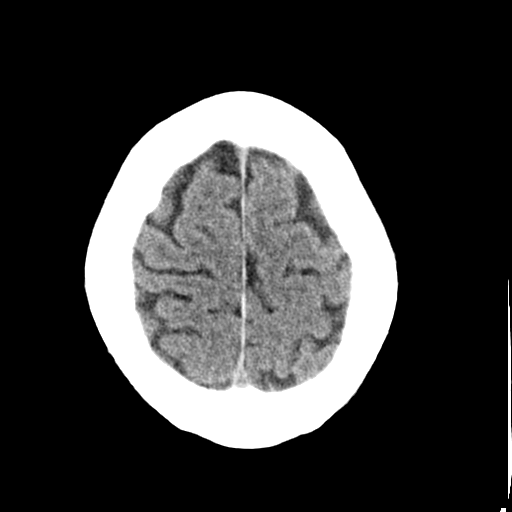
[im 27/31  brain]
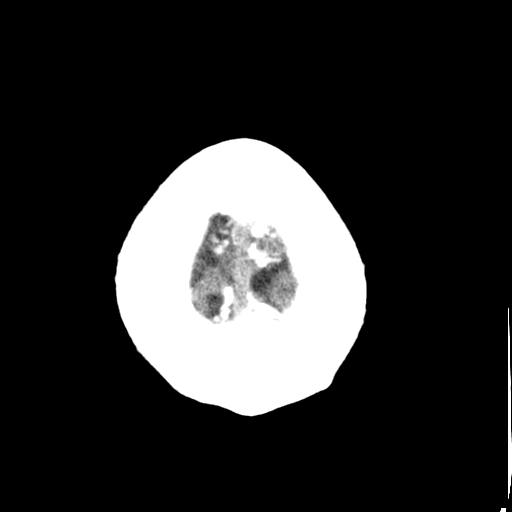

[Series 5: coronal with contrast · coronal · 0.31mm/px · 3 of 69 slices shown]
[im 23/69  brain]
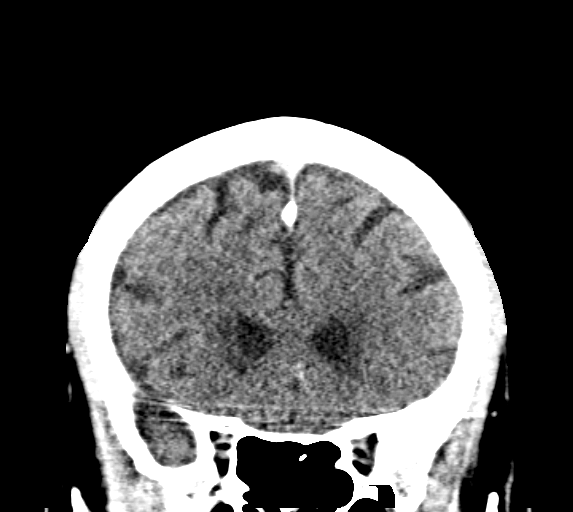
[im 31/69  brain]
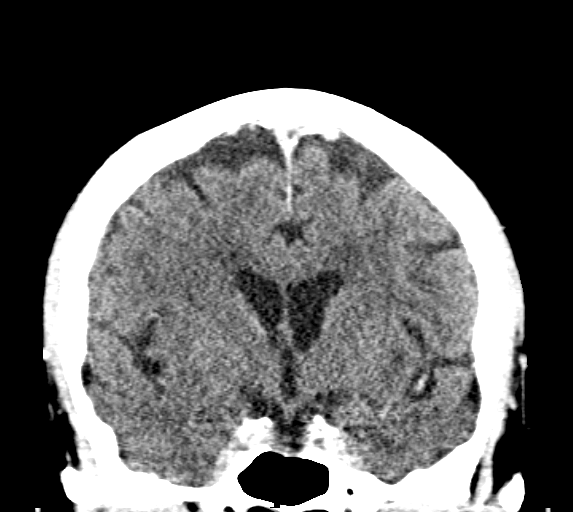
[im 38/69  brain]
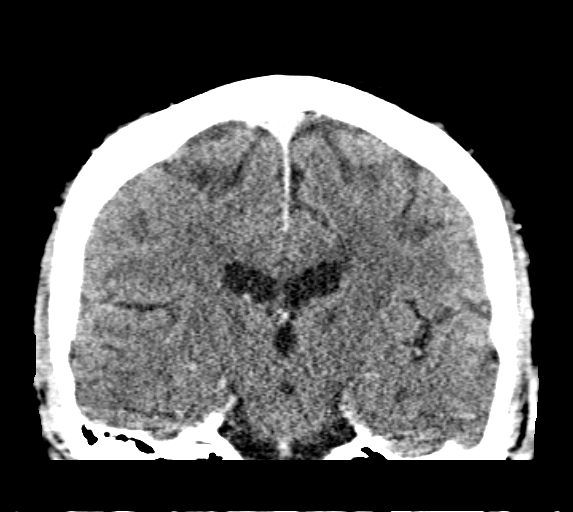

[Series 6: sagittal with contrast · sagittal · 0.32mm/px · 3 of 60 slices shown]
[im 20/60  brain]
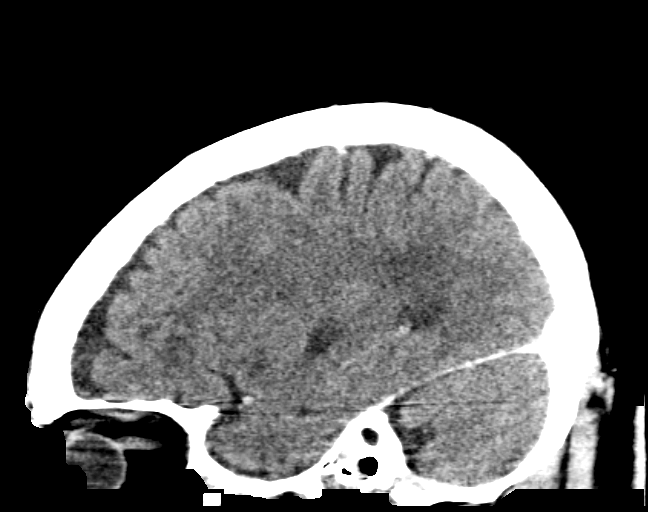
[im 30/60  brain]
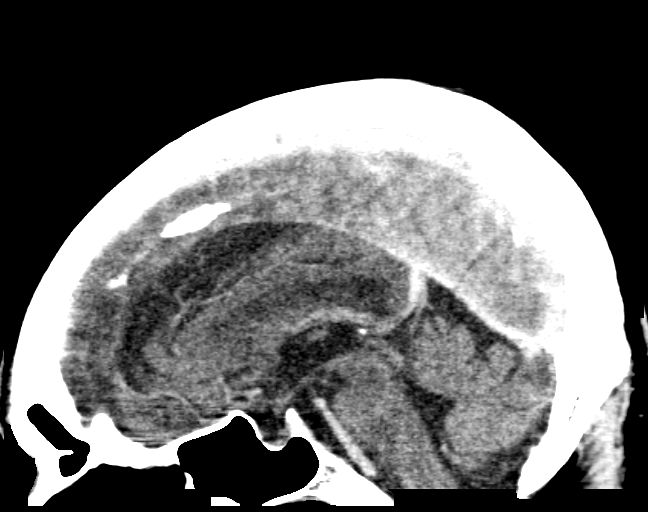
[im 40/60  brain]
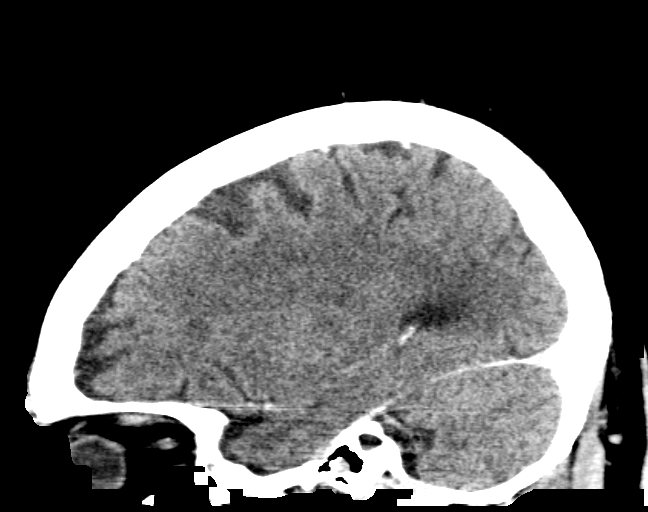

[14 of 47 positions shown; findings below may reference images not displayed]

FINDINGS: No evidence for acute infarction, hemorrhage, mass lesion,
hydrocephalus, or extra-axial fluid. Mild atrophy. Hypoattenuation
of white matter representing chronic microvascular ischemic change.

There is a remote deep white matter infarct on the RIGHT which was
acute in 6203 a well-defined area of brain substance loss is
observed, greatest in the posterior lentiform nucleus. In the LEFT
parietal parasagittal region, there is also an area of brain
substance loss, affecting cortex and white matter cut no associated
enhancement, LEFT PCA territory, not involved the occipital lobe,
which was not present previously, but nevertheless appears chronic.

The LEFT thalamus,

In the LEFT thalamus there is an ill-defined hypodensity,
approximately 1 cm in size, which could represent an acute or
subacute lacunar infarct. Smaller hypodensity less convincing on the
RIGHT, also in the thalamus uncertain significance. No associated
enhancement with either. If no contraindications, consider MRI brain
for further evaluation.

No abnormal postcontrast enhancement to suggest intracranial
metastatic disease. There is no osseous destructive lesion. No sinus
or mastoid air fluid level. BILATERAL cataract extraction without
orbital mass. Chronic vascular calcification affects the carotid
arteries primarily.
IMPRESSION: Chronic RIGHT basal ganglia/ centrum semiovale, and chronic LEFT
parasagittal parietal infarcts.

No intracranial metastatic disease is evident.

Ill-defined hypodensities in the LEFT greater than RIGHT thalami.
Correlate clinically for acute/subacute lacunar infarcts. Consider
MRI if no contraindications.
